# Patient Record
Sex: Female | Born: 1980
Health system: Southern US, Community
[De-identification: ages and names within clinical notes are randomized; demographics above are authoritative.]

## PROBLEM LIST (undated history)

## (undated) ENCOUNTER — Inpatient Hospital Stay (HOSPITAL_COMMUNITY): Payer: 59

## (undated) DIAGNOSIS — G8929 Other chronic pain: Secondary | ICD-10-CM

## (undated) DIAGNOSIS — F32A Depression, unspecified: Secondary | ICD-10-CM

## (undated) DIAGNOSIS — N883 Incompetence of cervix uteri: Secondary | ICD-10-CM

## (undated) DIAGNOSIS — M545 Low back pain: Secondary | ICD-10-CM

## (undated) DIAGNOSIS — G4733 Obstructive sleep apnea (adult) (pediatric): Secondary | ICD-10-CM

## (undated) DIAGNOSIS — L503 Dermatographic urticaria: Secondary | ICD-10-CM

## (undated) DIAGNOSIS — L709 Acne, unspecified: Secondary | ICD-10-CM

## (undated) DIAGNOSIS — R3129 Other microscopic hematuria: Secondary | ICD-10-CM

## (undated) DIAGNOSIS — F329 Major depressive disorder, single episode, unspecified: Secondary | ICD-10-CM

## (undated) DIAGNOSIS — Z6841 Body Mass Index (BMI) 40.0 and over, adult: Secondary | ICD-10-CM

## (undated) DIAGNOSIS — D509 Iron deficiency anemia, unspecified: Secondary | ICD-10-CM

## (undated) DIAGNOSIS — N840 Polyp of corpus uteri: Secondary | ICD-10-CM

## (undated) DIAGNOSIS — K219 Gastro-esophageal reflux disease without esophagitis: Secondary | ICD-10-CM

## (undated) DIAGNOSIS — B001 Herpesviral vesicular dermatitis: Secondary | ICD-10-CM

## (undated) DIAGNOSIS — J309 Allergic rhinitis, unspecified: Secondary | ICD-10-CM

## (undated) DIAGNOSIS — N979 Female infertility, unspecified: Secondary | ICD-10-CM

## (undated) DIAGNOSIS — H9193 Unspecified hearing loss, bilateral: Secondary | ICD-10-CM

## (undated) HISTORY — DX: Dermatographic urticaria: L50.3

## (undated) HISTORY — DX: Major depressive disorder, single episode, unspecified: F32.9

## (undated) HISTORY — DX: Morbid (severe) obesity due to excess calories: E66.01

## (undated) HISTORY — DX: Herpesviral vesicular dermatitis: B00.1

## (undated) HISTORY — DX: Low back pain: M54.5

## (undated) HISTORY — DX: Gastro-esophageal reflux disease without esophagitis: K21.9

## (undated) HISTORY — DX: Body Mass Index (BMI) 40.0 and over, adult: Z684

## (undated) HISTORY — DX: Acne, unspecified: L70.9

## (undated) HISTORY — DX: Other chronic pain: G89.29

## (undated) HISTORY — DX: Allergic rhinitis, unspecified: J30.9

## (undated) HISTORY — DX: Other microscopic hematuria: R31.29

---

## 2013-12-02 DIAGNOSIS — K219 Gastro-esophageal reflux disease without esophagitis: Secondary | ICD-10-CM | POA: Insufficient documentation

## 2013-12-02 DIAGNOSIS — B009 Herpesviral infection, unspecified: Secondary | ICD-10-CM | POA: Insufficient documentation

## 2013-12-02 HISTORY — DX: Gastro-esophageal reflux disease without esophagitis: K21.9

## 2014-11-25 DIAGNOSIS — F329 Major depressive disorder, single episode, unspecified: Secondary | ICD-10-CM

## 2014-11-25 DIAGNOSIS — F32A Depression, unspecified: Secondary | ICD-10-CM

## 2014-11-25 HISTORY — DX: Major depressive disorder, single episode, unspecified: F32.9

## 2014-11-25 HISTORY — DX: Depression, unspecified: F32.A

## 2016-11-02 DIAGNOSIS — J309 Allergic rhinitis, unspecified: Secondary | ICD-10-CM

## 2016-11-02 DIAGNOSIS — R3129 Other microscopic hematuria: Secondary | ICD-10-CM | POA: Insufficient documentation

## 2016-11-02 DIAGNOSIS — L503 Dermatographic urticaria: Secondary | ICD-10-CM

## 2016-11-02 DIAGNOSIS — E559 Vitamin D deficiency, unspecified: Secondary | ICD-10-CM | POA: Insufficient documentation

## 2016-11-02 DIAGNOSIS — Z30431 Encounter for routine checking of intrauterine contraceptive device: Secondary | ICD-10-CM | POA: Insufficient documentation

## 2016-11-02 DIAGNOSIS — J3089 Other allergic rhinitis: Secondary | ICD-10-CM | POA: Insufficient documentation

## 2016-11-02 DIAGNOSIS — Z6841 Body Mass Index (BMI) 40.0 and over, adult: Secondary | ICD-10-CM

## 2016-11-02 DIAGNOSIS — B001 Herpesviral vesicular dermatitis: Secondary | ICD-10-CM

## 2016-11-02 DIAGNOSIS — L709 Acne, unspecified: Secondary | ICD-10-CM

## 2016-11-02 HISTORY — DX: Other microscopic hematuria: R31.29

## 2016-11-02 HISTORY — DX: Herpesviral vesicular dermatitis: B00.1

## 2016-11-02 HISTORY — DX: Allergic rhinitis, unspecified: J30.9

## 2016-11-02 HISTORY — DX: Acne, unspecified: L70.9

## 2016-11-02 HISTORY — DX: Dermatographic urticaria: L50.3

## 2016-11-02 HISTORY — DX: Morbid (severe) obesity due to excess calories: E66.01

## 2017-01-03 DIAGNOSIS — G8929 Other chronic pain: Secondary | ICD-10-CM | POA: Insufficient documentation

## 2017-01-03 DIAGNOSIS — R0683 Snoring: Secondary | ICD-10-CM | POA: Insufficient documentation

## 2017-01-03 DIAGNOSIS — M545 Low back pain, unspecified: Secondary | ICD-10-CM

## 2017-01-03 HISTORY — DX: Low back pain, unspecified: M54.50

## 2018-03-06 DIAGNOSIS — M9901 Segmental and somatic dysfunction of cervical region: Secondary | ICD-10-CM | POA: Diagnosis not present

## 2018-03-06 DIAGNOSIS — M542 Cervicalgia: Secondary | ICD-10-CM | POA: Diagnosis not present

## 2018-03-06 DIAGNOSIS — M9903 Segmental and somatic dysfunction of lumbar region: Secondary | ICD-10-CM | POA: Diagnosis not present

## 2018-04-05 DIAGNOSIS — M9901 Segmental and somatic dysfunction of cervical region: Secondary | ICD-10-CM | POA: Diagnosis not present

## 2018-04-05 DIAGNOSIS — M542 Cervicalgia: Secondary | ICD-10-CM | POA: Diagnosis not present

## 2018-04-05 DIAGNOSIS — M9903 Segmental and somatic dysfunction of lumbar region: Secondary | ICD-10-CM | POA: Diagnosis not present

## 2018-07-02 ENCOUNTER — Ambulatory Visit (INDEPENDENT_AMBULATORY_CARE_PROVIDER_SITE_OTHER): Payer: 59 | Admitting: Osteopathic Medicine

## 2018-07-02 ENCOUNTER — Encounter: Payer: Self-pay | Admitting: Osteopathic Medicine

## 2018-07-02 VITALS — BP 141/73 | HR 59 | Temp 98.4°F | Ht 65.0 in | Wt 281.5 lb

## 2018-07-02 DIAGNOSIS — R21 Rash and other nonspecific skin eruption: Secondary | ICD-10-CM | POA: Diagnosis not present

## 2018-07-02 DIAGNOSIS — L83 Acanthosis nigricans: Secondary | ICD-10-CM

## 2018-07-02 DIAGNOSIS — E559 Vitamin D deficiency, unspecified: Secondary | ICD-10-CM | POA: Diagnosis not present

## 2018-07-02 DIAGNOSIS — R002 Palpitations: Secondary | ICD-10-CM | POA: Diagnosis not present

## 2018-07-02 DIAGNOSIS — B359 Dermatophytosis, unspecified: Secondary | ICD-10-CM

## 2018-07-02 DIAGNOSIS — M545 Low back pain, unspecified: Secondary | ICD-10-CM

## 2018-07-02 DIAGNOSIS — G8929 Other chronic pain: Secondary | ICD-10-CM

## 2018-07-02 DIAGNOSIS — Z6841 Body Mass Index (BMI) 40.0 and over, adult: Secondary | ICD-10-CM

## 2018-07-02 DIAGNOSIS — R030 Elevated blood-pressure reading, without diagnosis of hypertension: Secondary | ICD-10-CM

## 2018-07-02 DIAGNOSIS — Z803 Family history of malignant neoplasm of breast: Secondary | ICD-10-CM

## 2018-07-02 MED ORDER — FLUCONAZOLE 150 MG PO TABS: 150.0000 mg | ORAL_TABLET | ORAL | 0 refills | Status: DC

## 2018-07-02 NOTE — Addendum Note (Signed)
Addended by: Maryla Morrow on: 07/02/2018 05:14 PM   Modules accepted: Orders

## 2018-07-02 NOTE — Progress Notes (Signed)
HPI: Debra [name] is a 37 y.o. female who  has a past medical history of Acne (11/02/2016), Allergic rhinitis (11/02/2016), Chronic low back pain without sciatica (01/03/2017), Cold sore (11/02/2016), Depressive disorder (11/25/2014), Dermatographia (11/02/2016), Esophageal reflux (12/02/2013), Microscopic hematuria (11/02/2016), and Morbid obesity with BMI of 40.0-44.9, adult ([place]) (11/02/2016).  she presents to [facility] today, 07/02/18,  for chief complaint of: New to establish Rash concerns  Weight concerns    Palpitations:  Occasional heart racing for about 10-30 seconds, sometimes w/ exertion but not always and not w/ every exertional effort, no hx heart disease. No SOB/LOC, no dizziness. Happens maybe a few times per month.   Rash:  Dry, dark, scaly. On chest below breasts and back of neck, present for several months, not itchy or painful.   Weight gain:   Discuss options for management. Thinking of keto diet with her husband. Not too interested in meds at this time.   Skin on toe:  Cracking and raw. Better today than 3 weeks ago when she made the appt. On R pinky toe.   Brief review other medical history from patient and from records available:   IUD in place. Mirena placed around 04/2016. Planning on getting it taken out. G0. Planning for pregnancy. Married. Heterosexual cisgender.   (+)FH breast cancer in mother, diagnosed around 2008 at age 37. Unsure of genetics.   Hx depression: previously on Pristiq and liked this okay until insurance stopped covering it, also Wellbutrin, has been off this for years. Moods are good.   Allergies: taking Loratidine 30 mg  Cold sore: on Valtrex prn   GERD: taking Prevacid 30 mg   Snoring/Insomnia: no longer on ambien. Sleep study and neuro eval in 2018. Results borderline but she paid for her own CPAP and still using it and feels it's helping    Vit D Deficiency: would like this rechecked.    Back Pain: taking prn Soma, no longer on Flexeril, Mobic  Borderline BP: Looks like last visit with internal med / PCP was 01/2017. BP a bit elevated that visit 134/98. "Patient has been checking her blood pressure at home in the morning and in the evenings since her last visit and they have been averaging 117/80 8 in the morning and 126/90 in the evening. Most of her blood pressure readings are normal."  Last CMP ok 12/2016, CBC slightly elevated RBC, RDW, MPV, slightly decreased MCV, MCH. Vit D 29. UA contam, small blood no RBC.      Past medical, surgical, social and family history reviewed:  Patient Active Problem List   Diagnosis Date Noted  . Snoring 01/03/2017  . Chronic low back pain without sciatica 01/03/2017  . Vitamin D deficiency 11/02/2016  . Morbid obesity with BMI of 40.0-44.9, adult ([place]) 11/02/2016  . Microscopic hematuria 11/02/2016  . Dermatographia 11/02/2016  . Contraceptive, surveillance, intrauterine device 11/02/2016  . Cold sore 11/02/2016  . Allergic rhinitis 11/02/2016  . Acne 11/02/2016  . Depressive disorder 11/25/2014  . Herpes simplex 12/02/2013  . Esophageal reflux 12/02/2013   History reviewed. No pertinent surgical history.  Social History   Tobacco Use  . Smoking status: Never Smoker  . Smokeless tobacco: Never Used  Substance Use Topics  . Alcohol use: Yes    Comment: 1-5/WK    No family history on file.   Current medication list and allergy/intolerance information reviewed:    Current Outpatient Medications  Medication Sig Dispense Refill  . carisoprodol (SOMA) 250  MG tablet TK 1 T PO QID PRF 7 DAYS    . Cholecalciferol (VITAMIN D3 PO) Take by mouth.    . Ergocalciferol (VITAMIN D2 PO) Take by mouth.    . lansoprazole (PREVACID) 30 MG capsule Take by mouth.    . levonorgestrel (MIRENA, 52 MG,) 20 MCG/24HR IUD by Intrauterine route.    . valACYclovir (VALTREX) 1000 MG tablet Take by mouth.    Marland Kitchen    0   No current  facility-administered medications for this visit.     No Known Allergies    Review of Systems:  Constitutional:  No  fever, no chills, No recent illness, No unintentional weight changes. No significant fatigue. +night sweats on occasion,   HEENT: +headache, no vision change, no hearing change, No sore throat, No  sinus pressure  Cardiac: No  chest pain, No  pressure, +racing heart, No  Orthopnea  Respiratory:  No  shortness of breath. No  Cough  Gastrointestinal: No  abdominal pain, +heartburn, +nausea, No  vomiting,  No  blood in stool, No  diarrhea, No  constipation   Musculoskeletal: No new myalgia/arthralgia, +muscle/back/joint pain   Skin: +Rash, No other wounds/concerning lesions  Genitourinary: +incontinence, No  abnormal genital bleeding, No abnormal genital discharge  Hem/Onc: No  easy bruising/bleeding, No  abnormal lymph node  Endocrine: No cold intolerance,  +heat intolerance. No polyuria/polydipsia/polyphagia   Neurologic: No  weakness, No  dizziness  Psychiatric: No  concerns with depression at this time, No  concerns with anxiety, No sleep problems, No mood problems  Exam:  BP (!) 141/73 (BP Location: Left Arm, Patient Position: Sitting, Cuff Size: Large)   Pulse (!) 59   Temp 98.4 F (36.9 C) (Oral)   Ht '5\' 5"'$  (1.651 m)   Wt 281 lb 8 oz (127.7 kg)   BMI 46.84 kg/m   Constitutional: VS see above. General Appearance: alert, well-developed, well-nourished, NAD  Eyes: Normal lids and conjunctive, non-icteric sclera  Ears, Nose, Mouth, Throat: MMM, Normal external inspection ears/nares/mouth/lips/gums. TM normal bilaterally. Pharynx/tonsils no erythema, no exudate. Nasal mucosa normal.   Neck: No masses, trachea midline. No thyroid enlargement. No tenderness/mass appreciated. No lymphadenopathy  Respiratory: Normal respiratory effort. no wheeze, no rhonchi, no rales  Cardiovascular: S1/S2 normal, no murmur, no rub/gallop auscultated. RRR. No lower  extremity edema. Pedal pulse II/IV bilaterally DP and PT. No carotid bruit or JVD. No abdominal aortic bruit.  Gastrointestinal: Nontender, no masses. No hepatomegaly, no splenomegaly. No hernia appreciated. Bowel sounds normal. Rectal exam deferred.   Musculoskeletal: Gait normal. No clubbing/cyanosis of digits.   Neurological: Normal balance/coordination. No tremor. No cranial nerve deficit on limited exam. Motor and sensation intact and symmetric. Cerebellar reflexes intact.   Skin: warm, dry, intact. (+)dry patchy almost scaly rough slight hyperpigmentation below breasts and at nape of neck. No ulcer. No concerning nevi or subq nodules on limited exam.    Psychiatric: Normal judgment/insight. Normal mood and affect. Oriented x3.   EKG interpretation: Rate: 76 Rhythm: sinus but sinus arrhythmia No ST/T changes concerning for acute ischemia/infarct  Previous EKG none     ASSESSMENT/PLAN:   Rash and nonspecific skin eruption - Plan: fluconazole (DIFLUCAN) 150 MG tablet  Palpitations - sounds possible PVC or SVT runs, will get echo and holter unless labs give obvious cause   Vitamin D deficiency - Plan: VITAMIN D 25 Hydroxy (Vit-D Deficiency, Fractures)  BMI 45.0-49.9, adult (HCC) - Plan: CBC, COMPLETE METABOLIC PANEL WITH GFR, Lipid panel, TSH, Hemoglobin A1c  Chronic low back pain without sciatica, unspecified back pain laterality - recheck as needed   Tinea - Plan: fluconazole (DIFLUCAN) 150 MG tablet  Acanthosis nigricans - Plan: Hemoglobin A1c  Elevated blood pressure reading - better on recheck, will monitor     Patient Instructions   Ask mom about genetic testing for breast cancer - did she have testing for BRCA gene etc?   Bring home BP monitor to next visit   Try antifungal medications, babypowder to keep skin under breasts dry. If rash no better, will consider dermatology referral  See below for weight loss medications  Possible headache prevention  medications: amitriptyline, topiramate, propranolol, few others. Can also try daily magnesium supplement. Recommend Excedrin Migraine OTC for bad headache pain, no more than once or twice per week.    EKG looks ok, heart beat is a little irregular. Let's see how labs are looking - if all normal, would consider Holter monitor (48 hour heart rhythm monitoring) and echocardiogram (ultrasound of the heart)   Weight loss: important things to remember  It is hard work! You will have setbacks, but don't get discouraged. The goal is not short-term success, it is long-term health.   Looking at the numbers is important to track your progress and set goals, but how you are feeling and your overall health are the most important things! BMI and pounds and calories and miles logged aren't everything - they are tools to help [place] reach your goals.  You can do this!!!   Things to remember for exercise for weight loss:   Please note - I am not a certified personal trainer. I can present you with ideas and general workout goals, but an exercise program is largely up to you. Find something you can stick with, and something you enjoy!   As you progress in your exercise regimen think about gradually increasing the following, week by week:   intensity (how strenuous is your workout)  frequency (how often you are exercising)  duration (how many minutes at a time you are exercising)  Walking for 20 minutes a day is certainly better than nothing, but more strenuous exercise will develop better cardiovascular fitness.   interval training (high-intensity alternating with low-intensity, think walk/jog rather than just walk)  muscle strengthening exercises (weight lifting, calisthenics, yoga) - this also helps prevent osteoporosis!   Things to remember for diet changes for weight loss:   Please note - I am not a certified dietician. I can present you with ideas and general diet goals, but a meal plan is largely up  to you. I am happy to refer you to a dietician who can give you a detailed meal plan.  Apps/logs are crucial to track how you're eating! It's not realistic to be logging everything you eat forever, but when you're starting a healthy eating lifestyle it's very helpful, and checking in with logs now and then helps you stick to your program!   Calorie restriction with the goal weight loss of no more than one to one and a half pounds per week.   Increase lean protein such as chicken, fish, [place].   Decrease fatty foods such as dairy, butter.   Decrease sugary foods. Avoid sugary drinks such as soda or juice.  Increase fiber found in fruit and vegetables.   Medications approved for long-term use for obesity  Qsymia (Phentermine and Topiramate)  Saxenda (Liraglutide 3 mg/day)  Contrave (Bupropion and Naltrexone)  Lorcaserin (Belviq or Belviq XR)  Orlistat (Xenical,  Alli)  Bupropion (Wellbutrin) I recommend that you research the above medications and see which one(s) your insurance may or may not cover: If you call your insurance, ask them specifically what medications are on their formulary that are approved for obesity treatment. They should be able to send you a list or tell you over the phone. Remember, medications aren't magic! You MUST be diligent about lifestyle changes as well!      Visit summary with medication list and pertinent instructions was printed for patient to review. All questions at time of visit were answered - patient instructed to contact office with any additional concerns. ER/RTC precautions were reviewed with the patient.   Follow-up plan: Return for annual physical and review labs .  Note: Total time spent 45 minutes, greater than 50% of the visit was spent face-to-face counseling and coordinating care for the following: The primary encounter diagnosis was Rash and nonspecific skin eruption. Diagnoses of Palpitations, Vitamin D deficiency, BMI 45.0-49.9, adult  (HCC), Chronic low back pain without sciatica, unspecified back pain laterality, Tinea, Acanthosis nigricans, and Elevated blood pressure reading were also pertinent to this visit.Marland Kitchen  Please note: voice recognition software was used to produce this document, and typos may escape review. Please contact Dr. Sheppard Coil for any needed clarifications.

## 2018-07-02 NOTE — Patient Instructions (Addendum)
Ask mom about genetic testing for breast cancer - did she have testing for BRCA gene etc?   Bring home BP monitor to next visit   Try antifungal medications, babypowder to keep skin under breasts dry. If rash no better, will consider dermatology referral  See below for weight loss medications  Possible headache prevention medications: amitriptyline, topiramate, propranolol, few others. Can also try daily magnesium supplement. Recommend Excedrin Migraine OTC for bad headache pain, no more than once or twice per week.    EKG looks ok, heart beat is a little irregular. Let's see how labs are looking - if all normal, would consider Holter monitor (48 hour heart rhythm monitoring) and echocardiogram (ultrasound of the heart)   Weight loss: important things to remember  It is hard work! You will have setbacks, but don't get discouraged. The goal is not short-term success, it is long-term health.   Looking at the numbers is important to track your progress and set goals, but how you are feeling and your overall health are the most important things! BMI and pounds and calories and miles logged aren't everything - they are tools to help [place] reach your goals.  You can do this!!!   Things to remember for exercise for weight loss:   Please note - I am not a certified personal trainer. I can present you with ideas and general workout goals, but an exercise program is largely up to you. Find something you can stick with, and something you enjoy!   As you progress in your exercise regimen think about gradually increasing the following, week by week:   intensity (how strenuous is your workout)  frequency (how often you are exercising)  duration (how many minutes at a time you are exercising)  Walking for 20 minutes a day is certainly better than nothing, but more strenuous exercise will develop better cardiovascular fitness.   interval training (high-intensity alternating with low-intensity, think  walk/jog rather than just walk)  muscle strengthening exercises (weight lifting, calisthenics, yoga) - this also helps prevent osteoporosis!   Things to remember for diet changes for weight loss:   Please note - I am not a certified dietician. I can present you with ideas and general diet goals, but a meal plan is largely up to you. I am happy to refer you to a dietician who can give you a detailed meal plan.  Apps/logs are crucial to track how you're eating! It's not realistic to be logging everything you eat forever, but when you're starting a healthy eating lifestyle it's very helpful, and checking in with logs now and then helps you stick to your program!   Calorie restriction with the goal weight loss of no more than one to one and a half pounds per week.   Increase lean protein such as chicken, fish, [place].   Decrease fatty foods such as dairy, butter.   Decrease sugary foods. Avoid sugary drinks such as soda or juice.  Increase fiber found in fruit and vegetables.   Medications approved for long-term use for obesity  Qsymia (Phentermine and Topiramate)  Saxenda (Liraglutide 3 mg/day)  Contrave (Bupropion and Naltrexone)  Lorcaserin (Belviq or Belviq XR)  Orlistat (Xenical, Alli)  Bupropion (Wellbutrin) I recommend that you research the above medications and see which one(s) your insurance may or may not cover: If you call your insurance, ask them specifically what medications are on their formulary that are approved for obesity treatment. They should be able to send you  a list or tell you over the phone. Remember, medications aren't magic! You MUST be diligent about lifestyle changes as well!

## 2018-07-03 LAB — CBC
HEMATOCRIT: 47.4 % — AB (ref 35.0–45.0)
Hemoglobin: 15.2 g/dL (ref 11.7–15.5)
MCH: 26.4 pg — AB (ref 27.0–33.0)
MCHC: 32.1 g/dL (ref 32.0–36.0)
MCV: 82.4 fL (ref 80.0–100.0)
MPV: 12.3 fL (ref 7.5–12.5)
Platelets: 272 10*3/uL (ref 140–400)
RBC: 5.75 10*6/uL — ABNORMAL HIGH (ref 3.80–5.10)
RDW: 12.9 % (ref 11.0–15.0)
WBC: 7.5 10*3/uL (ref 3.8–10.8)

## 2018-07-03 LAB — LIPID PANEL
CHOL/HDL RATIO: 3.2 (calc) (ref <5.0)
Cholesterol: 174 mg/dL (ref <200)
HDL: 54 mg/dL (ref >50)
LDL Cholesterol (Calc): 99 mg/dL (calc)
NON-HDL CHOLESTEROL (CALC): 120 mg/dL (ref <130)
Triglycerides: 114 mg/dL (ref <150)

## 2018-07-03 LAB — TSH: TSH: 3.16 mIU/L

## 2018-07-03 LAB — COMPLETE METABOLIC PANEL WITH GFR
AG RATIO: 1.6 (calc) (ref 1.0–2.5)
ALT: 30 U/L — ABNORMAL HIGH (ref 6–29)
AST: 23 U/L (ref 10–30)
Albumin: 4.7 g/dL (ref 3.6–5.1)
Alkaline phosphatase (APISO): 69 U/L (ref 33–115)
BUN: 17 mg/dL (ref 7–25)
CO2: 23 mmol/L (ref 20–32)
CREATININE: 0.77 mg/dL (ref 0.50–1.10)
Calcium: 9.9 mg/dL (ref 8.6–10.2)
Chloride: 104 mmol/L (ref 98–110)
GFR, EST NON AFRICAN AMERICAN: 99 mL/min/{1.73_m2} (ref >=60)
GFR, Est African American: 115 mL/min/{1.73_m2} (ref >=60)
GLOBULIN: 2.9 g/dL (ref 1.9–3.7)
Glucose, Bld: 87 mg/dL (ref 65–99)
Potassium: 4.5 mmol/L (ref 3.5–5.3)
SODIUM: 139 mmol/L (ref 135–146)
Total Bilirubin: 0.4 mg/dL (ref 0.2–1.2)
Total Protein: 7.6 g/dL (ref 6.1–8.1)

## 2018-07-03 LAB — HEMOGLOBIN A1C
Hgb A1c MFr Bld: 5.4 % of total Hgb (ref <5.7)
MEAN PLASMA GLUCOSE: 108 (calc)
eAG (mmol/L): 6 (calc)

## 2018-07-03 LAB — VITAMIN D 25 HYDROXY (VIT D DEFICIENCY, FRACTURES): VIT D 25 HYDROXY: 59 ng/mL (ref 30–100)

## 2018-07-03 NOTE — Addendum Note (Signed)
Addended by: Mertha Finders on: 07/03/2018 12:10 PM   Modules accepted: Orders

## 2018-07-13 ENCOUNTER — Ambulatory Visit (INDEPENDENT_AMBULATORY_CARE_PROVIDER_SITE_OTHER): Payer: 59

## 2018-07-13 DIAGNOSIS — Z1231 Encounter for screening mammogram for malignant neoplasm of breast: Secondary | ICD-10-CM

## 2018-07-17 ENCOUNTER — Encounter: Payer: Self-pay | Admitting: Osteopathic Medicine

## 2018-07-17 ENCOUNTER — Ambulatory Visit (INDEPENDENT_AMBULATORY_CARE_PROVIDER_SITE_OTHER): Payer: 59 | Admitting: Osteopathic Medicine

## 2018-07-17 VITALS — BP 122/81 | HR 63 | Temp 98.2°F | Wt 275.2 lb

## 2018-07-17 DIAGNOSIS — R7401 Elevation of levels of liver transaminase levels: Secondary | ICD-10-CM

## 2018-07-17 DIAGNOSIS — Z6841 Body Mass Index (BMI) 40.0 and over, adult: Secondary | ICD-10-CM

## 2018-07-17 DIAGNOSIS — G4700 Insomnia, unspecified: Secondary | ICD-10-CM

## 2018-07-17 DIAGNOSIS — E559 Vitamin D deficiency, unspecified: Secondary | ICD-10-CM

## 2018-07-17 DIAGNOSIS — Z0000 Encounter for general adult medical examination without abnormal findings: Secondary | ICD-10-CM | POA: Diagnosis not present

## 2018-07-17 DIAGNOSIS — R740 Nonspecific elevation of levels of transaminase and lactic acid dehydrogenase [LDH]: Secondary | ICD-10-CM

## 2018-07-17 MED ORDER — TRAZODONE HCL 50 MG PO TABS: 25.0000 mg | ORAL_TABLET | Freq: Every evening | ORAL | 3 refills | Status: DC | PRN

## 2018-07-17 NOTE — Patient Instructions (Addendum)
Review preventive care:   General Preventive Care  Most recent routine screening lipids/other labs:   Tobacco: don't! Alcohol: moderation is ok for most people. Recreational/Illicit Drugs: don't!  Exercise: as tolerated to reduce risk of cardiovascular disease and diabetes  Mental health: if need for mental health care (medicines, counseling, other), or concerns about moods, please let me know!   Sexual health: if need for pregnancy prevention or STD testing, or if libido/pain problems, please let me know!   Vaccines  Flu vaccine: recommended every fall (by Halloween!)  Tetanus booster: Tdap recommended every 10 years  Cancer screenings   Colon cancer screening: recommended at age 32, colonoscopy sooner if risk factors   Breast cancer screening: mammogram recommended at age 41  Cervical cancer screening: every 1 to 5 years depending on age and other risk factors. Can stop at age 33 or w/ hysterectomy as long as previous testing was normal.   Infection screenings . HIV: recommended screening at least once age 31-65, more often if risk factors  . Gonorrhea/Chlamydia: many insurances require testing for anyone on birth control pills, otherwise screening as needed . Hepatitis C: recommended for anyone born 94-1964  Other . Bone Density Test: recommended for women at age 5, men at age 75, sooner depending on risk factors . Advanced Directive: Living Will and/or Healthcare Power of Attorney recommended for everyone, regardless of age or health

## 2018-07-17 NOTE — Progress Notes (Signed)
HPI: Debra [name] is a 37 y.o. female who  has a past medical history of Acne (11/02/2016), Allergic rhinitis (11/02/2016), Chronic low back pain without sciatica (01/03/2017), Cold sore (11/02/2016), Depressive disorder (11/25/2014), Dermatographia (11/02/2016), Esophageal reflux (12/02/2013), Microscopic hematuria (11/02/2016), and Morbid obesity with BMI of 40.0-44.9, adult ([place]) (11/02/2016).  she presents to [facility] today, 07/17/18,  for chief complaint of: Annual physical   Patient here for annual physical / wellness exam.  See preventive care reviewed as below.  Recent labs reviewed in detail with the patient.   Additional concerns today include:  Some difficulty sleeping on occasion.     Past medical, surgical, social and family history reviewed:  Patient Active Problem List   Diagnosis Date Noted  . Snoring 01/03/2017  . Chronic low back pain without sciatica 01/03/2017  . Vitamin D deficiency 11/02/2016  . Morbid obesity with BMI of 40.0-44.9, adult ([name]) 11/02/2016  . Microscopic hematuria 11/02/2016  . Dermatographia 11/02/2016  . Contraceptive, surveillance, intrauterine device 11/02/2016  . Cold sore 11/02/2016  . Allergic rhinitis 11/02/2016  . Acne 11/02/2016  . Depressive disorder 11/25/2014  . Herpes simplex 12/02/2013  . Esophageal reflux 12/02/2013    No past surgical history on file.  Social History   Tobacco Use  . Smoking status: Never Smoker  . Smokeless tobacco: Never Used  Substance Use Topics  . Alcohol use: Yes    Comment: 1-5/WK    Family History  Problem Relation Age of Onset  . Breast cancer Mother      Current medication list and allergy/intolerance information reviewed:    Current Outpatient Medications  Medication Sig Dispense Refill  . carisoprodol (SOMA) 250 MG tablet TK 1 T PO QID PRF 7 DAYS    . Cholecalciferol (VITAMIN D3 PO) Take by mouth.    . Ergocalciferol (VITAMIN D2 PO) Take  by mouth.    . fluconazole (DIFLUCAN) 150 MG tablet Take 1 tablet (150 mg total) by mouth once a week. For 2-4 weeks 4 tablet 0  . lansoprazole (PREVACID) 30 MG capsule Take by mouth.    . levonorgestrel (MIRENA, 52 MG,) 20 MCG/24HR IUD by Intrauterine route.    . valACYclovir (VALTREX) 1000 MG tablet Take by mouth.     No current facility-administered medications for this visit.     No Known Allergies    Review of Systems:  Constitutional:  No  fever, no chills, No recent illness, No unintentional weight changes. No significant fatigue.   HEENT: No  headache, no vision change, no hearing change, No sore throat, No  sinus pressure  Cardiac: No  chest pain, No  pressure, No palpitations, No  Orthopnea  Respiratory:  No  shortness of breath. No  Cough  Gastrointestinal: No  abdominal pain, No  nausea, No  vomiting,  No  blood in stool, No  diarrhea, No  constipation   Musculoskeletal: No new myalgia/arthralgia  Skin: No  Rash, No other wounds/concerning lesions  Genitourinary: No  incontinence, No  abnormal genital bleeding, No abnormal genital discharge  Hem/Onc: No  easy bruising/bleeding, No  abnormal lymph node  Endocrine: No cold intolerance,  No heat intolerance. No polyuria/polydipsia/polyphagia   Neurologic: No  weakness, No  dizziness, No  slurred speech/focal weakness/facial droop  Psychiatric: No  concerns with depression, No  concerns with anxiety, +sleep problems, No mood problems  Exam:  BP 122/81 (BP Location: Left Arm, Patient Position: Sitting, Cuff Size: Large)   Pulse 63  Temp 98.2 F (36.8 C) (Oral)   Wt 275 lb 3.2 oz (124.8 kg)   BMI 45.80 kg/m   Constitutional: VS see above. General Appearance: alert, well-developed, well-nourished, NAD  Eyes: Normal lids and conjunctive, non-icteric sclera  Ears, Nose, Mouth, Throat: MMM, Normal external inspection ears/nares/mouth/lips/gums. TM normal bilaterally. Pharynx/tonsils no erythema, no exudate. Nasal  mucosa normal.   Neck: No masses, trachea midline. No thyroid enlargement. No tenderness/mass appreciated. No lymphadenopathy  Respiratory: Normal respiratory effort. no wheeze, no rhonchi, no rales  Cardiovascular: S1/S2 normal, no murmur, no rub/gallop auscultated. RRR. No lower extremity edema. Pedal pulse II/IV bilaterally DP and PT.   Gastrointestinal: Nontender, no masses. No hepatomegaly, no splenomegaly. No hernia appreciated. Bowel sounds normal. Rectal exam deferred.   Musculoskeletal: Gait normal. No clubbing/cyanosis of digits.   Neurological: Normal balance/coordination. No tremor. No cranial nerve deficit on limited exam. Motor and sensation intact and symmetric. Cerebellar reflexes intact.   Skin: warm, dry, intact. No rash/ulcer. No concerning nevi or subq nodules on limited exam.    Psychiatric: Normal judgment/insight. Normal mood and affect. Oriented x3.    Recent Results (from the past 2160 hour(s))  CBC     Status: Abnormal   Collection Time: 07/02/18 12:12 PM  Result Value Ref Range   WBC 7.5 3.8 - 10.8 Thousand/uL   RBC 5.75 (H) 3.80 - 5.10 Million/uL   Hemoglobin 15.2 11.7 - 15.5 g/dL   HCT 47.4 (H) 35.0 - 45.0 %   MCV 82.4 80.0 - 100.0 fL   MCH 26.4 (L) 27.0 - 33.0 pg   MCHC 32.1 32.0 - 36.0 g/dL   RDW 12.9 11.0 - 15.0 %   Platelets 272 140 - 400 Thousand/uL   MPV 12.3 7.5 - 12.5 fL  COMPLETE METABOLIC PANEL WITH GFR     Status: Abnormal   Collection Time: 07/02/18 12:12 PM  Result Value Ref Range   Glucose, Bld 87 65 - 99 mg/dL    Comment: .            Fasting reference interval .    BUN 17 7 - 25 mg/dL   Creat 0.77 0.50 - 1.10 mg/dL   GFR, Est Non African American 99 > OR = 60 mL/min/1.33m2   GFR, Est African American 115 > OR = 60 mL/min/1.40m2   BUN/Creatinine Ratio NOT APPLICABLE 6 - 22 (calc)   Sodium 139 135 - 146 mmol/L   Potassium 4.5 3.5 - 5.3 mmol/L   Chloride 104 98 - 110 mmol/L   CO2 23 20 - 32 mmol/L   Calcium 9.9 8.6 - 10.2  mg/dL   Total Protein 7.6 6.1 - 8.1 g/dL   Albumin 4.7 3.6 - 5.1 g/dL   Globulin 2.9 1.9 - 3.7 g/dL (calc)   AG Ratio 1.6 1.0 - 2.5 (calc)   Total Bilirubin 0.4 0.2 - 1.2 mg/dL   Alkaline phosphatase (APISO) 69 33 - 115 U/L   AST 23 10 - 30 U/L   ALT 30 (H) 6 - 29 U/L  Lipid panel     Status: None   Collection Time: 07/02/18 12:12 PM  Result Value Ref Range   Cholesterol 174 <200 mg/dL   HDL 54 >50 mg/dL   Triglycerides 114 <150 mg/dL   LDL Cholesterol (Calc) 99 mg/dL (calc)    Comment: Reference range: <100 . Desirable range <100 mg/dL for primary prevention;   <70 mg/dL for patients with CHD or diabetic patients  with > or = 2 CHD risk  factors. Marland Kitchen LDL-C is now calculated using the Martin-Hopkins  calculation, which is a validated novel method providing  better accuracy than the Friedewald equation in the  estimation of LDL-C.  Cresenciano Genre et al. Annamaria Helling. 3151;761(60): 2061-2068  (http://education.[facility].com/faq/FAQ164)    Total CHOL/HDL Ratio 3.2 <5.0 (calc)   Non-HDL Cholesterol (Calc) 120 <130 mg/dL (calc)    Comment: For patients with diabetes plus 1 major ASCVD risk  factor, treating to a non-HDL-C goal of <100 mg/dL  (LDL-C of <70 mg/dL) is considered a therapeutic  option.   TSH     Status: None   Collection Time: 07/02/18 12:12 PM  Result Value Ref Range   TSH 3.16 mIU/L    Comment:           Reference Range .           > or = 20 Years  0.40-4.50 .                Pregnancy Ranges           First trimester    0.26-2.66           Second trimester   0.55-2.73           Third trimester    0.43-2.91   VITAMIN D 25 Hydroxy (Vit-D Deficiency, Fractures)     Status: None   Collection Time: 07/02/18 12:12 PM  Result Value Ref Range   Vit D, 25-Hydroxy 59 30 - 100 ng/mL    Comment: Vitamin D Status         25-OH Vitamin D: . Deficiency:                    <20 ng/mL Insufficiency:             20 - 29 ng/mL Optimal:                 > or = 30 ng/mL . For  25-OH Vitamin D testing on patients on  D2-supplementation and patients for whom quantitation  of D2 and D3 fractions is required, the QuestAssureD(TM) 25-OH VIT D, (D2,D3), LC/MS/MS is recommended: order  code [phone] (patients >76yrs). . For more information on this test, go to: http://education.[facility].com/faq/FAQ163 (This link is being provided for  informational/educational purposes only.)   Hemoglobin A1c     Status: None   Collection Time: 07/02/18 12:12 PM  Result Value Ref Range   Hgb A1c MFr Bld 5.4 <5.7 % of total Hgb    Comment: For the purpose of screening for the presence of diabetes: . <5.7%       Consistent with the absence of diabetes 5.7-6.4%    Consistent with increased risk for diabetes             (prediabetes) > or =6.5%  Consistent with diabetes . This assay result is consistent with a decreased risk of diabetes. . Currently, no consensus exists regarding use of hemoglobin A1c for diagnosis of diabetes in children. . According to American Diabetes Association (ADA) guidelines, hemoglobin A1c <7.0% represents optimal control in non-pregnant diabetic patients. Different metrics may apply to specific patient populations.  Standards of Medical Care in Diabetes(ADA). .    Mean Plasma Glucose 108 (calc)   eAG (mmol/L) 6.0 (calc)    Mm 3d Screen Breast Bilateral  Result Date: 07/16/2018 CLINICAL DATA:  Screening. EXAM: DIGITAL SCREENING BILATERAL MAMMOGRAM WITH TOMO AND CAD COMPARISON:  None. ACR Breast Density Category b: There are scattered areas  of fibroglandular density. FINDINGS: There are no findings suspicious for malignancy. Images were processed with CAD. IMPRESSION: No mammographic evidence of malignancy. A result letter of this screening mammogram will be mailed directly to the patient. RECOMMENDATION: Screening mammogram at age 4. (Code:[id number]) BI-RADS CATEGORY  1: Negative. Electronically Signed   By: Lovey Newcomer M.D.   On: 07/16/2018  12:14     ASSESSMENT/PLAN:   Annual physical exam  Vitamin D deficiency - Plan: VITAMIN D 25 Hydroxy (Vit-D Deficiency, Fractures)  BMI 45.0-49.9, adult ([place]) - Labs ordered for future visit, patient would like to keep an eye on abnormalities - Plan: Hemoglobin A1c, COMPLETE METABOLIC PANEL WITH GFR, VITAMIN D 25 Hydroxy (Vit-D Deficiency, Fractures)  Elevated ALT measurement - Minor, will recheck sometime this year  Insomnia, unspecified type - Trial trazodone    Patient Instructions  Review preventive care:   General Preventive Care  Most recent routine screening lipids/other labs:   Tobacco: don't! Alcohol: moderation is ok for most people. Recreational/Illicit Drugs: don't!  Exercise: as tolerated to reduce risk of cardiovascular disease and diabetes  Mental health: if need for mental health care (medicines, counseling, other), or concerns about moods, please let me know!   Sexual health: if need for pregnancy prevention or STD testing, or if libido/pain problems, please let me know!   Vaccines  Flu vaccine: recommended every fall (by Halloween!)  Tetanus booster: Tdap recommended every 10 years  Cancer screenings   Colon cancer screening: recommended at age 68, colonoscopy sooner if risk factors   Breast cancer screening: mammogram recommended at age 65  Cervical cancer screening: every 1 to 5 years depending on age and other risk factors. Can stop at age 54 or w/ hysterectomy as long as previous testing was normal.   Infection screenings . HIV: recommended screening at least once age 82-65, more often if risk factors  . Gonorrhea/Chlamydia: many insurances require testing for anyone on birth control pills, otherwise screening as needed . Hepatitis C: recommended for anyone born 60-1964  Other . Bone Density Test: recommended for women at age 40, men at age 21, sooner depending on risk factors . Advanced Directive: Living Will and/or Healthcare Power of  Attorney recommended for everyone, regardless of age or health         Visit summary with medication list and pertinent instructions was printed for patient to review. All questions at time of visit were answered - patient instructed to contact office with any additional concerns. ER/RTC precautions were reviewed with the patient.   Follow-up plan: Return in about 5 months (around 12/17/2018) for recheck labs - lab visit prior to appt w/ Dr A - sooner if needed .   Please note: voice recognition software was used to produce this document, and typos may escape review. Please contact Dr. Sheppard Coil for any needed clarifications.

## 2018-07-18 ENCOUNTER — Encounter: Payer: Self-pay | Admitting: Osteopathic Medicine

## 2018-07-18 DIAGNOSIS — R7401 Elevation of levels of liver transaminase levels: Secondary | ICD-10-CM | POA: Insufficient documentation

## 2018-07-18 DIAGNOSIS — G4700 Insomnia, unspecified: Secondary | ICD-10-CM | POA: Insufficient documentation

## 2018-07-18 DIAGNOSIS — R740 Nonspecific elevation of levels of transaminase and lactic acid dehydrogenase [LDH]: Secondary | ICD-10-CM

## 2018-08-17 ENCOUNTER — Telehealth: Payer: Self-pay

## 2018-08-17 DIAGNOSIS — Z0184 Encounter for antibody response examination: Secondary | ICD-10-CM

## 2018-08-17 NOTE — Telephone Encounter (Signed)
As per pt, she never completed her MMR series. As a child, she only rec'd 2 out of the 3 injections due to a reaction after getting the vaccine. She got sick/had some sort of a rash. Pt is concern due to the recent outbreak of measles. She wants to know if you recommend her to get a MMR booster or is pt "immunized" with the vaccines she got as a child. Pls advise, thanks.

## 2018-08-18 NOTE — Telephone Encounter (Signed)
I placed an order for her to get titers drawn to measure antibody levels and determine immunity status.  She can go to the lab to get these drawn.  If antibody levels are deficient, can consider repeat MMR vaccine.

## 2018-08-21 NOTE — Telephone Encounter (Signed)
Left a vm msg for pt regarding provider's note. Call back information provided.

## 2018-08-22 DIAGNOSIS — Z0184 Encounter for antibody response examination: Secondary | ICD-10-CM | POA: Diagnosis not present

## 2018-08-23 LAB — MEASLES/MUMPS/RUBELLA IMMUNITY
Mumps IgG: 85.4 AU/mL
Rubella: 1.25 index

## 2018-08-27 ENCOUNTER — Ambulatory Visit (INDEPENDENT_AMBULATORY_CARE_PROVIDER_SITE_OTHER): Payer: 59 | Admitting: Osteopathic Medicine

## 2018-08-27 VITALS — BP 130/63 | HR 67 | Temp 97.7°F

## 2018-08-27 DIAGNOSIS — Z23 Encounter for immunization: Secondary | ICD-10-CM | POA: Diagnosis not present

## 2018-08-27 NOTE — Progress Notes (Signed)
Pt came into clinic today for MMR vaccine, based on last titers she needs one booster shot. Pt tolerated injection in right arm well, no immediate complications. No further follow up needed.

## 2018-10-26 ENCOUNTER — Ambulatory Visit: Payer: 59 | Admitting: Osteopathic Medicine

## 2018-11-05 ENCOUNTER — Telehealth: Payer: Self-pay

## 2018-11-05 DIAGNOSIS — M545 Low back pain: Secondary | ICD-10-CM | POA: Diagnosis not present

## 2018-11-05 MED ORDER — CYCLOBENZAPRINE HCL 10 MG PO TABS: 5.0000 mg | ORAL_TABLET | Freq: Three times a day (TID) | ORAL | 1 refills | Status: DC | PRN

## 2018-11-05 NOTE — Telephone Encounter (Signed)
Sent Rx If not helping, will need follow up with probably sports med!

## 2018-11-05 NOTE — Telephone Encounter (Signed)
Pt left a vm msg requesting feed back from provider. As per pt, she pulled a muscle in her neck. She has an upcoming appt this Friday w/ Physical therapist. Wants to know if it is appropriate to request a rx for muscle relaxant. If so, pls send rx to [facility]. Pls advise, thanks.

## 2018-11-06 NOTE — Telephone Encounter (Signed)
Pt advised.

## 2018-11-09 DIAGNOSIS — Z01411 Encounter for gynecological examination (general) (routine) with abnormal findings: Secondary | ICD-10-CM | POA: Diagnosis not present

## 2018-11-09 DIAGNOSIS — Z30432 Encounter for removal of intrauterine contraceptive device: Secondary | ICD-10-CM | POA: Diagnosis not present

## 2018-11-09 DIAGNOSIS — Z01419 Encounter for gynecological examination (general) (routine) without abnormal findings: Secondary | ICD-10-CM | POA: Diagnosis not present

## 2018-11-09 DIAGNOSIS — R87610 Atypical squamous cells of undetermined significance on cytologic smear of cervix (ASC-US): Secondary | ICD-10-CM | POA: Diagnosis not present

## 2018-12-14 ENCOUNTER — Ambulatory Visit: Payer: 59 | Admitting: Osteopathic Medicine

## 2018-12-17 ENCOUNTER — Ambulatory Visit: Payer: 59 | Admitting: Osteopathic Medicine

## 2018-12-28 DIAGNOSIS — Z3202 Encounter for pregnancy test, result negative: Secondary | ICD-10-CM | POA: Diagnosis not present

## 2018-12-28 DIAGNOSIS — R87610 Atypical squamous cells of undetermined significance on cytologic smear of cervix (ASC-US): Secondary | ICD-10-CM | POA: Diagnosis not present

## 2018-12-28 DIAGNOSIS — N72 Inflammatory disease of cervix uteri: Secondary | ICD-10-CM | POA: Diagnosis not present

## 2019-01-11 ENCOUNTER — Telehealth: Payer: Self-pay

## 2019-01-11 MED ORDER — FLUTICASONE PROPIONATE 50 MCG/ACT NA SUSP: NASAL | 3 refills | Status: DC

## 2019-01-11 MED ORDER — VALACYCLOVIR HCL 1 G PO TABS: 2000.0000 mg | ORAL_TABLET | Freq: Two times a day (BID) | ORAL | 1 refills | Status: AC

## 2019-01-11 NOTE — Telephone Encounter (Signed)
Left a detailed vm msg for pt regarding med refills sent to local pharmacy. Call back info provided.

## 2019-01-11 NOTE — Telephone Encounter (Signed)
Sent!

## 2019-01-11 NOTE — Telephone Encounter (Signed)
Pt called requesting requesting rxs for valacyclovir for cold sore out breaks and flonase. Valacyclovir rx written by historical provider and flonase rx is a new rx. Requesting for rxs to be sent to [facility] drug store. Pls advise, thanks.

## 2019-02-21 DIAGNOSIS — Z719 Counseling, unspecified: Secondary | ICD-10-CM | POA: Diagnosis not present

## 2019-02-28 DIAGNOSIS — Z719 Counseling, unspecified: Secondary | ICD-10-CM | POA: Diagnosis not present

## 2019-03-07 DIAGNOSIS — Z719 Counseling, unspecified: Secondary | ICD-10-CM | POA: Diagnosis not present

## 2019-03-14 DIAGNOSIS — Z719 Counseling, unspecified: Secondary | ICD-10-CM | POA: Diagnosis not present

## 2019-03-21 DIAGNOSIS — Z719 Counseling, unspecified: Secondary | ICD-10-CM | POA: Diagnosis not present

## 2019-03-28 DIAGNOSIS — Z719 Counseling, unspecified: Secondary | ICD-10-CM | POA: Diagnosis not present

## 2019-04-04 DIAGNOSIS — Z719 Counseling, unspecified: Secondary | ICD-10-CM | POA: Diagnosis not present

## 2019-04-15 DIAGNOSIS — Z719 Counseling, unspecified: Secondary | ICD-10-CM | POA: Diagnosis not present

## 2019-04-18 DIAGNOSIS — Z719 Counseling, unspecified: Secondary | ICD-10-CM | POA: Diagnosis not present

## 2019-04-25 DIAGNOSIS — Z719 Counseling, unspecified: Secondary | ICD-10-CM | POA: Diagnosis not present

## 2019-05-07 DIAGNOSIS — Z719 Counseling, unspecified: Secondary | ICD-10-CM | POA: Diagnosis not present

## 2019-05-09 DIAGNOSIS — Z719 Counseling, unspecified: Secondary | ICD-10-CM | POA: Diagnosis not present

## 2019-05-10 ENCOUNTER — Encounter: Payer: Self-pay | Admitting: Family Medicine

## 2019-05-10 ENCOUNTER — Ambulatory Visit (INDEPENDENT_AMBULATORY_CARE_PROVIDER_SITE_OTHER): Payer: 59 | Admitting: Family Medicine

## 2019-05-10 VITALS — BP 138/108 | HR 98 | Ht 65.0 in | Wt 250.0 lb

## 2019-05-10 DIAGNOSIS — L0100 Impetigo, unspecified: Secondary | ICD-10-CM

## 2019-05-10 DIAGNOSIS — B002 Herpesviral gingivostomatitis and pharyngotonsillitis: Secondary | ICD-10-CM | POA: Diagnosis not present

## 2019-05-10 DIAGNOSIS — L237 Allergic contact dermatitis due to plants, except food: Secondary | ICD-10-CM

## 2019-05-10 MED ORDER — VALACYCLOVIR HCL 1 G PO TABS: 2000.0000 mg | ORAL_TABLET | Freq: Two times a day (BID) | ORAL | 12 refills | Status: DC

## 2019-05-10 MED ORDER — TRIAMCINOLONE ACETONIDE 0.5 % EX CREA: 1.0000 "application " | TOPICAL_CREAM | Freq: Two times a day (BID) | CUTANEOUS | 3 refills | Status: DC

## 2019-05-10 MED ORDER — MUPIROCIN 2 % EX OINT: TOPICAL_OINTMENT | CUTANEOUS | 3 refills | Status: DC

## 2019-05-10 NOTE — Patient Instructions (Signed)
Thank you for coming in today.  I think you have poison ivy.  Use the triamcinolone cream twice daily until better.   I also think you have impetigo which is a superficial skin infection.  Use the mupirocin antibiotic cream 2x daily until better.  Use for gold colored crusty lesions on skin.   Poison Ivy Dermatitis  Poison ivy dermatitis is inflammation of the skin that is caused by the allergens on the leaves of the poison ivy plant. The skin reaction often involves redness, swelling, blisters, and extreme itching. What are the causes? This condition is caused by a specific chemical (urushiol) found in the sap of the poison ivy plant. This chemical is sticky and can be easily spread to people, animals, and objects. You can get poison ivy dermatitis by:  Having direct contact with a poison ivy plant.  Touching animals, other people, or objects that have come in contact with poison ivy and have the chemical on them. What increases the risk? This condition is more likely to develop in:  People who are outdoors often.  People who go outdoors without wearing protective clothing, such as closed shoes, long pants, and a long-sleeved shirt. What are the signs or symptoms? Symptoms of this condition include:  Redness and itching.  A rash that often includes bumps and blisters. The rash usually appears 48 hours after exposure.  Swelling. This may occur if the reaction is more severe. Symptoms usually last for 1-2 weeks. However, the first time you develop this condition, symptoms may last 3-4 weeks. How is this diagnosed? This condition may be diagnosed based on your symptoms and a physical exam. Your health care provider may also ask you about any recent outdoor activity. How is this treated? Treatment for this condition will vary depending on how severe it is. Treatment may include:  Hydrocortisone creams or calamine lotions to relieve itching.  Oatmeal baths to soothe the skin.   Over-the-counter antihistamine tablets.  Oral steroid medicine for more severe outbreaks. Follow these instructions at home:  Take or apply over-the-counter and prescription medicines only as told by your health care provider.  Wash exposed skin as soon as possible with soap and cold water.  Use hydrocortisone creams or calamine lotion as needed to soothe the skin and relieve itching.  Take oatmeal baths as needed. Use colloidal oatmeal. You can get this at your local pharmacy or grocery store. Follow the instructions on the packaging.  Do not scratch or rub your skin.  While you have the rash, wash clothes right after you wear them. How is this prevented?   Learn to identify the poison ivy plant and avoid contact with the plant. This plant can be recognized by the number of leaves. Generally, poison ivy has three leaves with flowering branches on a single stem. The leaves are typically glossy, and they have jagged edges that come to a point at the front.  If you have been exposed to poison ivy, thoroughly wash with soap and water right away. You have about 30 minutes to remove the plant resin before it will cause the rash. Be sure to wash under your fingernails because any plant resin there will continue to spread the rash.  When hiking or camping, wear clothes that will help you to avoid exposure on the skin. This includes long pants, a long-sleeved shirt, tall socks, and hiking boots. You can also apply preventive lotion to your skin to help limit exposure.  If you suspect that your  clothes or outdoor gear came in contact with poison ivy, rinse them off outside with a garden hose before you bring them inside your house. Contact a health care provider if:  You have open sores in the rash area.  You have more redness, swelling, or pain in the affected area.  You have redness that spreads beyond the rash area.  You have fluid, blood, or pus coming from the affected area.  You have  a fever.  You have a rash over a large area of your body.  You have a rash on your eyes, mouth, or genitals.  Your rash does not improve after a few days. Get help right away if:  Your face swells or your eyes swell shut.  You have trouble breathing.  You have trouble swallowing. This information is not intended to replace advice given to you by your health care provider. Make sure you discuss any questions you have with your health care provider. Document Released: 12/02/2000 Document Revised: 05/18/2017 Document Reviewed: 05/13/2015 Elsevier Interactive Patient Education  2019 [place], Adult Impetigo is an infection of the skin. It commonly occurs in young children, but it can also occur in adults. The infection causes itchy blisters and sores that produce brownish-yellow fluid. As the fluid dries, it forms a thick, honey-colored crust. These skin changes usually occur on the face, but they can also affect other areas of the body. Impetigo usually goes away in 7-10 days with treatment. What are the causes? This condition is caused by two types of bacteria. It may be caused by staphylococci or streptococci bacteria. These bacteria cause impetigo when they get under the surface of the skin. This often happens after some damage to the skin, such as:  Cuts, scrapes, or scratches.  Rashes.  Insect bites, especially when you scratch the area of a bite.  Chickenpox or other illnesses that cause open skin sores.  Nail biting or chewing. Impetigo can spread easily from one person to another (is contagious). It may be spread through close skin contact or by sharing towels, clothing, or other items that an infected person has touched. What increases the risk? The following factors may make you more likely to develop this condition:  Playing sports that include skin-to-skin contact with others.  Having a skin condition with open sores, such as chickenpox.  Having  diabetes.  Having a weak body defense system (immune system).  Having many skin cuts or scrapes.  Living in an area that has high humidity levels.  Having poor hygiene.  Having high levels of staphylococci in your nose. What are the signs or symptoms? The main symptom of this condition is small blisters, often on the face around the mouth and nose. In time, the blisters break open and turn into tiny sores (lesions) with a yellow crust. In some cases, the blisters cause itching or burning. With scratching, irritation, or lack of treatment, these small lesions may get larger. Other possible symptoms include:  Larger blisters.  Pus.  Swollen lymph glands. Scratching the affected area can cause impetigo to spread to other parts of the body. The bacteria can get under the fingernails and spread when you touch another area of your skin. How is this diagnosed? This condition is usually diagnosed during a physical exam. A skin sample or a sample of fluid from a blister may be taken for lab tests that involve growing bacteria (culture test). Lab tests can help to confirm the diagnosis or help  to determine the best treatment. How is this treated? Treatment for this condition depends on the severity of the condition:  Mild impetigo can be treated with prescription antibiotic cream.  Oral antibiotic medicine may be used in more severe cases.  Medicines that reduce itchiness (antihistamines)may also be used. Follow these instructions at home: Medicines  Take over-the-counter and prescription medicines only as told by your health care provider.  Apply or take your antibiotic as told by your health care provider. Do not stop using the antibiotic even if your condition improves. General instructions   To help prevent impetigo from spreading to other body areas: ? Keep your fingernails short and clean. ? Do not scratch the blisters or sores. ? Cover infected areas, if necessary, to keep  from scratching. ? Wash your hands often with soap and warm water.  Before applying antibiotic cream or ointment, you should: ? Gently wash the infected areas with antibacterial soap and warm water. ? Soak crusted areas in warm, soapy water using antibacterial soap. ? Gently rub the areas to remove crusts. Do not scrub.  Do not share towels.  Wash your clothing and bedsheets in warm water that is 140F (60C) or warmer.  Stay home until you have used an antibiotic cream for 48 hours (2 days) or an oral antibiotic medicine for 24 hours (1 day). You should only return to work and activities with other people if your skin shows significant improvement. ? You may return to contact sports after you have used antibiotic medicine for 72 hours (3 days).  Keep all follow-up visits as told by your health care provider. This is important. How is this prevented?  Wash your hands often with soap and warm water.  Do not share towels, washcloths, clothing, bedding, or razors.  Keep your fingernails short.  Keep any cuts, scrapes, bug bites, or rashes clean and covered.  Use insect repellent to prevent bug bites. Contact a health care provider if:  You develop more blisters or sores even with treatment.  Other family members get sores.  Your skin sores are not improving after 72 hours (3 days) of treatment.  You have a fever. Get help right away if:  You see spreading redness or swelling of the skin around your sores.  You see red streaks coming from your sores.  You develop a sore throat.  The area around your rash becomes warm, red, or tender to the touch.  You have dark, reddish-brown urine.  You do not urinate often or you urinate small amounts.  You are very tired (lethargic).  You have swelling in the face, hands, or feet. Summary  Impetigo is a skin infection that causes itchy blisters and sores that produce brownish-yellow fluid. As the fluid dries, it forms a crust.   This condition is caused by staphylococci or streptococci bacteria. These bacteria cause impetigo when they get under the surface of the skin, such as through cuts, rashes, bug bites, or open sores.  Treatment for this condition may include antibiotic ointment or oral antibiotics.  To help prevent impetigo from spreading to other body areas, make sure you keep your fingernails short, avoid scratching, cover any blisters, and wash your hands often.  If you have impetigo, stay home until you have used an antibiotic cream for 48 hours (2 days) or an oral antibiotic medicine for 24 hours (1 day). You should only return to work and activities with other people if your skin shows significant improvement. This information is  not intended to replace advice given to you by your health care provider. Make sure you discuss any questions you have with your health care provider. Document Released: 12/26/2014 Document Revised: 12/27/2016 Document Reviewed: 12/27/2016 Elsevier Interactive Patient Education  2019 [employer].

## 2019-05-10 NOTE — Progress Notes (Signed)
Debra [name] is a 38 y.o. female who presents to [facility] today for poison ivy dermatitis.  Patient was exposed to poison ivy about 3 weeks ago doing some gardening or landscaping.  Started about 2 weeks ago she developed an itchy rash.  This is slowly been spreading.  She tried some over-the-counter poison ivy scrub which helped a little.  She notes a little bit of gold colored crust on the rash on her right forearm starting a few days ago.  She notes this is not painful.  She denies fevers chills nausea vomiting or diarrhea.  She notes that she also has a history of cold sores.  She notes sometimes she gets a gold colored crust on cold sores as well.  She typically uses Valtrex for cold sores as needed.  She is asymptomatic currently but notes that she needs a refill of Valtrex.   ROS as above:  Exam:  BP (!) 138/108   Pulse 98   Ht 5\' 5"  (1.651 m)   Wt 250 lb (113.4 kg)   BMI 41.60 kg/m  Wt Readings from Last 5 Encounters:  05/10/19 250 lb (113.4 kg)  07/17/18 275 lb 3.2 oz (124.8 kg)  07/02/18 281 lb 8 oz (127.7 kg)    Gen: Well NAD HEENT: EOMI,  MMM Lungs: Normal work of breathing. CTABL Heart: RRR no MRG Abd: NABS, Soft. Nondistended, Nontender Exts: Brisk capillary refill, warm and well perfused.  Skin: Linear erythematous rash on bilateral upper extremities.  Some crusted with gold colored crust lesions right forearm present.  Nontender.  Lab and Radiology Results No results found for this or any previous visit (from the past 72 hour(s)). No results found.    Assessment and Plan: 38 y.o. female with  Poison ivy dermatitis.  Patient likely has secondary impetigo on the right forearm.  Plan to treat with triamcinolone cream and mupirocin antibiotic ointment.  Recheck if not improving.  Additionally patient has cold sores.  Reasonable  to have continued prescription for Valtrex existing if needed.  PDMP not reviewed this encounter. No orders of the defined types were placed in this encounter.  Meds ordered this encounter  Medications  . triamcinolone cream (KENALOG) 0.5 %    Sig: Apply 1 application topically 2 (two) times daily. To affected areas.    Dispense:  30 g    Refill:  3  . mupirocin ointment (BACTROBAN) 2 %    Sig: Apply to affected area BID for 7 days.    Dispense:  30 g    Refill:  3  . valACYclovir (VALTREX) 1000 MG tablet    Sig: Take 2 tablets (2,000 mg total) by mouth 2 (two) times daily. For cold sore    Dispense:  4 tablet    Refill:  12     Historical information moved to improve visibility of documentation.  Past Medical History:  Diagnosis Date  . Acne 11/02/2016  . Allergic rhinitis 11/02/2016  . Chronic low back pain without sciatica 01/03/2017  . Cold sore 11/02/2016  . Depressive disorder 11/25/2014  . Dermatographia 11/02/2016  . Esophageal reflux 12/02/2013  . Microscopic hematuria 11/02/2016  . Morbid obesity with BMI of 40.0-44.9, adult ([facility]) 11/02/2016   No past surgical history on file. Social History   Tobacco Use  . Smoking status: Never Smoker  . Smokeless tobacco: Never Used  Substance Use Topics  . Alcohol use: Yes  Comment: 1-5/WK   family history includes Breast cancer in her mother.  Medications: Current Outpatient Medications  Medication Sig Dispense Refill  . carisoprodol (SOMA) 250 MG tablet TK 1 T PO QID PRF 7 DAYS    . Cholecalciferol (VITAMIN D3 PO) Take by mouth.    . cyclobenzaprine (FLEXERIL) 10 MG tablet Take 0.5-1 tablets (5-10 mg total) by mouth 3 (three) times daily as needed for muscle spasms. 60 tablet 1  . Ergocalciferol (VITAMIN D2 PO) Take by mouth.    . fluconazole (DIFLUCAN) 150 MG tablet Take 1 tablet (150 mg total) by mouth once a week. For 2-4 weeks 4 tablet 0  . fluticasone (FLONASE) 50 MCG/ACT nasal spray One spray in each  nostril twice a day, use left hand for right nostril, and right hand for left nostril. 48 g 3  . lansoprazole (PREVACID) 30 MG capsule Take by mouth.    . levonorgestrel (MIRENA, 52 MG,) 20 MCG/24HR IUD by Intrauterine route.    . traZODone (DESYREL) 50 MG tablet Take 0.5-1 tablets (25-50 mg total) by mouth at bedtime as needed for sleep. 30 tablet 3  . mupirocin ointment (BACTROBAN) 2 % Apply to affected area BID for 7 days. 30 g 3  . triamcinolone cream (KENALOG) 0.5 % Apply 1 application topically 2 (two) times daily. To affected areas. 30 g 3  . valACYclovir (VALTREX) 1000 MG tablet Take 2 tablets (2,000 mg total) by mouth 2 (two) times daily. For cold sore 4 tablet 12   No current facility-administered medications for this visit.    No Known Allergies   Discussed warning signs or symptoms. Please see discharge instructions. Patient expresses understanding.

## 2019-05-14 DIAGNOSIS — R87610 Atypical squamous cells of undetermined significance on cytologic smear of cervix (ASC-US): Secondary | ICD-10-CM | POA: Diagnosis not present

## 2019-05-16 DIAGNOSIS — Z719 Counseling, unspecified: Secondary | ICD-10-CM | POA: Diagnosis not present

## 2019-08-01 ENCOUNTER — Telehealth: Payer: Self-pay | Admitting: Osteopathic Medicine

## 2019-08-01 DIAGNOSIS — Z13220 Encounter for screening for lipoid disorders: Secondary | ICD-10-CM

## 2019-08-01 DIAGNOSIS — Z0000 Encounter for general adult medical examination without abnormal findings: Secondary | ICD-10-CM

## 2019-08-01 DIAGNOSIS — Z803 Family history of malignant neoplasm of breast: Secondary | ICD-10-CM

## 2019-08-01 DIAGNOSIS — Z1231 Encounter for screening mammogram for malignant neoplasm of breast: Secondary | ICD-10-CM

## 2019-08-01 DIAGNOSIS — R7401 Elevation of levels of liver transaminase levels: Secondary | ICD-10-CM

## 2019-08-01 DIAGNOSIS — E559 Vitamin D deficiency, unspecified: Secondary | ICD-10-CM

## 2019-08-01 NOTE — Addendum Note (Signed)
Addended by: Towana Badger on: 08/01/2019 03:52 PM   Modules accepted: Orders

## 2019-08-01 NOTE — Telephone Encounter (Signed)
Labs pended.

## 2019-08-01 NOTE — Telephone Encounter (Signed)
Orders signed.  Thank you.

## 2019-08-01 NOTE — Telephone Encounter (Signed)
Patient would like labs called in before physical appointment. Please contact once they are in so patient can come get them done.

## 2019-08-01 NOTE — Addendum Note (Signed)
Addended by: Beatrice Lecher D on: 08/01/2019 04:55 PM   Modules accepted: Orders

## 2019-08-01 NOTE — Telephone Encounter (Signed)
Pt advised.

## 2019-08-02 ENCOUNTER — Other Ambulatory Visit: Payer: Self-pay

## 2019-08-02 DIAGNOSIS — Z13220 Encounter for screening for lipoid disorders: Secondary | ICD-10-CM

## 2019-08-02 DIAGNOSIS — R7401 Elevation of levels of liver transaminase levels: Secondary | ICD-10-CM

## 2019-08-02 DIAGNOSIS — Z0000 Encounter for general adult medical examination without abnormal findings: Secondary | ICD-10-CM

## 2019-08-02 DIAGNOSIS — E559 Vitamin D deficiency, unspecified: Secondary | ICD-10-CM

## 2019-08-02 NOTE — Addendum Note (Signed)
Addended by: Dessie Coma on: 08/02/2019 04:17 PM   Modules accepted: Orders

## 2019-08-02 NOTE — Telephone Encounter (Signed)
Patient has left a message that she would like her screening mammogram. I left a message that it was ordered.

## 2019-08-03 LAB — COMPLETE METABOLIC PANEL WITH GFR
AG Ratio: 1.3 (calc) (ref 1.0–2.5)
ALT: 20 U/L (ref 6–29)
AST: 17 U/L (ref 10–30)
Albumin: 4.3 g/dL (ref 3.6–5.1)
Alkaline phosphatase (APISO): 72 U/L (ref 31–125)
BUN: 20 mg/dL (ref 7–25)
CO2: 25 mmol/L (ref 20–32)
Calcium: 9.8 mg/dL (ref 8.6–10.2)
Chloride: 103 mmol/L (ref 98–110)
Creat: 0.77 mg/dL (ref 0.50–1.10)
GFR, Est African American: 114 mL/min/{1.73_m2} (ref >=60)
GFR, Est Non African American: 99 mL/min/{1.73_m2} (ref >=60)
Globulin: 3.2 g/dL (calc) (ref 1.9–3.7)
Glucose, Bld: 84 mg/dL (ref 65–99)
Potassium: 4.5 mmol/L (ref 3.5–5.3)
Sodium: 137 mmol/L (ref 135–146)
Total Bilirubin: 0.4 mg/dL (ref 0.2–1.2)
Total Protein: 7.5 g/dL (ref 6.1–8.1)

## 2019-08-03 LAB — CBC
HCT: 44.2 % (ref 35.0–45.0)
Hemoglobin: 14.2 g/dL (ref 11.7–15.5)
MCH: 26.4 pg — ABNORMAL LOW (ref 27.0–33.0)
MCHC: 32.1 g/dL (ref 32.0–36.0)
MCV: 82.3 fL (ref 80.0–100.0)
MPV: 12.7 fL — ABNORMAL HIGH (ref 7.5–12.5)
Platelets: 264 10*3/uL (ref 140–400)
RBC: 5.37 10*6/uL — ABNORMAL HIGH (ref 3.80–5.10)
RDW: 12.9 % (ref 11.0–15.0)
WBC: 8.7 10*3/uL (ref 3.8–10.8)

## 2019-08-03 LAB — VITAMIN D 25 HYDROXY (VIT D DEFICIENCY, FRACTURES): Vit D, 25-Hydroxy: 47 ng/mL (ref 30–100)

## 2019-08-03 LAB — LIPID PANEL W/REFLEX DIRECT LDL
Cholesterol: 181 mg/dL (ref <200)
HDL: 58 mg/dL (ref >=50)
LDL Cholesterol (Calc): 103 mg/dL (calc) — ABNORMAL HIGH
Non-HDL Cholesterol (Calc): 123 mg/dL (calc) (ref <130)
Total CHOL/HDL Ratio: 3.1 (calc) (ref <5.0)
Triglycerides: 106 mg/dL (ref <150)

## 2019-08-12 ENCOUNTER — Ambulatory Visit (INDEPENDENT_AMBULATORY_CARE_PROVIDER_SITE_OTHER): Payer: 59 | Admitting: Osteopathic Medicine

## 2019-08-12 ENCOUNTER — Other Ambulatory Visit: Payer: Self-pay

## 2019-08-12 ENCOUNTER — Encounter: Payer: Self-pay | Admitting: Osteopathic Medicine

## 2019-08-12 VITALS — BP 111/78 | HR 54 | Temp 98.1°F | Wt 263.0 lb

## 2019-08-12 DIAGNOSIS — Z0000 Encounter for general adult medical examination without abnormal findings: Secondary | ICD-10-CM

## 2019-08-12 MED ORDER — TRAZODONE HCL 50 MG PO TABS: 25.0000 mg | ORAL_TABLET | Freq: Every evening | ORAL | 3 refills | Status: DC | PRN

## 2019-08-12 MED ORDER — VALACYCLOVIR HCL 1 G PO TABS: 2000.0000 mg | ORAL_TABLET | Freq: Two times a day (BID) | ORAL | 12 refills | Status: DC

## 2019-08-12 NOTE — Patient Instructions (Signed)
General Preventive Care  Most recent routine screening lipids/other labs: already done   Everyone should have blood pressure checked once per year.   Tobacco: don't!   Alcohol: responsible moderation is ok for most adults - if you have concerns about your alcohol intake, please talk to me!   Exercise: as tolerated to reduce risk of cardiovascular disease and diabetes. Strength training will also prevent osteoporosis.   Mental health: if need for mental health care (medicines, counseling, other), or concerns about moods, please let me know!   Sexual health: if need for STD testing, or if concerns with libido/pain problems, please let me know! If you need to discuss your birth control options, please let me know!   Advanced Directive: Living Will and/or Healthcare Power of Attorney recommended for all adults, regardless of age or health.  Vaccines  Flu vaccine: recommended for almost everyone, every fall.   Shingles vaccine: Shingrix recommended after age 2.   Pneumonia vaccines: Prevnar and Pneumovax recommended after age 58, or sooner if certain medical conditions.  Tetanus booster: Tdap recommended every 10 years. Due 2021 Cancer screenings   Colon cancer screening: recommended for everyone at age 67, but some folks need a colonoscopy sooner if risk factors   Breast cancer screening: mammogram per OBGYN instructions.   Cervical cancer screening: Pap every 1 to 5 years depending on age and other risk factors. Can usually stop at age 44 or w/ hysterectomy.   Lung cancer screening: Not needed for non-smokers Infection screenings . HIV: recommended screening at least once age 55-65, more often as needed. . Gonorrhea/Chlamydia: screening as needed . Hepatitis C: recommended for anyone born 10-1963 . TB: certain at-risk populations, or depending on work requirements and/or travel history Other . Bone Density Test: recommended for women at age 22

## 2019-08-12 NOTE — Progress Notes (Signed)
HPI: Debra [name] is a 38 y.o. female who  has a past medical history of Acne (11/02/2016), Allergic rhinitis (11/02/2016), Chronic low back pain without sciatica (01/03/2017), Cold sore (11/02/2016), Depressive disorder (11/25/2014), Dermatographia (11/02/2016), Esophageal reflux (12/02/2013), Microscopic hematuria (11/02/2016), and Morbid obesity with BMI of 40.0-44.9, adult ([place]) (11/02/2016).  she presents to [facility] today, 08/12/19,  for chief complaint of: Annual physical     Patient here for annual physical / wellness exam.  See preventive care reviewed as below.  Recent labs reviewed in detail with the patient.   Additional concerns today include:  None    Past medical, surgical, social and family history reviewed:  Patient Active Problem List   Diagnosis Date Noted  . Insomnia 07/18/2018  . Elevated ALT measurement 07/18/2018  . Snoring 01/03/2017  . Chronic low back pain without sciatica 01/03/2017  . Vitamin D deficiency 11/02/2016  . Morbid obesity with BMI of 40.0-44.9, adult ([place]) 11/02/2016  . Microscopic hematuria 11/02/2016  . Dermatographia 11/02/2016  . Contraceptive, surveillance, intrauterine device 11/02/2016  . Cold sore 11/02/2016  . Allergic rhinitis 11/02/2016  . Acne 11/02/2016  . Depressive disorder 11/25/2014  . Herpes simplex 12/02/2013  . Esophageal reflux 12/02/2013    No past surgical history on file.  Social History   Tobacco Use  . Smoking status: Never Smoker  . Smokeless tobacco: Never Used  Substance Use Topics  . Alcohol use: Yes    Comment: 1-5/WK    Family History  Problem Relation Age of Onset  . Breast cancer Mother      Current medication list and allergy/intolerance information reviewed:    Current Outpatient Medications  Medication Sig Dispense Refill  . carisoprodol (SOMA) 250 MG tablet TK 1 T PO QID PRF 7 DAYS    . Cholecalciferol (VITAMIN D3 PO) Take by mouth.     . cyclobenzaprine (FLEXERIL) 10 MG tablet Take 0.5-1 tablets (5-10 mg total) by mouth 3 (three) times daily as needed for muscle spasms. 60 tablet 1  . Ergocalciferol (VITAMIN D2 PO) Take by mouth.    . fluconazole (DIFLUCAN) 150 MG tablet Take 1 tablet (150 mg total) by mouth once a week. For 2-4 weeks 4 tablet 0  . fluticasone (FLONASE) 50 MCG/ACT nasal spray One spray in each nostril twice a day, use left hand for right nostril, and right hand for left nostril. 48 g 3  . lansoprazole (PREVACID) 30 MG capsule Take by mouth.    . mupirocin ointment (BACTROBAN) 2 % Apply to affected area BID for 7 days. 30 g 3  . traZODone (DESYREL) 50 MG tablet Take 0.5-1 tablets (25-50 mg total) by mouth at bedtime as needed for sleep. 30 tablet 3  . triamcinolone cream (KENALOG) 0.5 % Apply 1 application topically 2 (two) times daily. To affected areas. 30 g 3  . valACYclovir (VALTREX) 1000 MG tablet Take 2 tablets (2,000 mg total) by mouth 2 (two) times daily. For cold sore 4 tablet 12  . levonorgestrel (MIRENA, 52 MG,) 20 MCG/24HR IUD by Intrauterine route.     No current facility-administered medications for this visit.     No Known Allergies    Review of Systems:  Constitutional:  No  fever, no chills, No recent illness, No unintentional weight changes. No significant fatigue.   HEENT: No  headache, no vision change, no hearing change, No sore throat, No  sinus pressure  Cardiac: No  chest pain, No  pressure, No  palpitations, No  Orthopnea  Respiratory:  No  shortness of breath. No  Cough  Gastrointestinal: No  abdominal pain, No  nausea, No  vomiting,  No  blood in stool, No  diarrhea, No  constipation   Musculoskeletal: No new myalgia/arthralgia  Skin: No  Rash, No other wounds/concerning lesions  Genitourinary: No  incontinence, No  abnormal genital bleeding, No abnormal genital discharge  Hem/Onc: No  easy bruising/bleeding, No  abnormal lymph node  Endocrine: No cold intolerance,   No heat intolerance. No polyuria/polydipsia/polyphagia   Neurologic: No  weakness, No  dizziness, No  slurred speech/focal weakness/facial droop  Psychiatric: No  concerns with depression, No  concerns with anxiety, No sleep problems, No mood problems  Exam:  BP 111/78 (BP Location: Left Arm, Patient Position: Sitting, Cuff Size: Large)   Pulse (!) 54   Temp 98.1 F (36.7 C) (Oral)   Wt 263 lb (119.3 kg)   BMI 43.77 kg/m   Constitutional: VS see above. General Appearance: alert, well-developed, well-nourished, NAD  Eyes: Normal lids and conjunctive, non-icteric sclera  Ears, Nose, Mouth, Throat: mask in place   Neck: No masses, trachea midline. No thyroid enlargement. No tenderness/mass appreciated. No lymphadenopathy  Respiratory: Normal respiratory effort. no wheeze, no rhonchi, no rales  Cardiovascular: S1/S2 normal, no murmur, no rub/gallop auscultated. RRR. No lower extremity edema.  Gastrointestinal: Nontender, no masses. No hepatomegaly, no splenomegaly. No hernia appreciated. Bowel sounds normal. Rectal exam deferred.   Musculoskeletal: Gait normal. No clubbing/cyanosis of digits.   Neurological: Normal balance/coordination. No tremor. No cranial nerve deficit on limited exam. Motor and sensation intact and symmetric. Cerebellar reflexes intact.   Skin: warm, dry, intact. No rash/ulcer. No concerning nevi or subq nodules on limited exam.    Psychiatric: Normal judgment/insight. Normal mood and affect. Oriented x3.    No results found for this or any previous visit (from the past 72 hour(s)).  No results found.   ASSESSMENT/PLAN: The encounter diagnosis was Annual physical exam.   No orders of the defined types were placed in this encounter.   Meds ordered this encounter  Medications  . traZODone (DESYREL) 50 MG tablet    Sig: Take 0.5-1 tablets (25-50 mg total) by mouth at bedtime as needed for sleep.    Dispense:  30 tablet    Refill:  3  . valACYclovir  (VALTREX) 1000 MG tablet    Sig: Take 2 tablets (2,000 mg total) by mouth 2 (two) times daily. For cold sore    Dispense:  4 tablet    Refill:  12    Patient Instructions  General Preventive Care  Most recent routine screening lipids/other labs: already done   Everyone should have blood pressure checked once per year.   Tobacco: don't!   Alcohol: responsible moderation is ok for most adults - if you have concerns about your alcohol intake, please talk to me!   Exercise: as tolerated to reduce risk of cardiovascular disease and diabetes. Strength training will also prevent osteoporosis.   Mental health: if need for mental health care (medicines, counseling, other), or concerns about moods, please let me know!   Sexual health: if need for STD testing, or if concerns with libido/pain problems, please let me know! If you need to discuss your birth control options, please let me know!   Advanced Directive: Living Will and/or Healthcare Power of Attorney recommended for all adults, regardless of age or health.  Vaccines  Flu vaccine: recommended for almost everyone, every fall.  Shingles vaccine: Shingrix recommended after age 52.   Pneumonia vaccines: Prevnar and Pneumovax recommended after age 70, or sooner if certain medical conditions.  Tetanus booster: Tdap recommended every 10 years. Due 2021 Cancer screenings   Colon cancer screening: recommended for everyone at age 91, but some folks need a colonoscopy sooner if risk factors   Breast cancer screening: mammogram per OBGYN instructions.   Cervical cancer screening: Pap every 1 to 5 years depending on age and other risk factors. Can usually stop at age 73 or w/ hysterectomy.   Lung cancer screening: Not needed for non-smokers Infection screenings . HIV: recommended screening at least once age 28-65, more often as needed. . Gonorrhea/Chlamydia: screening as needed . Hepatitis C: recommended for anyone born 35-1964 . TB:  certain at-risk populations, or depending on work requirements and/or travel history Other . Bone Density Test: recommended for women at age 92             Visit summary with medication list and pertinent instructions was printed for patient to review. All questions at time of visit were answered - patient instructed to contact office with any additional concerns or updates. ER/RTC precautions were reviewed with the patient.     Please note: voice recognition software was used to produce this document, and typos may escape review. Please contact Dr. Sheppard Coil for any needed clarifications.     Follow-up plan: Return in about 1 year (around 08/11/2020) for [place].

## 2019-08-29 ENCOUNTER — Ambulatory Visit (INDEPENDENT_AMBULATORY_CARE_PROVIDER_SITE_OTHER): Payer: 59

## 2019-08-29 ENCOUNTER — Other Ambulatory Visit: Payer: Self-pay

## 2019-08-29 DIAGNOSIS — Z803 Family history of malignant neoplasm of breast: Secondary | ICD-10-CM

## 2019-08-29 DIAGNOSIS — Z1231 Encounter for screening mammogram for malignant neoplasm of breast: Secondary | ICD-10-CM | POA: Diagnosis not present

## 2019-10-31 ENCOUNTER — Telehealth: Payer: Self-pay

## 2019-10-31 MED ORDER — TRIAMCINOLONE ACETONIDE 0.5 % EX CREA: 1.0000 "application " | TOPICAL_CREAM | Freq: Two times a day (BID) | CUTANEOUS | 3 refills | Status: DC

## 2019-10-31 NOTE — Telephone Encounter (Signed)
Pt left a vm msg stating she has a break out on her hands and it seems to be spreading to other areas. Pt stated she has been using hydrocortisone cream but that it is not working. Was inquiring if provider can send in a rx that is stronger like Kenalog or something similar. Requesting rx be sent to [facility] located in [facility] on [address]. Pls advise, thanks.

## 2019-10-31 NOTE — Telephone Encounter (Signed)
Sent to [place] she had refills of this medication on file at [facility]

## 2019-11-01 NOTE — Telephone Encounter (Signed)
Left a detailed vm msg for pt regarding cream for hands sent to local pharmacy. Direct call back info provided.

## 2019-12-23 ENCOUNTER — Encounter: Payer: Self-pay | Admitting: Osteopathic Medicine

## 2020-05-07 ENCOUNTER — Encounter: Payer: Self-pay | Admitting: Osteopathic Medicine

## 2020-05-25 ENCOUNTER — Telehealth: Payer: Self-pay | Admitting: Osteopathic Medicine

## 2020-05-25 NOTE — Telephone Encounter (Signed)
PT requested bloodwork to be put in. She wants to draw blood on 05/26/20. Nothing special was asked.   Please advise.

## 2020-05-26 ENCOUNTER — Other Ambulatory Visit: Payer: Self-pay | Admitting: Osteopathic Medicine

## 2020-05-26 DIAGNOSIS — E559 Vitamin D deficiency, unspecified: Secondary | ICD-10-CM

## 2020-05-26 DIAGNOSIS — R7401 Elevation of levels of liver transaminase levels: Secondary | ICD-10-CM

## 2020-05-26 DIAGNOSIS — Z0000 Encounter for general adult medical examination without abnormal findings: Secondary | ICD-10-CM

## 2020-05-26 DIAGNOSIS — Z13220 Encounter for screening for lipoid disorders: Secondary | ICD-10-CM

## 2020-05-26 NOTE — Telephone Encounter (Signed)
Task completed. Labs ordered by provider. Requisition handed to patient by Keenan Bachelor T.

## 2020-05-26 NOTE — Progress Notes (Signed)
Orders for annual

## 2020-05-27 LAB — COMPLETE METABOLIC PANEL WITH GFR
AG Ratio: 1.6 (calc) (ref 1.0–2.5)
ALT: 25 U/L (ref 6–29)
AST: 20 U/L (ref 10–30)
Albumin: 4.3 g/dL (ref 3.6–5.1)
Alkaline phosphatase (APISO): 57 U/L (ref 31–125)
BUN: 16 mg/dL (ref 7–25)
CO2: 23 mmol/L (ref 20–32)
Calcium: 9.6 mg/dL (ref 8.6–10.2)
Chloride: 106 mmol/L (ref 98–110)
Creat: 0.77 mg/dL (ref 0.50–1.10)
GFR, Est African American: 114 mL/min/{1.73_m2} (ref >=60)
GFR, Est Non African American: 98 mL/min/{1.73_m2} (ref >=60)
Globulin: 2.7 g/dL (calc) (ref 1.9–3.7)
Glucose, Bld: 97 mg/dL (ref 65–99)
Potassium: 4.2 mmol/L (ref 3.5–5.3)
Sodium: 140 mmol/L (ref 135–146)
Total Bilirubin: 0.3 mg/dL (ref 0.2–1.2)
Total Protein: 7 g/dL (ref 6.1–8.1)

## 2020-05-27 LAB — CBC
HCT: 44.3 % (ref 35.0–45.0)
Hemoglobin: 13.8 g/dL (ref 11.7–15.5)
MCH: 25.7 pg — ABNORMAL LOW (ref 27.0–33.0)
MCHC: 31.2 g/dL — ABNORMAL LOW (ref 32.0–36.0)
MCV: 82.3 fL (ref 80.0–100.0)
MPV: 13.7 fL — ABNORMAL HIGH (ref 7.5–12.5)
Platelets: 202 10*3/uL (ref 140–400)
RBC: 5.38 10*6/uL — ABNORMAL HIGH (ref 3.80–5.10)
RDW: 13.5 % (ref 11.0–15.0)
WBC: 7.7 10*3/uL (ref 3.8–10.8)

## 2020-05-27 LAB — LIPID PANEL
Cholesterol: 139 mg/dL (ref <200)
HDL: 48 mg/dL — ABNORMAL LOW (ref >=50)
LDL Cholesterol (Calc): 71 mg/dL (calc)
Non-HDL Cholesterol (Calc): 91 mg/dL (calc) (ref <130)
Total CHOL/HDL Ratio: 2.9 (calc) (ref <5.0)
Triglycerides: 117 mg/dL (ref <150)

## 2020-05-27 LAB — HEMOGLOBIN A1C
Hgb A1c MFr Bld: 5 % of total Hgb (ref <5.7)
Mean Plasma Glucose: 97 (calc)
eAG (mmol/L): 5.4 (calc)

## 2020-06-01 ENCOUNTER — Ambulatory Visit (INDEPENDENT_AMBULATORY_CARE_PROVIDER_SITE_OTHER): Payer: 59 | Admitting: Osteopathic Medicine

## 2020-06-01 ENCOUNTER — Other Ambulatory Visit: Payer: Self-pay

## 2020-06-01 ENCOUNTER — Encounter: Payer: Self-pay | Admitting: Osteopathic Medicine

## 2020-06-01 VITALS — BP 143/101 | HR 73 | Ht 65.0 in | Wt 270.0 lb

## 2020-06-01 DIAGNOSIS — Z13220 Encounter for screening for lipoid disorders: Secondary | ICD-10-CM | POA: Diagnosis not present

## 2020-06-01 DIAGNOSIS — Z23 Encounter for immunization: Secondary | ICD-10-CM | POA: Diagnosis not present

## 2020-06-01 DIAGNOSIS — Z0000 Encounter for general adult medical examination without abnormal findings: Secondary | ICD-10-CM | POA: Diagnosis not present

## 2020-06-01 DIAGNOSIS — E559 Vitamin D deficiency, unspecified: Secondary | ICD-10-CM

## 2020-06-01 NOTE — Patient Instructions (Addendum)
General Preventive Care  Most recent routine screening labs: already done! See attached.   Everyone should have blood pressure checked once per year. Goal 130/80 or less.   Tobacco: don't!   Alcohol: responsible moderation is ok for most adults - if you have concerns about your alcohol intake, please talk to me!   Exercise: as tolerated to reduce risk of cardiovascular disease and diabetes. Strength training will also prevent osteoporosis.   Mental health: if need for mental health care (medicines, counseling, other), or concerns about moods, please let me know!   Sexual health: if need for STD testing, or if concerns with libido/pain problems, please let me or OBGYN know! If you need to discuss your famkily planning options, please let me or OBGYN know!    Advanced Directive: Living Will and/or Healthcare Power of Attorney recommended for all adults, regardless of age or health.  Vaccines  Flu vaccine: for almost everyone, every fall.    Shingles vaccine: after age 54.   Pneumonia vaccines: after age 20  Tetanus booster: every 10 years / 3rd trimester of pregnancy  COVID vaccine: THANKS for taking your vaccine! :) Cancer screenings   Colon cancer screening: for everyone age 49-75. Colonoscopy available for all, many people also qualify for the Cologuard stool test   Breast cancer screening: mammogram at age 26 every other year at least, and annually after age 5.   Cervical cancer screening: Pap every 1 to 5 years depending on age and other risk factors. Can usually stop at age 66 or w/ hysterectomy.   Lung cancer screening: not needed for OBGYN Infection screenings  . HIV: recommended screening at least once age 88-65, more often as needed. . Gonorrhea/Chlamydia: screening as needed. . Hepatitis C: recommended once for everyone age 97-75 . TB: certain at-risk populations, or depending on work requirements and/or travel history Other . Bone Density Test: recommended for  women at age 79

## 2020-06-01 NOTE — Progress Notes (Signed)
Debra [name] is a 39 y.o. female who presents to  [place] at [facility]  today, 06/01/20, seeking care for the following: . Annual      ASSESSMENT & PLAN with other pertinent history/findings:  The primary encounter diagnosis was Annual physical exam. Diagnoses of Need for Tdap vaccination, Screening for lipid disorders, and Vitamin D deficiency were also pertinent to this visit.  Pt to keep an eye on BP at home, has been ok <130/80 Reviewed labs in detail, no concerns    General Preventive Care  Most recent routine screening labs: already done! See attached.   Everyone should have blood pressure checked once per year. Goal 130/80 or less.   Tobacco: don't!   Alcohol: responsible moderation is ok for most adults - if you have concerns about your alcohol intake, please talk to me!   Exercise: as tolerated to reduce risk of cardiovascular disease and diabetes. Strength training will also prevent osteoporosis.   Mental health: if need for mental health care (medicines, counseling, other), or concerns about moods, please let me know!   Sexual and reproductive health: per OBGYN  Advanced Directive: Living Will and/or [place] recommended for all adults, regardless of age or health.  Vaccines  Flu vaccine: for almost everyone, every fall.   Shingles vaccine: after age 13.   Pneumonia vaccines: after age 46  Tetanus booster: every 10 years / 3rd trimester of pregnancy  COVID vaccine: THANKS for taking your vaccine! :) Cancer screenings   Colon cancer screening: for everyone age 46-75. Colonoscopy available for all, many people also qualify for the Cologuard stool test   Breast cancer screening: mammogram at age 12 every other year at least, and annually after age 75.   Cervical cancer screening: Pap every 1 to 5 years depending on age and other risk factors. Can usually stop at age 36 or w/  hysterectomy.   Lung cancer screening: not needed for OBGYN Infection screenings  . HIV: recommended screening at least once age 101-65, more often as needed. . Gonorrhea/Chlamydia: screening as needed. . Hepatitis C: recommended once for everyone age 70-75 . TB: certain at-risk populations, or depending on work requirements and/or travel history Other . Bone Density Test: recommended for women at age 67       Orders Placed This Encounter  Procedures  . Tdap vaccine greater than or equal to 7yo IM    No orders of the defined types were placed in this encounter.      Follow-up instructions: Return for ANNUAL (call week prior to visit for lab orders).                                         BP (!) 143/101 (BP Location: Left Arm, Patient Position: Sitting, Cuff Size: Large)   Pulse 73   Ht 5\' 5"  (1.651 m)   Wt 270 lb (122.5 kg)   SpO2 96%   BMI 44.93 kg/m   Current Meds  Medication Sig  . carisoprodol (SOMA) 250 MG tablet TK 1 T PO QID PRF 7 DAYS  . Cholecalciferol (VITAMIN D3 PO) Take by mouth.  . cyclobenzaprine (FLEXERIL) 10 MG tablet Take 0.5-1 tablets (5-10 mg total) by mouth 3 (three) times daily as needed for muscle spasms.  . Ergocalciferol (VITAMIN D2 PO) Take by mouth.  . fluconazole (DIFLUCAN) 150  MG tablet Take 1 tablet (150 mg total) by mouth once a week. For 2-4 weeks  . fluticasone (FLONASE) 50 MCG/ACT nasal spray One spray in each nostril twice a day, use left hand for right nostril, and right hand for left nostril.  . lansoprazole (PREVACID) 30 MG capsule Take by mouth.  . mupirocin ointment (BACTROBAN) 2 % Apply to affected area BID for 7 days.  . traZODone (DESYREL) 50 MG tablet Take 0.5-1 tablets (25-50 mg total) by mouth at bedtime as needed for sleep.  Marland Kitchen triamcinolone cream (KENALOG) 0.5 % Apply 1 application topically 2 (two) times daily. To affected areas.  . valACYclovir (VALTREX) 1000 MG tablet Take 2  tablets (2,000 mg total) by mouth 2 (two) times daily. For cold sore  . [DISCONTINUED] levonorgestrel (MIRENA, 52 MG,) 20 MCG/24HR IUD by Intrauterine route.    No results found for this or any previous visit (from the past 72 hour(s)).  No results found.  Depression screen [facility] 2/9 06/01/2020 08/12/2019 07/02/2018  Decreased Interest 1 1 1   Down, Depressed, Hopeless 0 1 1  PHQ - 2 Score 1 2 2   Altered sleeping 1 2 1   Tired, decreased energy 0 1 2  Change in appetite 0 0 0  Feeling bad or failure about yourself  0 1 0  Trouble concentrating 0 1 0  Moving slowly or fidgety/restless 0 0 0  Suicidal thoughts 0 0 0  PHQ-9 Score 2 7 5   Difficult doing work/chores Somewhat difficult Somewhat difficult Not difficult at all    GAD 7 : Generalized Anxiety Score 06/01/2020 08/12/2019 07/02/2018  Nervous, Anxious, on Edge 0 2 1  Control/stop worrying 0 2 0  Worry too much - different things 1 2 0  Trouble relaxing 1 1 0  Restless 0 1 0  Easily annoyed or irritable 2 3 1   Afraid - awful might happen 0 1 0  Total GAD 7 Score 4 12 2   Anxiety Difficulty Somewhat difficult Somewhat difficult Not difficult at all      All questions at time of visit were answered - patient instructed to contact office with any additional concerns or updates.  ER/RTC precautions were reviewed with the patient.  Please note: voice recognition software was used to produce this document, and typos may escape review. Please contact Dr. Sheppard Coil for any needed clarifications.

## 2020-06-05 ENCOUNTER — Ambulatory Visit (INDEPENDENT_AMBULATORY_CARE_PROVIDER_SITE_OTHER): Payer: 59 | Admitting: Osteopathic Medicine

## 2020-06-05 ENCOUNTER — Encounter: Payer: Self-pay | Admitting: Osteopathic Medicine

## 2020-06-05 VITALS — BP 118/87 | HR 65 | Temp 98.3°F | Wt 268.1 lb

## 2020-06-05 DIAGNOSIS — R591 Generalized enlarged lymph nodes: Secondary | ICD-10-CM

## 2020-06-05 DIAGNOSIS — T50Z95A Adverse effect of other vaccines and biological substances, initial encounter: Secondary | ICD-10-CM | POA: Diagnosis not present

## 2020-06-05 MED ORDER — TRAZODONE HCL 50 MG PO TABS: 25.0000 mg | ORAL_TABLET | Freq: Every evening | ORAL | 3 refills | Status: DC | PRN

## 2020-06-05 NOTE — Progress Notes (Signed)
Debra [name] is a 39 y.o. female who presents to  [place] at [facility]  today, 06/05/20, seeking care for the following:  Lymphadenopathy d/t vaccine - on exam, (+)lymphadenopahy and tenderness L lateral supraclavicular area and some edema/induration injection site L deltoid. Normal ROM neck. Pt reports swelling/pain going down some today compared to yesterday.      ASSESSMENT & PLAN with other pertinent history/findings:  The encounter diagnosis was Adverse effect of vaccine, initial encounter.  Advised cold pack and ibuprofen 800 mg qid w/ plenty of hydration  Improving at this point, I think will continue to get better RTC prn may consider OMT lymphatics but she's pretty tender now so I'm not going to do that today. CBC and sed rate reviewed, no concerns    Meds ordered this encounter  Medications   traZODone (DESYREL) 50 MG tablet    Sig: Take 0.5-1 tablets (25-50 mg total) by mouth at bedtime as needed for sleep.    Dispense:  90 tablet    Refill:  3       Follow-up instructions: Return if symptoms worsen or fail to improve.                                         There were no vitals taken for this visit.  No outpatient medications have been marked as taking for the 06/05/20 encounter (Appointment) with Emeterio Reeve, DO.    No results found for this or any previous visit (from the past 72 hour(s)).  No results found.  Depression screen [facility] 2/9 06/01/2020 08/12/2019 07/02/2018  Decreased Interest 1 1 1   Down, Depressed, Hopeless 0 1 1  PHQ - 2 Score 1 2 2   Altered sleeping 1 2 1   Tired, decreased energy 0 1 2  Change in appetite 0 0 0  Feeling bad or failure about yourself  0 1 0  Trouble concentrating 0 1 0  Moving slowly or fidgety/restless 0 0 0  Suicidal thoughts 0 0 0  PHQ-9 Score 2 7 5   Difficult doing work/chores Somewhat difficult Somewhat difficult  Not difficult at all    GAD 7 : Generalized Anxiety Score 06/01/2020 08/12/2019 07/02/2018  Nervous, Anxious, on Edge 0 2 1  Control/stop worrying 0 2 0  Worry too much - different things 1 2 0  Trouble relaxing 1 1 0  Restless 0 1 0  Easily annoyed or irritable 2 3 1   Afraid - awful might happen 0 1 0  Total GAD 7 Score 4 12 2   Anxiety Difficulty Somewhat difficult Somewhat difficult Not difficult at all      All questions at time of visit were answered - patient instructed to contact office with any additional concerns or updates.  ER/RTC precautions were reviewed with the patient.  Please note: voice recognition software was used to produce this document, and typos may escape review. Please contact Dr. Sheppard Coil for any needed clarifications.

## 2020-06-29 ENCOUNTER — Encounter: Payer: Self-pay | Admitting: Osteopathic Medicine

## 2020-07-07 ENCOUNTER — Other Ambulatory Visit: Payer: Self-pay

## 2020-07-07 ENCOUNTER — Encounter: Payer: Self-pay | Admitting: Osteopathic Medicine

## 2020-07-07 DIAGNOSIS — B002 Herpesviral gingivostomatitis and pharyngotonsillitis: Secondary | ICD-10-CM

## 2020-07-07 MED ORDER — VALACYCLOVIR HCL 1 G PO TABS: 2000.0000 mg | ORAL_TABLET | Freq: Two times a day (BID) | ORAL | 12 refills | Status: DC

## 2020-07-07 MED ORDER — VALACYCLOVIR HCL 1 G PO TABS: 2000.0000 mg | ORAL_TABLET | Freq: Two times a day (BID) | ORAL | 1 refills | Status: DC

## 2020-07-13 ENCOUNTER — Encounter: Payer: Self-pay | Admitting: Osteopathic Medicine

## 2020-08-31 ENCOUNTER — Encounter: Payer: Self-pay | Admitting: Osteopathic Medicine

## 2020-08-31 DIAGNOSIS — T7840XD Allergy, unspecified, subsequent encounter: Secondary | ICD-10-CM

## 2020-08-31 NOTE — Telephone Encounter (Signed)
Please advise 

## 2020-09-04 NOTE — Telephone Encounter (Signed)
Pt is requesting a dermatology referral. Pended.

## 2020-09-21 ENCOUNTER — Encounter: Payer: Self-pay | Admitting: Osteopathic Medicine

## 2020-09-21 DIAGNOSIS — Z1231 Encounter for screening mammogram for malignant neoplasm of breast: Secondary | ICD-10-CM

## 2020-09-24 ENCOUNTER — Ambulatory Visit: Payer: 59

## 2020-09-24 ENCOUNTER — Other Ambulatory Visit: Payer: Self-pay

## 2020-09-29 ENCOUNTER — Encounter: Payer: Self-pay | Admitting: Allergy and Immunology

## 2020-09-29 ENCOUNTER — Other Ambulatory Visit: Payer: Self-pay

## 2020-09-29 ENCOUNTER — Ambulatory Visit (INDEPENDENT_AMBULATORY_CARE_PROVIDER_SITE_OTHER): Payer: 59 | Admitting: Allergy and Immunology

## 2020-09-29 VITALS — BP 124/90 | HR 83 | Temp 98.2°F | Resp 16 | Ht 63.98 in | Wt 260.2 lb

## 2020-09-29 DIAGNOSIS — H1013 Acute atopic conjunctivitis, bilateral: Secondary | ICD-10-CM | POA: Diagnosis not present

## 2020-09-29 DIAGNOSIS — J3089 Other allergic rhinitis: Secondary | ICD-10-CM | POA: Diagnosis not present

## 2020-09-29 DIAGNOSIS — J309 Allergic rhinitis, unspecified: Secondary | ICD-10-CM

## 2020-09-29 DIAGNOSIS — H1010 Acute atopic conjunctivitis, unspecified eye: Secondary | ICD-10-CM | POA: Insufficient documentation

## 2020-09-29 MED ORDER — LEVOCETIRIZINE DIHYDROCHLORIDE 5 MG PO TABS: 5.0000 mg | ORAL_TABLET | Freq: Every day | ORAL | 5 refills | Status: DC | PRN

## 2020-09-29 MED ORDER — OLOPATADINE HCL 0.2 % OP SOLN: 1.0000 [drp] | Freq: Every day | OPHTHALMIC | 5 refills | Status: DC | PRN

## 2020-09-29 MED ORDER — AZELASTINE HCL 0.1 % NA SOLN: NASAL | 5 refills | Status: DC

## 2020-09-29 NOTE — Assessment & Plan Note (Signed)
   The patient has requested lab work rather than skin testing today.  Laboratory order form has been provided for zone 2 aeroallergen panel.  The patient will be called when lab results have returned.  A prescription has been provided for levocetirizine (Xyzal), 5 mg daily as needed.  To avoid diminishing benefit with daily use (tachyphylaxis) of second generation antihistamine, consider alternating every few months between fexofenadine (Allegra) and levocetirizine (Xyzal).  A prescription has been provided for azelastine nasal spray, 1-2 sprays per nostril 1-2 times daily as needed. Proper nasal spray technique has been discussed and demonstrated.   Nasal saline spray (i.e., Simply Saline) or nasal saline lavage (i.e., NeilMed) is recommended as needed and prior to medicated nasal sprays.

## 2020-09-29 NOTE — Progress Notes (Signed)
New Patient Note  RE: Debra [name] MRN: [id number] DOB: [birth year] Date of Office Visit: 09/29/2020  Referring provider: Emeterio Reeve, DO Primary care provider: Emeterio Reeve, DO  Chief Complaint: Allergic Rhinitis    History of present illness: Debra [name] is a 39 y.o. female seen today in consultation requested by Emeterio Reeve, DO.  She complains of nasal congestion, rhinorrhea, sneezing, postnasal drainage, nasal pruritus, and ocular pruritus.  The symptoms are triggered with exposure to dust.  Otherwise, no significant seasonal symptom variation has been noted nor have specific environmental triggers been identified.  In the past, she had been on sublingual immunotherapy injections but discontinued for unclear reasons.  She currently attempts to control her nasal and ocular allergy symptoms with loratadine and fluticasone nasal spray without adequate symptom relief.  She has requested blood work rather than skin testing today.  Assessment and plan: Other allergic rhinitis  The patient has requested lab work rather than skin testing today.  Laboratory order form has been provided for zone 2 aeroallergen panel.  The patient will be called when lab results have returned.  A prescription has been provided for levocetirizine (Xyzal), 5 mg daily as needed.  To avoid diminishing benefit with daily use (tachyphylaxis) of second generation antihistamine, consider alternating every few months between fexofenadine (Allegra) and levocetirizine (Xyzal).  A prescription has been provided for azelastine nasal spray, 1-2 sprays per nostril 1-2 times daily as needed. Proper nasal spray technique has been discussed and demonstrated.   Nasal saline spray (i.e., Simply Saline) or nasal saline lavage (i.e., NeilMed) is recommended as needed and prior to medicated nasal sprays.  Allergic conjunctivitis  Treatment plan as outlined above for allergic  rhinitis.  A prescription has been provided for generic Pataday, one drop per eye daily as needed.  If insurance does not cover this medication, medicated allergy eyedrops may be purchased over-the-counter as Restaurant manager, fast food.  I have also recommended eye lubricant drops (i.e., Natural Tears) as needed.   Meds ordered this encounter  Medications  . levocetirizine (XYZAL) 5 MG tablet    Sig: Take 1 tablet (5 mg total) by mouth daily as needed for allergies.    Dispense:  30 tablet    Refill:  5  . azelastine (ASTELIN) 0.1 % nasal spray    Sig: 1-2 sprays per nostril 1-2 times daily as needed    Dispense:  30 mL    Refill:  5  . Olopatadine HCl (PATADAY) 0.2 % SOLN    Sig: Apply 1 drop to eye daily as needed.    Dispense:  2.5 mL    Refill:  5    Diagnostics: Allergy skin testing: Refused.  Physical examination: Blood pressure 124/90, pulse 83, temperature 98.2 F (36.8 C), temperature source Temporal, resp. rate 16, height 5' 3.98" (1.625 m), weight 260 lb 3.2 oz (118 kg), SpO2 97 %.  General: Alert, interactive, in no acute distress. HEENT: TMs pearly gray, turbinates moderately edematous without discharge, post-pharynx moderately erythematous. Neck: Supple without lymphadenopathy. Lungs: Clear to auscultation without wheezing, rhonchi or rales. CV: Normal S1, S2 without murmurs. Abdomen: Nondistended, nontender. Skin: Warm and dry, without lesions or rashes. Extremities:  No clubbing, cyanosis or edema. Neuro:   Grossly intact.  Review of systems:  Review of systems negative except as noted in HPI / PMHx or noted below: Review of Systems  Constitutional: Negative.   HENT: Negative.   Eyes: Negative.   Respiratory: Negative.  Cardiovascular: Negative.   Gastrointestinal: Negative.   Genitourinary: Negative.   Musculoskeletal: Negative.   Skin: Negative.   Neurological: Negative.   Endo/Heme/Allergies: Negative.   Psychiatric/Behavioral:  Negative.     Past medical history:  Past Medical History:  Diagnosis Date  . Acne 11/02/2016  . Allergic rhinitis 11/02/2016  . Chronic low back pain without sciatica 01/03/2017  . Cold sore 11/02/2016  . Depressive disorder 11/25/2014  . Dermatographia 11/02/2016  . Esophageal reflux 12/02/2013  . Microscopic hematuria 11/02/2016  . Morbid obesity with BMI of 40.0-44.9, adult ([place]) 11/02/2016    Past surgical history:  History reviewed. No pertinent surgical history.  Family history: Family History  Problem Relation Age of Onset  . Breast cancer Mother   . Allergic rhinitis Mother   . Eczema Mother   . Allergic rhinitis Father   . Allergic rhinitis Brother     Social history: Social History   Socioeconomic History  . Marital status: Married    Spouse name: Not on file  . Number of children: Not on file  . Years of education: Not on file  . Highest education level: Not on file  Occupational History  . Not on file  Tobacco Use  . Smoking status: Never Smoker  . Smokeless tobacco: Never Used  Vaping Use  . Vaping Use: Never used  Substance and Sexual Activity  . Alcohol use: Yes    Comment: 1-5/WK  . Drug use: Never  . Sexual activity: Yes    Partners: Male    Birth control/protection: I.U.D.  Other Topics Concern  . Not on file  Social History Narrative  . Not on file   Social Determinants of Health   Financial Resource Strain:   . Difficulty of Paying Living Expenses: Not on file  Food Insecurity:   . Worried About Charity fundraiser in the Last Year: Not on file  . Ran Out of Food in the Last Year: Not on file  Transportation Needs:   . Lack of Transportation (Medical): Not on file  . Lack of Transportation (Non-Medical): Not on file  Physical Activity:   . Days of Exercise per Week: Not on file  . Minutes of Exercise per Session: Not on file  Stress:   . Feeling of Stress : Not on file  Social Connections:   . Frequency of Communication with  Friends and Family: Not on file  . Frequency of Social Gatherings with Friends and Family: Not on file  . Attends Religious Services: Not on file  . Active Member of Clubs or Organizations: Not on file  . Attends Archivist Meetings: Not on file  . Marital Status: Not on file  Intimate Partner Violence:   . Fear of Current or Ex-Partner: Not on file  . Emotionally Abused: Not on file  . Physically Abused: Not on file  . Sexually Abused: Not on file    Environmental History: The patient lives in a 38 year old house with carpeting in the bedroom and central air/heat.  There are 2 cats in the home which have access to her bedroom.  There is no known mold/water damage in the home.  She is a non-smoker.  Current Outpatient Medications  Medication Sig Dispense Refill  . Cholecalciferol (VITAMIN D3 PO) Take by mouth daily.     . cyclobenzaprine (FLEXERIL) 10 MG tablet Take 0.5-1 tablets (5-10 mg total) by mouth 3 (three) times daily as needed for muscle spasms. 60 tablet 1  . Ergocalciferol (  VITAMIN D2 PO) Take by mouth daily.     . fluticasone (FLONASE) 50 MCG/ACT nasal spray One spray in each nostril twice a day, use left hand for right nostril, and right hand for left nostril. (Patient taking differently: Place 2 sprays into both nostrils daily. One spray in each nostril twice a day, use left hand for right nostril, and right hand for left nostril.) 48 g 3  . lansoprazole (PREVACID) 30 MG capsule Take 30 mg by mouth daily.     . traZODone (DESYREL) 50 MG tablet Take 0.5-1 tablets (25-50 mg total) by mouth at bedtime as needed for sleep. 90 tablet 3  . triamcinolone cream (KENALOG) 0.5 % Apply 1 application topically 2 (two) times daily. To affected areas. (Patient taking differently: Apply 1 application topically as needed. To affected areas.) 30 g 3  . valACYclovir (VALTREX) 1000 MG tablet Take 2 tablets (2,000 mg total) by mouth 2 (two) times daily. For cold sore (Patient taking  differently: Take 2,000 mg by mouth as needed. For cold sore) 56 tablet 1  . azelastine (ASTELIN) 0.1 % nasal spray 1-2 sprays per nostril 1-2 times daily as needed 30 mL 5  . levocetirizine (XYZAL) 5 MG tablet Take 1 tablet (5 mg total) by mouth daily as needed for allergies. 30 tablet 5  . mupirocin ointment (BACTROBAN) 2 % Apply to affected area BID for 7 days. (Patient not taking: Reported on 09/29/2020) 30 g 3  . Olopatadine HCl (PATADAY) 0.2 % SOLN Apply 1 drop to eye daily as needed. 2.5 mL 5   No current facility-administered medications for this visit.    Known medication allergies: No Known Allergies  I appreciate the opportunity to take part in Debra [name]'s care. Please do not hesitate to contact me with questions.  Sincerely,   R. Edgar Frisk, MD

## 2020-09-29 NOTE — Patient Instructions (Addendum)
Other allergic rhinitis  The patient has requested lab work rather than skin testing today.  Laboratory order form has been provided for zone 2 aeroallergen panel.  The patient will be called when lab results have returned.  A prescription has been provided for levocetirizine (Xyzal), 5 mg daily as needed.  To avoid diminishing benefit with daily use (tachyphylaxis) of second generation antihistamine, consider alternating every few months between fexofenadine (Allegra) and levocetirizine (Xyzal).  A prescription has been provided for azelastine nasal spray, 1-2 sprays per nostril 1-2 times daily as needed. Proper nasal spray technique has been discussed and demonstrated.   Nasal saline spray (i.e., Simply Saline) or nasal saline lavage (i.e., NeilMed) is recommended as needed and prior to medicated nasal sprays.  Allergic conjunctivitis  Treatment plan as outlined above for allergic rhinitis.  A prescription has been provided for generic Pataday, one drop per eye daily as needed.  If insurance does not cover this medication, medicated allergy eyedrops may be purchased over-the-counter as Restaurant manager, fast food.  I have also recommended eye lubricant drops (i.e., Natural Tears) as needed.   Return in about 3 months (around 12/30/2020), or if symptoms worsen or fail to improve.  Control of House Dust Mite Allergen  House dust mites play a major role in allergic asthma and rhinitis.  They occur in environments with high humidity wherever human skin, the food for dust mites is found. High levels have been detected in dust obtained from mattresses, pillows, carpets, upholstered furniture, bed covers, clothes and soft toys.  The principal allergen of the house dust mite is found in its feces.  A gram of dust may contain 1,000 mites and 250,000 fecal particles.  Mite antigen is easily measured in the air during house cleaning activities.    1. Encase mattresses, including the box  spring, and pillow, in an air tight cover.  Seal the zipper end of the encased mattresses with wide adhesive tape. 2. Wash the bedding in water of 130 degrees Farenheit weekly.  Avoid cotton comforters/quilts and flannel bedding: the most ideal bed covering is the dacron comforter. 3. Remove all upholstered furniture from the bedroom. 4. Remove carpets, carpet padding, rugs, and non-washable window drapes from the bedroom.  Wash drapes weekly or use plastic window coverings. 5. Remove all non-washable stuffed toys from the bedroom.  Wash stuffed toys weekly. 6. Have the room cleaned frequently with a vacuum cleaner and a damp dust-mop.  The patient should not be in a room which is being cleaned and should wait 1 hour after cleaning before going into the room. 7. Close and seal all heating outlets in the bedroom.  Otherwise, the room will become filled with dust-laden air.  An electric heater can be used to heat the room. 8. Reduce indoor humidity to less than 50%.  Do not use a humidifier.

## 2020-09-29 NOTE — Assessment & Plan Note (Signed)
   Treatment plan as outlined above for allergic rhinitis.  A prescription has been provided for generic Pataday, one drop per eye daily as needed.  If insurance does not cover this medication, medicated allergy eyedrops may be purchased over-the-counter as Pataday Extra Strength or Zaditor.  I have also recommended eye lubricant drops (i.e., Natural Tears) as needed. 

## 2020-10-01 ENCOUNTER — Ambulatory Visit (INDEPENDENT_AMBULATORY_CARE_PROVIDER_SITE_OTHER): Payer: 59

## 2020-10-01 ENCOUNTER — Other Ambulatory Visit: Payer: Self-pay

## 2020-10-01 DIAGNOSIS — Z1231 Encounter for screening mammogram for malignant neoplasm of breast: Secondary | ICD-10-CM | POA: Diagnosis not present

## 2020-10-02 ENCOUNTER — Telehealth: Payer: Self-pay

## 2020-10-02 ENCOUNTER — Encounter: Payer: Self-pay | Admitting: Osteopathic Medicine

## 2020-10-02 DIAGNOSIS — B002 Herpesviral gingivostomatitis and pharyngotonsillitis: Secondary | ICD-10-CM

## 2020-10-02 DIAGNOSIS — J3089 Other allergic rhinitis: Secondary | ICD-10-CM

## 2020-10-02 LAB — IGE+ALLERGENS ZONE 2(30)
Alternaria Alternata IgE: <0.1 kU/L
Amer Sycamore IgE Qn: <0.1 kU/L
Aspergillus Fumigatus IgE: <0.1 kU/L
Bahia Grass IgE: <0.1 kU/L
Bermuda Grass IgE: <0.1 kU/L
Cat Dander IgE: <0.1 kU/L
Cedar, Mountain IgE: <0.1 kU/L
Cladosporium Herbarum IgE: <0.1 kU/L
Cockroach, American IgE: <0.1 kU/L
Common Silver Birch IgE: <0.1 kU/L
D Farinae IgE: 2.74 kU/L — AB
D Pteronyssinus IgE: 2.3 kU/L — AB
Dog Dander IgE: <0.1 kU/L
Elm, American IgE: <0.1 kU/L
Hickory, White IgE: <0.1 kU/L
IgE (Immunoglobulin E), Serum: 42 IU/mL (ref 6–495)
Johnson Grass IgE: <0.1 kU/L
Maple/Box Elder IgE: <0.1 kU/L
Mucor Racemosus IgE: <0.1 kU/L
Mugwort IgE Qn: <0.1 kU/L
Nettle IgE: <0.1 kU/L
Oak, White IgE: <0.1 kU/L
Penicillium Chrysogen IgE: <0.1 kU/L
Pigweed, Rough IgE: <0.1 kU/L
Plantain, English IgE: <0.1 kU/L
Ragweed, Short IgE: <0.1 kU/L
Sheep Sorrel IgE Qn: <0.1 kU/L
Stemphylium Herbarum IgE: <0.1 kU/L
Sweet gum IgE RAST Ql: <0.1 kU/L
Timothy Grass IgE: <0.1 kU/L
White Mulberry IgE: <0.1 kU/L

## 2020-10-02 MED ORDER — VALACYCLOVIR HCL 1 G PO TABS: 2000.0000 mg | ORAL_TABLET | Freq: Two times a day (BID) | ORAL | 0 refills | Status: DC

## 2020-10-02 MED ORDER — TRAZODONE HCL 50 MG PO TABS: 25.0000 mg | ORAL_TABLET | Freq: Every evening | ORAL | 3 refills | Status: DC | PRN

## 2020-10-02 MED ORDER — CYCLOBENZAPRINE HCL 10 MG PO TABS: 5.0000 mg | ORAL_TABLET | Freq: Three times a day (TID) | ORAL | 0 refills | Status: DC | PRN

## 2020-10-02 NOTE — Telephone Encounter (Signed)
[name], Debra Martinique  [name], Ralph Carter, MD 6 hours ago (9:18 AM)  LW Good Morning,   I just saw the allergy results and I'm happy to know I'm no longer allergic to cat dander! The dust mite numbers don't surprise me that much.  I have a question - is there any way we could go ahead and get a food panel run as well? I used to have slight allergies to shrimp & eggs and wanted to see if those should still be a concern for me now. As well see if there are any other common foods I eat that I should be avoiding.  Thank you, [name]

## 2020-10-02 NOTE — Telephone Encounter (Signed)
Previously sent to local pharmacy, sounds like she needs 90 day supply to [facility]. Pended. Sign if appropriate.

## 2020-10-05 NOTE — Telephone Encounter (Signed)
Please order labs for specific IgE against food allergen panel. Thanks.

## 2020-10-05 NOTE — Addendum Note (Signed)
Addended by: Felipa Emory on: 10/05/2020 03:59 PM   Modules accepted: Orders

## 2020-10-05 NOTE — Telephone Encounter (Signed)
Orders placed.

## 2020-10-06 MED ORDER — VALACYCLOVIR HCL 1 G PO TABS: 1000.0000 mg | ORAL_TABLET | Freq: Every day | ORAL | 0 refills | Status: DC

## 2020-10-06 NOTE — Addendum Note (Signed)
Addended by: Maryla Morrow on: 10/06/2020 09:37 AM   Modules accepted: Orders

## 2020-10-13 LAB — ALLERGEN PROFILE, BASIC FOOD
Allergen Corn, IgE: <0.1 kU/L
Beef IgE: <0.1 kU/L
Chocolate/Cacao IgE: <0.1 kU/L
Egg, Whole IgE: <0.1 kU/L
Food Mix (Seafoods) IgE: NEGATIVE
Milk IgE: <0.1 kU/L
Peanut IgE: <0.1 kU/L
Pork IgE: <0.1 kU/L
Soybean IgE: <0.1 kU/L
Wheat IgE: <0.1 kU/L

## 2020-10-13 LAB — SPECIMEN STATUS REPORT

## 2020-11-28 ENCOUNTER — Other Ambulatory Visit: Payer: Self-pay | Admitting: Osteopathic Medicine

## 2020-11-28 DIAGNOSIS — B002 Herpesviral gingivostomatitis and pharyngotonsillitis: Secondary | ICD-10-CM

## 2020-12-15 NOTE — Progress Notes (Addendum)
[place] [id number] Dept: [phone]  FOLLOW UP NOTE  Patient ID: Debra [name], female    DOB: [birth year]  Age: 39 y.o. MRN: [id number] Date of Office Visit: 12/16/2020  Assessment  Chief Complaint: Allergic Rhinitis  (Still has the itching and the Levocetirizine made her have vivid dreams, azelastine burned. )  HPI Eliyana Martinique [name] is a 39 year old female who presents to the clinic for follow-up visit. She was last seen in this clinic on 09/29/2020 by Dr. Verlin Fester for evaluation of allergic rhinitis and allergic conjunctivitis. At today's visit she reports allergic rhinitis has been moderately well controlled with symptoms including clear rhinorrhea about 2 days a week, daily morning nasal congestion, and postnasal drainage occurring mostly in the morning. She reports that she has stopped taking Xyzal as it gave her very vivid dreams and she has stopped using azelastine as this medication induced a burning feeling in her nostrils. She does continue Claritin 10 mg once a day or Benadryl 50 mg once a day as well as Flonase as needed. She reports that she has used a saline nasal rinse before, however, she has not used this recently. She reports excellent technique with Flonase application. She reports dust mite free covers are in use on her mattress and pillows. Allergic conjunctivitis is reported as moderately well controlled with occasional use of Pataday. Her current medications are listed in the chart. She is currently going through the invitro fertilization process at this time.    Drug Allergies:  No Known Allergies  Physical Exam: BP 118/84 (BP Location: Right Arm, Patient Position: Sitting, Cuff Size: Large)   Pulse 81   Resp 20   SpO2 97%    Physical Exam Vitals reviewed.  Constitutional:      Appearance: Normal appearance.  HENT:     Head: Normocephalic and atraumatic.     Right Ear: Tympanic membrane normal.     Left Ear: Tympanic  membrane normal.     Nose:     Comments: Bilateral nares slightly erythematous with clear nasal drainage noted. Pharynx normal. Ears normal. Eyes normal.    Mouth/Throat:     Pharynx: Oropharynx is clear.  Eyes:     Conjunctiva/sclera: Conjunctivae normal.  Cardiovascular:     Rate and Rhythm: Normal rate and regular rhythm.     Heart sounds: Normal heart sounds. No murmur heard.   Pulmonary:     Effort: Pulmonary effort is normal.     Breath sounds: Normal breath sounds.     Comments: Lungs clear to auscultation Musculoskeletal:        General: Normal range of motion.     Cervical back: Normal range of motion and neck supple.  Skin:    General: Skin is warm and dry.  Neurological:     Mental Status: She is alert and oriented to person, place, and time.  Psychiatric:        Mood and Affect: Mood normal.        Behavior: Behavior normal.        Thought Content: Thought content normal.        Judgment: Judgment normal.    Assessment and Plan: 1. Perennial allergic rhinitis   2. Allergic conjunctivitis of both eyes     Patient Instructions  Allergic rhinitis Stop Flonase and begin Rhinocort nasal spray 1 spray in each nostril once a day as needed for stuffy nose. If no improvement you may increase to 2 sprays in each nostril  once a day as needed for stuffy nose.  In the right nostril, point the applicator out toward the right ear. In the left nostril, point the applicator out toward the left ear Consider saline nasal rinses as needed for nasal symptoms. Use this before any medicated nasal sprays for best result Continue Claritin 10 mg once a day as needed for runny nose or itch. Continue allergen avoidance measures directed toward dust mite as listed below  Allergic conjunctivitis Some over the counter eye drops include Pataday one drop in each eye once a day as needed for red, itchy eyes OR Zaditor one drop in each eye twice a day as needed for red itchy eyes.  Clear all  your medications with your OB/GYN while continuing with IVF treatments, during pregnancy and during lactation.   Call the clinic if this treatment plan is not working well for you  Follow up in 6 months or sooner if needed.   Return in about 6 months (around 06/16/2021), or if symptoms worsen or fail to improve.    Thank you for the opportunity to care for this patient.  Please do not hesitate to contact me with questions.  Thermon Leyland, FNP [facility]  ________________________________________________  I have provided oversight concerning [facility]'s evaluation and treatment of this patient's health issues addressed during today's encounter.  I agree with the assessment and therapeutic plan as outlined in the note.   Signed,   R Jorene Guest, MD

## 2020-12-15 NOTE — Patient Instructions (Addendum)
Allergic rhinitis Stop Flonase and begin Rhinocort nasal spray 1 spray in each nostril once a day as needed for stuffy nose. If no improvement you may increase to 2 sprays in each nostril once a day as needed for stuffy nose.  In the right nostril, point the applicator out toward the right ear. In the left nostril, point the applicator out toward the left ear Consider saline nasal rinses as needed for nasal symptoms. Use this before any medicated nasal sprays for best result Continue Claritin 10 mg once a day as needed for runny nose or itch. Continue allergen avoidance measures directed toward dust mite as listed below  Allergic conjunctivitis Some over the counter eye drops include Pataday one drop in each eye once a day as needed for red, itchy eyes OR Zaditor one drop in each eye twice a day as needed for red itchy eyes.  Clear all your medications with your OB/GYN while continuing with IVF treatments, during pregnancy and during lactation.   Call the clinic if this treatment plan is not working well for you  Follow up in 6 months or sooner if needed.  Control of Dust Mite Allergen Dust mites play a major role in allergic asthma and rhinitis. They occur in environments with high humidity wherever human skin is found. Dust mites absorb humidity from the atmosphere (ie, they do not drink) and feed on organic matter (including shed human and animal skin). Dust mites are a microscopic type of insect that you cannot see with the naked eye. High levels of dust mites have been detected from mattresses, pillows, carpets, upholstered furniture, bed covers, clothes, soft toys and any woven material. The principal allergen of the dust mite is found in its feces. A gram of dust may contain 1,000 mites and 250,000 fecal particles. Mite antigen is easily measured in the air during house cleaning activities. Dust mites do not bite and do not cause harm to humans, other than by triggering  allergies/asthma.  Ways to decrease your exposure to dust mites in your home:  1. Encase mattresses, box springs and pillows with a mite-impermeable barrier or cover  2. Wash sheets, blankets and drapes weekly in hot water (130 F) with detergent and dry them in a dryer on the hot setting.  3. Have the room cleaned frequently with a vacuum cleaner and a damp dust-mop. For carpeting or rugs, vacuuming with a vacuum cleaner equipped with a high-efficiency particulate air (HEPA) filter. The dust mite allergic individual should not be in a room which is being cleaned and should wait 1 hour after cleaning before going into the room.  4. Do not sleep on upholstered furniture (eg, couches).  5. If possible removing carpeting, upholstered furniture and drapery from the home is ideal. Horizontal blinds should be eliminated in the rooms where the person spends the most time (bedroom, study, television room). Washable vinyl, roller-type shades are optimal.  6. Remove all non-washable stuffed toys from the bedroom. Wash stuffed toys weekly like sheets and blankets above.  7. Reduce indoor humidity to less than 50%. Inexpensive humidity monitors can be purchased at most hardware stores. Do not use a humidifier as can make the problem worse and are not recommended.

## 2020-12-16 ENCOUNTER — Other Ambulatory Visit: Payer: Self-pay

## 2020-12-16 ENCOUNTER — Telehealth: Payer: Self-pay | Admitting: Family Medicine

## 2020-12-16 ENCOUNTER — Encounter: Payer: Self-pay | Admitting: Family Medicine

## 2020-12-16 ENCOUNTER — Ambulatory Visit (INDEPENDENT_AMBULATORY_CARE_PROVIDER_SITE_OTHER): Payer: 59 | Admitting: Family Medicine

## 2020-12-16 VITALS — BP 118/84 | HR 81 | Resp 20

## 2020-12-16 DIAGNOSIS — J3089 Other allergic rhinitis: Secondary | ICD-10-CM | POA: Diagnosis not present

## 2020-12-16 DIAGNOSIS — H1013 Acute atopic conjunctivitis, bilateral: Secondary | ICD-10-CM

## 2020-12-16 MED ORDER — BUDESONIDE 32 MCG/ACT NA SUSP: 1.0000 | Freq: Every day | NASAL | 5 refills | Status: DC | PRN

## 2020-12-16 NOTE — Addendum Note (Signed)
Addended by: Hetty Blend on: 12/16/2020 07:57 PM   Modules accepted: Orders

## 2020-12-17 ENCOUNTER — Telehealth: Payer: Self-pay | Admitting: *Deleted

## 2020-12-17 NOTE — Telephone Encounter (Signed)
Pt informed

## 2020-12-17 NOTE — Telephone Encounter (Signed)
-----   Message from Hetty Blend, FNP sent at 12/16/2020  7:55 PM EST ----- Can you please call this patient and let her know that I have ordered Rhinocort nasal spray from her pharmacy. It looks as though it might not be covered by her insurance. Please let her know that she might get a better price buying this over the counter rather than from the pharmacy if it's not covered by her insurance. Thank you

## 2021-06-02 ENCOUNTER — Encounter: Payer: Self-pay | Admitting: Osteopathic Medicine

## 2021-06-02 DIAGNOSIS — R7309 Other abnormal glucose: Secondary | ICD-10-CM

## 2021-06-02 DIAGNOSIS — E559 Vitamin D deficiency, unspecified: Secondary | ICD-10-CM

## 2021-06-02 DIAGNOSIS — Z0000 Encounter for general adult medical examination without abnormal findings: Secondary | ICD-10-CM

## 2021-06-04 LAB — COMPLETE METABOLIC PANEL WITH GFR
AG Ratio: 1.4 (calc) (ref 1.0–2.5)
ALT: 22 U/L (ref 6–29)
AST: 17 U/L (ref 10–30)
Albumin: 4.5 g/dL (ref 3.6–5.1)
Alkaline phosphatase (APISO): 75 U/L (ref 31–125)
BUN: 15 mg/dL (ref 7–25)
CO2: 23 mmol/L (ref 20–32)
Calcium: 9.7 mg/dL (ref 8.6–10.2)
Chloride: 105 mmol/L (ref 98–110)
Creat: 0.76 mg/dL (ref 0.50–1.10)
GFR, Est African American: 115 mL/min/{1.73_m2} (ref >=60)
GFR, Est Non African American: 99 mL/min/{1.73_m2} (ref >=60)
Globulin: 3.3 g/dL (calc) (ref 1.9–3.7)
Glucose, Bld: 79 mg/dL (ref 65–99)
Potassium: 4.4 mmol/L (ref 3.5–5.3)
Sodium: 137 mmol/L (ref 135–146)
Total Bilirubin: 0.3 mg/dL (ref 0.2–1.2)
Total Protein: 7.8 g/dL (ref 6.1–8.1)

## 2021-06-04 LAB — CBC WITH DIFFERENTIAL/PLATELET
Absolute Monocytes: 446 cells/uL (ref 200–950)
Basophils Absolute: 41 cells/uL (ref 0–200)
Basophils Relative: 0.5 %
Eosinophils Absolute: 194 cells/uL (ref 15–500)
Eosinophils Relative: 2.4 %
HCT: 47 % — ABNORMAL HIGH (ref 35.0–45.0)
Hemoglobin: 14.8 g/dL (ref 11.7–15.5)
Lymphs Abs: 3929 cells/uL — ABNORMAL HIGH (ref 850–3900)
MCH: 25.8 pg — ABNORMAL LOW (ref 27.0–33.0)
MCHC: 31.5 g/dL — ABNORMAL LOW (ref 32.0–36.0)
MCV: 81.9 fL (ref 80.0–100.0)
MPV: 13 fL — ABNORMAL HIGH (ref 7.5–12.5)
Monocytes Relative: 5.5 %
Neutro Abs: 3491 cells/uL (ref 1500–7800)
Neutrophils Relative %: 43.1 %
Platelets: 176 10*3/uL (ref 140–400)
RBC: 5.74 10*6/uL — ABNORMAL HIGH (ref 3.80–5.10)
RDW: 12.4 % (ref 11.0–15.0)
Total Lymphocyte: 48.5 %
WBC: 8.1 10*3/uL (ref 3.8–10.8)

## 2021-06-04 LAB — LIPID PANEL W/REFLEX DIRECT LDL
Cholesterol: 200 mg/dL — ABNORMAL HIGH (ref <200)
HDL: 61 mg/dL (ref >=50)
LDL Cholesterol (Calc): 118 mg/dL (calc) — ABNORMAL HIGH
Non-HDL Cholesterol (Calc): 139 mg/dL (calc) — ABNORMAL HIGH (ref <130)
Total CHOL/HDL Ratio: 3.3 (calc) (ref <5.0)
Triglycerides: 103 mg/dL (ref <150)

## 2021-06-04 LAB — VITAMIN D 25 HYDROXY (VIT D DEFICIENCY, FRACTURES): Vit D, 25-Hydroxy: 34 ng/mL (ref 30–100)

## 2021-06-04 LAB — HEMOGLOBIN A1C
Hgb A1c MFr Bld: 5.2 % of total Hgb (ref <5.7)
Mean Plasma Glucose: 103 mg/dL
eAG (mmol/L): 5.7 mmol/L

## 2021-06-07 ENCOUNTER — Ambulatory Visit (INDEPENDENT_AMBULATORY_CARE_PROVIDER_SITE_OTHER): Payer: 59 | Admitting: Osteopathic Medicine

## 2021-06-07 VITALS — BP 132/71 | HR 62 | Temp 98.2°F | Wt 272.1 lb

## 2021-06-07 DIAGNOSIS — Z0000 Encounter for general adult medical examination without abnormal findings: Secondary | ICD-10-CM | POA: Diagnosis not present

## 2021-06-07 NOTE — Patient Instructions (Signed)
Since computer update, patient instructions are not coming through or printing properly on the After Visit Summary. Please see attached for patient instructions from today's visit!   

## 2021-06-07 NOTE — Progress Notes (Signed)
Debra [name] is a 40 y.o. female who presents to  [place] at [facility]  today, 06/07/21, seeking care for the following:  Annual physical  Mosquito bites - significant itching, she applied non-medicated barrier dressing, still having issues w/ itching and redness Concerned about plantar fasciitis over the past few months Following w/ REI, planing to utilize egg donor  Labs reviewed      [name] with other pertinent findings:  The encounter diagnosis was Annual physical exam.   Preventive care reviewed Pap not due per ASCCP guidelines, last one normal Advised COVID booster     No orders of the defined types were placed in this encounter.   No orders of the defined types were placed in this encounter.        See below for relevant physical exam findings  See below for recent lab and imaging results reviewed  Medications, allergies, PMH, PSH, SocH, Stuart reviewed below    Follow-up instructions: Return in about 1 year (around 06/07/2022) for [name] (call week prior to visit for lab orders).                                        Exam:  BP 132/71 (BP Location: Left Arm, Patient Position: Sitting, Cuff Size: Large)   Pulse 62   Temp 98.2 F (36.8 C) (Oral)   Wt 272 lb 1.3 oz (123.4 kg)   BMI 46.74 kg/m  Constitutional: VS see above. General Appearance: alert, well-developed, well-nourished, NAD Neck: No masses, trachea midline.  Respiratory: Normal respiratory effort. no wheeze, no rhonchi, no rales Cardiovascular: S1/S2 normal, no murmur, no rub/gallop auscultated. RRR.  Musculoskeletal: Gait normal. Symmetric and independent movement of all extremities Abdominal: non-tender, non-distended, no appreciable organomegaly, neg Murphy's, BS WNLx4 Neurological: Normal balance/coordination. No tremor. Skin: warm, dry, intact.  Psychiatric: Normal judgment/insight.  Normal mood and affect. Oriented x3.   Current Meds  Medication Sig   budesonide (RHINOCORT AQUA) 32 MCG/ACT nasal spray Place 1 spray into both nostrils daily as needed.   Cholecalciferol (VITAMIN D3 PO) Take by mouth daily.    cyclobenzaprine (FLEXERIL) 10 MG tablet Take 0.5-1 tablets (5-10 mg total) by mouth 3 (three) times daily as needed for muscle spasms.   Ergocalciferol (VITAMIN D2 PO) Take by mouth daily.    fluticasone (FLONASE) 50 MCG/ACT nasal spray One spray in each nostril twice a day, use left hand for right nostril, and right hand for left nostril. (Patient taking differently: Place 2 sprays into both nostrils daily. One spray in each nostril twice a day, use left hand for right nostril, and right hand for left nostril.)   lansoprazole (PREVACID) 30 MG capsule Take 30 mg by mouth daily.    Olopatadine HCl (PATADAY) 0.2 % SOLN Apply 1 drop to eye daily as needed.   tamoxifen (NOLVADEX) 20 MG tablet Take by mouth.   traZODone (DESYREL) 50 MG tablet Take 0.5-1 tablets (25-50 mg total) by mouth at bedtime as needed for sleep.   triamcinolone cream (KENALOG) 0.5 % Apply 1 application topically 2 (two) times daily. To affected areas. (Patient taking differently: Apply 1 application topically as needed. To affected areas.)   valACYclovir (VALTREX) 1000 MG tablet TAKE 1 TABLET BY MOUTH  DAILY    No Known Allergies  Patient Active Problem List   Diagnosis Date Noted   Allergic  conjunctivitis of both eyes 12/16/2020   Allergic conjunctivitis 09/29/2020   Insomnia 07/18/2018   Elevated ALT measurement 07/18/2018   Snoring 01/03/2017   Chronic low back pain without sciatica 01/03/2017   Vitamin D deficiency 11/02/2016   Morbid obesity with BMI of 40.0-44.9, adult ([address]) 11/02/2016   Microscopic hematuria 11/02/2016   Dermatographia 11/02/2016   Contraceptive, surveillance, intrauterine device 11/02/2016   Cold sore 11/02/2016   Perennial allergic rhinitis 11/02/2016   Acne  11/02/2016   Depressive disorder 11/25/2014   Herpes simplex 12/02/2013   Esophageal reflux 12/02/2013    Family History  Problem Relation Age of Onset   Breast cancer Mother    Allergic rhinitis Mother    Eczema Mother    Allergic rhinitis Father    Allergic rhinitis Brother     Social History   Tobacco Use  Smoking Status Never  Smokeless Tobacco Never    No past surgical history on file.  Immunization History  Administered Date(s) Administered   Influenza Inj Mdck Quad Pf 09/29/2018   Influenza,inj,Quad PF,6+ Mos 08/29/2019, 08/27/2020   Influenza-Unspecified 09/22/2014, 10/19/2016, 08/22/2018   MMR 08/27/2018   Moderna Sars-Covid-2 Vaccination 03/06/2020, 04/03/2020   Tdap 05/13/2010, 06/01/2020    Recent Results (from the past 2160 hour(s))  Lipid Panel w/reflex Direct LDL     Status: Abnormal   Collection Time: 06/03/21 10:25 AM  Result Value Ref Range   Cholesterol 200 (H) <200 mg/dL   HDL 61 > OR = 50 mg/dL   Triglycerides 103 <150 mg/dL   LDL Cholesterol (Calc) 118 (H) mg/dL (calc)    Comment: Reference range: <100 . Desirable range <100 mg/dL for primary prevention;   <70 mg/dL for patients with CHD or diabetic patients  with > or = 2 CHD risk factors. Marland Kitchen LDL-C is now calculated using the Martin-Hopkins  calculation, which is a validated novel method providing  better accuracy than the Friedewald equation in the  estimation of LDL-C.  Cresenciano Genre et al. Annamaria Helling. 4098;119(14): 2061-2068  (http://education.[facility].com/faq/FAQ164)    Total CHOL/HDL Ratio 3.3 <5.0 (calc)   Non-HDL Cholesterol (Calc) 139 (H) <130 mg/dL (calc)    Comment: For patients with diabetes plus 1 major ASCVD risk  factor, treating to a non-HDL-C goal of <100 mg/dL  (LDL-C of <70 mg/dL) is considered a therapeutic  option.   CBC w/Diff/Platelet     Status: Abnormal   Collection Time: 06/03/21 10:25 AM  Result Value Ref Range   WBC 8.1 3.8 - 10.8 Thousand/uL   RBC 5.74  (H) 3.80 - 5.10 Million/uL   Hemoglobin 14.8 11.7 - 15.5 g/dL   HCT 47.0 (H) 35.0 - 45.0 %   MCV 81.9 80.0 - 100.0 fL   MCH 25.8 (L) 27.0 - 33.0 pg   MCHC 31.5 (L) 32.0 - 36.0 g/dL   RDW 12.4 11.0 - 15.0 %   Platelets 176 140 - 400 Thousand/uL   MPV 13.0 (H) 7.5 - 12.5 fL   Neutro Abs 3,491 1,500 - 7,800 cells/uL   Lymphs Abs 3,929 (H) 850 - 3,900 cells/uL   Absolute Monocytes 446 200 - 950 cells/uL   Eosinophils Absolute 194 15 - 500 cells/uL   Basophils Absolute 41 0 - 200 cells/uL   Neutrophils Relative % 43.1 %   Total Lymphocyte 48.5 %   Monocytes Relative 5.5 %   Eosinophils Relative 2.4 %   Basophils Relative 0.5 %  COMPLETE METABOLIC PANEL WITH GFR     Status: None   Collection  Time: 06/03/21 10:25 AM  Result Value Ref Range   Glucose, Bld 79 65 - 99 mg/dL    Comment: .            Fasting reference interval .    BUN 15 7 - 25 mg/dL   Creat 0.76 0.50 - 1.10 mg/dL   GFR, Est Non African American 99 > OR = 60 mL/min/1.78m   GFR, Est African American 115 > OR = 60 mL/min/1.757m  BUN/Creatinine Ratio NOT APPLICABLE 6 - 22 (calc)   Sodium 137 135 - 146 mmol/L   Potassium 4.4 3.5 - 5.3 mmol/L   Chloride 105 98 - 110 mmol/L   CO2 23 20 - 32 mmol/L   Calcium 9.7 8.6 - 10.2 mg/dL   Total Protein 7.8 6.1 - 8.1 g/dL   Albumin 4.5 3.6 - 5.1 g/dL   Globulin 3.3 1.9 - 3.7 g/dL (calc)   AG Ratio 1.4 1.0 - 2.5 (calc)   Total Bilirubin 0.3 0.2 - 1.2 mg/dL   Alkaline phosphatase (APISO) 75 31 - 125 U/L   AST 17 10 - 30 U/L   ALT 22 6 - 29 U/L  HgB A1c     Status: None   Collection Time: 06/03/21 10:25 AM  Result Value Ref Range   Hgb A1c MFr Bld 5.2 <5.7 % of total Hgb    Comment: For the purpose of screening for the presence of diabetes: . <5.7%       Consistent with the absence of diabetes 5.7-6.4%    Consistent with increased risk for diabetes             (prediabetes) > or =6.5%  Consistent with diabetes . This assay result is consistent with a decreased risk of  diabetes. . Currently, no consensus exists regarding use of hemoglobin A1c for diagnosis of diabetes in children. . According to American Diabetes Association (ADA) guidelines, hemoglobin A1c <7.0% represents optimal control in non-pregnant diabetic patients. Different metrics may apply to specific patient populations.  Standards of Medical Care in Diabetes(ADA). .    Mean Plasma Glucose 103 mg/dL   eAG (mmol/L) 5.7 mmol/L  Vitamin D (25 hydroxy)     Status: None   Collection Time: 06/03/21 10:27 AM  Result Value Ref Range   Vit D, 25-Hydroxy 34 30 - 100 ng/mL    Comment: Vitamin D Status         25-OH Vitamin D: . Deficiency:                    <20 ng/mL Insufficiency:             20 - 29 ng/mL Optimal:                 > or = 30 ng/mL . For 25-OH Vitamin D testing on patients on  D2-supplementation and patients for whom quantitation  of D2 and D3 fractions is required, the QuestAssureD(TM) 25-OH VIT D, (D2,D3), LC/MS/MS is recommended: order  code [id number] >2y5yr See Note 1 . Note 1 . For additional information, please refer to  http://education.[facility].com/faq/FAQ199  (This link is being provided for informational/ educational purposes only.)     No results found.     All questions at time of visit were answered - patient instructed to contact office with any additional concerns or updates. ER/RTC precautions were reviewed with the patient as applicable.   Please note: manual typing as well as voice recognition software may have  been used to produce this document - typos may escape review. Please contact Dr. Sheppard Coil for any needed clarifications.

## 2021-06-10 MED ORDER — TRIAMCINOLONE ACETONIDE 0.5 % EX CREA: 1.0000 "application " | TOPICAL_CREAM | Freq: Two times a day (BID) | CUTANEOUS | 3 refills | Status: DC

## 2021-08-11 ENCOUNTER — Encounter: Payer: Self-pay | Admitting: Osteopathic Medicine

## 2021-08-11 DIAGNOSIS — Z1231 Encounter for screening mammogram for malignant neoplasm of breast: Secondary | ICD-10-CM

## 2021-08-11 NOTE — Telephone Encounter (Signed)
Questions about testing require an office visit - virtual is ok OK to send order for mammo or pt can call downstairs

## 2021-08-24 ENCOUNTER — Encounter: Payer: Self-pay | Admitting: Osteopathic Medicine

## 2021-08-30 ENCOUNTER — Other Ambulatory Visit: Payer: Self-pay

## 2021-08-30 ENCOUNTER — Encounter: Payer: Self-pay | Admitting: Osteopathic Medicine

## 2021-08-30 ENCOUNTER — Ambulatory Visit (INDEPENDENT_AMBULATORY_CARE_PROVIDER_SITE_OTHER): Payer: 59 | Admitting: Osteopathic Medicine

## 2021-08-30 VITALS — BP 148/84 | HR 75 | Temp 98.3°F | Ht 65.0 in | Wt 276.0 lb

## 2021-08-30 DIAGNOSIS — X509XXA Other and unspecified overexertion or strenuous movements or postures, initial encounter: Secondary | ICD-10-CM

## 2021-08-30 DIAGNOSIS — D229 Melanocytic nevi, unspecified: Secondary | ICD-10-CM | POA: Diagnosis not present

## 2021-08-30 DIAGNOSIS — L578 Other skin changes due to chronic exposure to nonionizing radiation: Secondary | ICD-10-CM

## 2021-08-30 DIAGNOSIS — R9431 Abnormal electrocardiogram [ECG] [EKG]: Secondary | ICD-10-CM

## 2021-08-30 DIAGNOSIS — Z0184 Encounter for antibody response examination: Secondary | ICD-10-CM

## 2021-08-30 NOTE — Progress Notes (Signed)
Debra [name] is a 40 y.o. female who presents to  [place] at [facility]  today, 08/30/21, seeking care for the following:  Stye on L eye: going down w/ warm compresses, on exam I do not see any concerns on the lids Skin check requested: Patient is name down.  She has benign nevi on back/chest, she has very mild hyperpigmentation reminiscent of acanthosis nigricans but not severe, she has few minor hemangioma, she has some very mild darkening of the skin under the breast tissue that I think is just due to friction, no yeast Concern for spot on R eye lower lid for milia or whitehead, appears benign to me would agree with small milia Ear pain: on L, pressure better but was pretty bad past couple of days, on exam right and left tympanic membranes and canals are normal, clear effusion behind TM bilaterally  High HR w/ exercise, feeling heart pound with exertion but relieved with rest.  Noticing this during strenuous gym exercise such as elliptical/treadmill.  No lightheadedness/presyncope.  Would like EKG today since she had one a few years ago and has not been followed up.  EKG today is normal Requests lab testing to confirm polio immunity.  Has a couple questions around immunity status testing and genetic testing, she and her husband have been going through some fertility testing and pursuit of egg donor implant/IVF.      ASSESSMENT & PLAN with other pertinent findings:  The primary encounter diagnosis was Immunity status testing. Diagnoses of Solar aging of skin, Benign nevus of skin, and Adverse effect of exertion, initial encounter were also pertinent to this visit.   No concerns on skin check, nothing to make me worried about malignancy. EKG looks normal, I am not concerned about what sounds like normal exercise response, if she starts developing more significant shortness of breath or chest pain on exertion would strongly consider  cardiac work-up. Patient unable to confirm vaccine history but she states that her parents would have given her all recommended pediatric shots so she is very likely appropriately immune to polio, she asks for antibodies testing will get this done and consider booster depending on antibody levels though this is not something a test for routinely so we may be boosting unnecessarily if titers are low, we will look into this  Patient Instructions    Orders Placed This Encounter  Procedures   Poliovirus (Types 1,3) Ab    No orders of the defined types were placed in this encounter.    See below for relevant physical exam findings  See below for recent lab and imaging results reviewed  Medications, allergies, PMH, PSH, SocH, [place] reviewed below    Follow-up instructions: Return for ANNUALDUE 05/2022.                                        Exam:  BP (!) 148/84 (BP Location: Left Arm, Patient Position: Sitting, Cuff Size: Large)   Pulse 75   Temp 98.3 F (36.8 C)   Ht 5' 5" (1.651 m)   Wt 276 lb (125.2 kg)   SpO2 98%   BMI 45.93 kg/m  Constitutional: VS see above. General Appearance: alert, well-developed, well-nourished, NAD Neck: No masses, trachea midline.  Respiratory: Normal respiratory effort. no wheeze, no rhonchi, no rales Cardiovascular: S1/S2 normal, no murmur, no rub/gallop auscultated. RRR.  Musculoskeletal:  Gait normal. Symmetric and independent movement of all extremities Abdominal: non-tender, non-distended, no appreciable organomegaly, neg Murphy's, BS WNLx4 Neurological: Normal balance/coordination. No tremor. Skin: warm, dry, intact.  Psychiatric: Normal judgment/insight. Normal mood and affect. Oriented x3.   Current Meds  Medication Sig   budesonide (RHINOCORT AQUA) 32 MCG/ACT nasal spray Place 1 spray into both nostrils daily as needed.   Cholecalciferol (VITAMIN D3 PO) Take by mouth daily.    cyclobenzaprine  (FLEXERIL) 10 MG tablet Take 0.5-1 tablets (5-10 mg total) by mouth 3 (three) times daily as needed for muscle spasms.   Ergocalciferol (VITAMIN D2 PO) Take by mouth daily.    fluticasone (FLONASE) 50 MCG/ACT nasal spray One spray in each nostril twice a day, use left hand for right nostril, and right hand for left nostril. (Patient taking differently: Place 2 sprays into both nostrils daily. One spray in each nostril twice a day, use left hand for right nostril, and right hand for left nostril.)   lansoprazole (PREVACID) 30 MG capsule Take 30 mg by mouth daily.    Olopatadine HCl (PATADAY) 0.2 % SOLN Apply 1 drop to eye daily as needed.   traZODone (DESYREL) 50 MG tablet Take 0.5-1 tablets (25-50 mg total) by mouth at bedtime as needed for sleep.   triamcinolone cream (KENALOG) 0.5 % Apply 1 application topically 2 (two) times daily. To affected areas.   valACYclovir (VALTREX) 1000 MG tablet TAKE 1 TABLET BY MOUTH  DAILY    No Known Allergies  Patient Active Problem List   Diagnosis Date Noted   Allergic conjunctivitis of both eyes 12/16/2020   Allergic conjunctivitis 09/29/2020   Insomnia 07/18/2018   Elevated ALT measurement 07/18/2018   Snoring 01/03/2017   Chronic low back pain without sciatica 01/03/2017   Vitamin D deficiency 11/02/2016   Morbid obesity with BMI of 40.0-44.9, adult ([place]) 11/02/2016   Microscopic hematuria 11/02/2016   Dermatographia 11/02/2016   Cold sore 11/02/2016   Perennial allergic rhinitis 11/02/2016   Acne 11/02/2016   Depressive disorder 11/25/2014   Herpes simplex 12/02/2013   Esophageal reflux 12/02/2013    Family History  Problem Relation Age of Onset   Breast cancer Mother    Allergic rhinitis Mother    Eczema Mother    Allergic rhinitis Father    Allergic rhinitis Brother     Social History   Tobacco Use  Smoking Status Never  Smokeless Tobacco Never    History reviewed. No pertinent surgical history.  Immunization History   Administered Date(s) Administered   Influenza Inj Mdck Quad Pf 09/29/2018, 08/16/2021   Influenza,inj,Quad PF,6+ Mos 08/29/2019, 08/27/2020   Influenza-Unspecified 09/22/2014, 10/19/2016, 08/22/2018   MMR 08/27/2018   Moderna Sars-Covid-2 Vaccination 03/06/2020, 04/03/2020, 12/01/2020   Tdap 05/13/2010, 06/01/2020    Recent Results (from the past 2160 hour(s))  Lipid Panel w/reflex Direct LDL     Status: Abnormal   Collection Time: 06/03/21 10:25 AM  Result Value Ref Range   Cholesterol 200 (H) <200 mg/dL   HDL 61 > OR = 50 mg/dL   Triglycerides 103 <150 mg/dL   LDL Cholesterol (Calc) 118 (H) mg/dL (calc)    Comment: Reference range: <100 . Desirable range <100 mg/dL for primary prevention;   <70 mg/dL for patients with CHD or diabetic patients  with > or = 2 CHD risk factors. Marland Kitchen LDL-C is now calculated using the Martin-Hopkins  calculation, which is a validated novel method providing  better accuracy than the Friedewald equation in the  estimation  of LDL-C.  Cresenciano Genre et al. Annamaria Helling. 1696;789(38): 2061-2068  (http://education.[facility].com/faq/FAQ164)    Total CHOL/HDL Ratio 3.3 <5.0 (calc)   Non-HDL Cholesterol (Calc) 139 (H) <130 mg/dL (calc)    Comment: For patients with diabetes plus 1 major ASCVD risk  factor, treating to a non-HDL-C goal of <100 mg/dL  (LDL-C of <70 mg/dL) is considered a therapeutic  option.   CBC w/Diff/Platelet     Status: Abnormal   Collection Time: 06/03/21 10:25 AM  Result Value Ref Range   WBC 8.1 3.8 - 10.8 Thousand/uL   RBC 5.74 (H) 3.80 - 5.10 Million/uL   Hemoglobin 14.8 11.7 - 15.5 g/dL   HCT 47.0 (H) 35.0 - 45.0 %   MCV 81.9 80.0 - 100.0 fL   MCH 25.8 (L) 27.0 - 33.0 pg   MCHC 31.5 (L) 32.0 - 36.0 g/dL   RDW 12.4 11.0 - 15.0 %   Platelets 176 140 - 400 Thousand/uL   MPV 13.0 (H) 7.5 - 12.5 fL   Neutro Abs 3,491 1,500 - 7,800 cells/uL   Lymphs Abs 3,929 (H) 850 - 3,900 cells/uL   Absolute Monocytes 446 200 - 950 cells/uL    Eosinophils Absolute 194 15 - 500 cells/uL   Basophils Absolute 41 0 - 200 cells/uL   Neutrophils Relative % 43.1 %   Total Lymphocyte 48.5 %   Monocytes Relative 5.5 %   Eosinophils Relative 2.4 %   Basophils Relative 0.5 %  COMPLETE METABOLIC PANEL WITH GFR     Status: None   Collection Time: 06/03/21 10:25 AM  Result Value Ref Range   Glucose, Bld 79 65 - 99 mg/dL    Comment: .            Fasting reference interval .    BUN 15 7 - 25 mg/dL   Creat 0.76 0.50 - 1.10 mg/dL   GFR, Est Non African American 99 > OR = 60 mL/min/1.59m   GFR, Est African American 115 > OR = 60 mL/min/1.765m  BUN/Creatinine Ratio NOT APPLICABLE 6 - 22 (calc)   Sodium 137 135 - 146 mmol/L   Potassium 4.4 3.5 - 5.3 mmol/L   Chloride 105 98 - 110 mmol/L   CO2 23 20 - 32 mmol/L   Calcium 9.7 8.6 - 10.2 mg/dL   Total Protein 7.8 6.1 - 8.1 g/dL   Albumin 4.5 3.6 - 5.1 g/dL   Globulin 3.3 1.9 - 3.7 g/dL (calc)   AG Ratio 1.4 1.0 - 2.5 (calc)   Total Bilirubin 0.3 0.2 - 1.2 mg/dL   Alkaline phosphatase (APISO) 75 31 - 125 U/L   AST 17 10 - 30 U/L   ALT 22 6 - 29 U/L  HgB A1c     Status: None   Collection Time: 06/03/21 10:25 AM  Result Value Ref Range   Hgb A1c MFr Bld 5.2 <5.7 % of total Hgb    Comment: For the purpose of screening for the presence of diabetes: . <5.7%       Consistent with the absence of diabetes 5.7-6.4%    Consistent with increased risk for diabetes             (prediabetes) > or =6.5%  Consistent with diabetes . This assay result is consistent with a decreased risk of diabetes. . Currently, no consensus exists regarding use of hemoglobin A1c for diagnosis of diabetes in children. . According to American Diabetes Association (ADA) guidelines, hemoglobin A1c <7.0% represents optimal control in non-pregnant  diabetic patients. Different metrics may apply to specific patient populations.  Standards of Medical Care in Diabetes(ADA). .    Mean Plasma Glucose 103 mg/dL    eAG (mmol/L) 5.7 mmol/L  Vitamin D (25 hydroxy)     Status: None   Collection Time: 06/03/21 10:27 AM  Result Value Ref Range   Vit D, 25-Hydroxy 34 30 - 100 ng/mL    Comment: Vitamin D Status         25-OH Vitamin D: . Deficiency:                    <20 ng/mL Insufficiency:             20 - 29 ng/mL Optimal:                 > or = 30 ng/mL . For 25-OH Vitamin D testing on patients on  D2-supplementation and patients for whom quantitation  of D2 and D3 fractions is required, the QuestAssureD(TM) 25-OH VIT D, (D2,D3), LC/MS/MS is recommended: order  code [id number] (patients >8yr). See Note 1 . Note 1 . For additional information, please refer to  http://education.[facility].com/faq/FAQ199  (This link is being provided for informational/ educational purposes only.)     No results found.     All questions at time of visit were answered - patient instructed to contact office with any additional concerns or updates. ER/RTC precautions were reviewed with the patient as applicable.   Please note: manual typing as well as voice recognition software may have been used to produce this document - typos may escape review. Please contact Dr. ASheppard Coilfor any needed clarifications.

## 2021-09-01 NOTE — Addendum Note (Signed)
Addended by: Narda Rutherford on: 09/01/2021 03:49 PM   Modules accepted: Orders

## 2021-09-08 ENCOUNTER — Telehealth: Payer: Self-pay

## 2021-09-08 NOTE — Telephone Encounter (Signed)
Debra [name] wanted to get small pox. I advised her to go to the local health department.

## 2021-09-22 ENCOUNTER — Encounter: Payer: Self-pay | Admitting: Osteopathic Medicine

## 2021-09-22 LAB — POLIOVIRUS (TYPES 1,3) AB
Polio Virus Antibody Type 1: 1:40 {titer}
Polio Virus Antibody Type 3: <1:10 {titer}

## 2021-09-22 NOTE — Progress Notes (Signed)
Has immunity to type 1 but not type 3. She needs a booster of polio vaccine.

## 2021-10-06 ENCOUNTER — Ambulatory Visit (INDEPENDENT_AMBULATORY_CARE_PROVIDER_SITE_OTHER): Payer: 59

## 2021-10-06 ENCOUNTER — Other Ambulatory Visit: Payer: Self-pay

## 2021-10-06 DIAGNOSIS — Z1231 Encounter for screening mammogram for malignant neoplasm of breast: Secondary | ICD-10-CM | POA: Diagnosis not present

## 2021-10-11 MED ORDER — POLIOVIRUS VACCINE INACTIVATED IJ INJ: 0.5000 mL | INJECTION | Freq: Once | INTRAMUSCULAR | 0 refills | Status: AC

## 2021-10-11 NOTE — Telephone Encounter (Signed)
Please send RX.  Charyl Bigger, CMA

## 2021-10-11 NOTE — Telephone Encounter (Signed)
Ok to send order for polio vaccine or come to the office to get it.

## 2021-10-11 NOTE — Telephone Encounter (Signed)
Please review pended order for polio vaccine to verify this is the correct immunization that the pt needs and sign order to go to pharmacy if appropriate.  Charyl Bigger, CMA

## 2021-10-14 ENCOUNTER — Other Ambulatory Visit: Payer: Self-pay

## 2021-10-14 DIAGNOSIS — Z23 Encounter for immunization: Secondary | ICD-10-CM

## 2021-10-14 MED ORDER — POLIOVIRUS VACCINE INACTIVATED IJ INJ: 0.5000 mL | INJECTION | Freq: Once | INTRAMUSCULAR | 0 refills | Status: AC

## 2021-10-28 NOTE — Progress Notes (Signed)
Please call patient. Normal mammogram.  Repeat in 1 year.  

## 2021-12-19 NOTE — L&D Delivery Note (Signed)
Delivery Note ?At 6:44 AM a non-viable female was delivered via Vaginal, Breech  APGAR: 0, ; weight  pending.   ? ?Cytotec 462mg buccal and 400 mcg rectal placed and pitocin started. ?Fentanyl 100 mcg and Toradol 30 mg IV for pain relief.  ? ?Placenta status:  retained. 30 min. OR informed at 7.10 am to post case ASAP, RN shortage, getting ready. Plan suction D&C, manual removal of placenta under anesthesia ? ?Anesthesia: None ? ?Est. Blood Loss (mL): 600, with retained placenta.  ? ?Mom to OR.  Baby to [facility] ? ?VElveria Royals?04/01/2022, 7:44 AM ? ? ? ?

## 2022-01-21 LAB — OB RESULTS CONSOLE HIV ANTIBODY (ROUTINE TESTING): HIV: NONREACTIVE

## 2022-01-21 LAB — OB RESULTS CONSOLE HEPATITIS B SURFACE ANTIGEN: Hepatitis B Surface Ag: NEGATIVE

## 2022-01-21 LAB — OB RESULTS CONSOLE GC/CHLAMYDIA
Chlamydia: NEGATIVE
Gonorrhea: NEGATIVE

## 2022-01-21 LAB — OB RESULTS CONSOLE VARICELLA ZOSTER ANTIBODY, IGG: Varicella: IMMUNE

## 2022-01-21 LAB — OB RESULTS CONSOLE RUBELLA ANTIBODY, IGM: Rubella: IMMUNE

## 2022-01-27 NOTE — Telephone Encounter (Signed)
error 

## 2022-03-31 ENCOUNTER — Other Ambulatory Visit: Payer: Self-pay

## 2022-03-31 ENCOUNTER — Encounter (HOSPITAL_COMMUNITY): Payer: Self-pay | Admitting: Obstetrics & Gynecology

## 2022-03-31 ENCOUNTER — Inpatient Hospital Stay (HOSPITAL_COMMUNITY)
Admission: AD | Admit: 2022-03-31 | Discharge: 2022-04-02 | DRG: 796 | Disposition: A | Discharge status: Home / Self Care | Payer: 59 | Attending: Obstetrics & Gynecology | Admitting: Obstetrics & Gynecology

## 2022-03-31 DIAGNOSIS — O321XX0 Maternal care for breech presentation, not applicable or unspecified: Secondary | ICD-10-CM | POA: Diagnosis present

## 2022-03-31 DIAGNOSIS — O411220 Chorioamnionitis, second trimester, not applicable or unspecified: Secondary | ICD-10-CM | POA: Diagnosis present

## 2022-03-31 DIAGNOSIS — O9852 Other viral diseases complicating childbirth: Secondary | ICD-10-CM | POA: Diagnosis present

## 2022-03-31 DIAGNOSIS — O9081 Anemia of the puerperium: Secondary | ICD-10-CM | POA: Diagnosis not present

## 2022-03-31 DIAGNOSIS — O99214 Obesity complicating childbirth: Secondary | ICD-10-CM | POA: Diagnosis present

## 2022-03-31 DIAGNOSIS — Z3A20 20 weeks gestation of pregnancy: Secondary | ICD-10-CM

## 2022-03-31 DIAGNOSIS — O42112 Preterm premature rupture of membranes, onset of labor more than 24 hours following rupture, second trimester: Principal | ICD-10-CM | POA: Diagnosis present

## 2022-03-31 DIAGNOSIS — D62 Acute posthemorrhagic anemia: Secondary | ICD-10-CM | POA: Diagnosis not present

## 2022-03-31 DIAGNOSIS — O99892 Other specified diseases and conditions complicating childbirth: Secondary | ICD-10-CM | POA: Diagnosis present

## 2022-03-31 DIAGNOSIS — O42912 Preterm premature rupture of membranes, unspecified as to length of time between rupture and onset of labor, second trimester: Secondary | ICD-10-CM | POA: Diagnosis present

## 2022-03-31 DIAGNOSIS — O3432 Maternal care for cervical incompetence, second trimester: Secondary | ICD-10-CM | POA: Diagnosis present

## 2022-03-31 DIAGNOSIS — R000 Tachycardia, unspecified: Secondary | ICD-10-CM | POA: Diagnosis present

## 2022-03-31 DIAGNOSIS — R109 Unspecified abdominal pain: Secondary | ICD-10-CM | POA: Diagnosis not present

## 2022-03-31 DIAGNOSIS — O09812 Supervision of pregnancy resulting from assisted reproductive technology, second trimester: Secondary | ICD-10-CM

## 2022-03-31 DIAGNOSIS — O99891 Other specified diseases and conditions complicating pregnancy: Secondary | ICD-10-CM | POA: Diagnosis not present

## 2022-03-31 DIAGNOSIS — B001 Herpesviral vesicular dermatitis: Secondary | ICD-10-CM | POA: Diagnosis present

## 2022-03-31 HISTORY — DX: Supervision of pregnancy resulting from assisted reproductive technology, second trimester: O09.812

## 2022-03-31 HISTORY — DX: Preterm premature rupture of membranes, unspecified as to length of time between rupture and onset of labor, second trimester: O42.912

## 2022-03-31 LAB — URINALYSIS, ROUTINE W REFLEX MICROSCOPIC
Bilirubin Urine: NEGATIVE
Glucose, UA: NEGATIVE mg/dL
Hgb urine dipstick: NEGATIVE
Ketones, ur: 20 mg/dL — AB
Nitrite: NEGATIVE
Protein, ur: NEGATIVE mg/dL
Specific Gravity, Urine: 1.003 — ABNORMAL LOW (ref 1.005–1.030)
pH: 6 (ref 5.0–8.0)

## 2022-03-31 LAB — AMNISURE RUPTURE OF MEMBRANE (ROM) NOT AT ARMC: Amnisure ROM: POSITIVE

## 2022-03-31 MED ORDER — OXYTOCIN BOLUS FROM INFUSION: 333.0000 mL | Freq: Once | INTRAVENOUS | Status: DC

## 2022-03-31 MED ORDER — LIDOCAINE HCL (PF) 1 % IJ SOLN: 30.0000 mL | INTRAMUSCULAR | Status: DC | PRN

## 2022-03-31 MED ORDER — PANTOPRAZOLE SODIUM 20 MG PO TBEC
20.0000 mg | DELAYED_RELEASE_TABLET | Freq: Every day | ORAL | Status: DC
Start: 2022-04-01 — End: 2022-04-01
  Filled 2022-03-31: qty 1

## 2022-03-31 MED ORDER — TRAZODONE 25 MG HALF TABLET: 25.0000 mg | ORAL_TABLET | Freq: Every evening | ORAL | Status: DC | PRN

## 2022-03-31 MED ORDER — OXYCODONE-ACETAMINOPHEN 5-325 MG PO TABS: 2.0000 | ORAL_TABLET | ORAL | Status: DC | PRN

## 2022-03-31 MED ORDER — LACTATED RINGERS IV BOLUS
1000.0000 mL | Freq: Once | INTRAVENOUS | Status: AC
  Administered 2022-03-31: 1000 mL via INTRAVENOUS

## 2022-03-31 MED ORDER — ONDANSETRON HCL 4 MG/2ML IJ SOLN
4.0000 mg | Freq: Four times a day (QID) | INTRAMUSCULAR | Status: DC | PRN
  Administered 2022-04-01: 4 mg via INTRAVENOUS
  Filled 2022-03-31: qty 2

## 2022-03-31 MED ORDER — SOD CITRATE-CITRIC ACID 500-334 MG/5ML PO SOLN
30.0000 mL | ORAL | Status: DC | PRN
  Administered 2022-04-01: 30 mL via ORAL
  Filled 2022-03-31: qty 30

## 2022-03-31 MED ORDER — OXYCODONE-ACETAMINOPHEN 5-325 MG PO TABS: 1.0000 | ORAL_TABLET | ORAL | Status: DC | PRN

## 2022-03-31 MED ORDER — TERBUTALINE SULFATE 1 MG/ML IJ SOLN
0.2500 mg | Freq: Once | INTRAMUSCULAR | Status: AC
  Administered 2022-04-01: 0.25 mg via SUBCUTANEOUS
  Filled 2022-03-31: qty 1

## 2022-03-31 MED ORDER — OXYTOCIN-SODIUM CHLORIDE 30-0.9 UT/500ML-% IV SOLN
2.5000 [IU]/h | INTRAVENOUS | Status: DC
  Filled 2022-03-31: qty 500

## 2022-03-31 MED ORDER — ACETAMINOPHEN 325 MG PO TABS: 650.0000 mg | ORAL_TABLET | ORAL | Status: DC | PRN

## 2022-03-31 MED ORDER — BUTORPHANOL TARTRATE 1 MG/ML IJ SOLN
1.0000 mg | INTRAMUSCULAR | Status: DC | PRN
  Administered 2022-04-01: 1 mg via INTRAVENOUS
  Filled 2022-03-31: qty 1

## 2022-03-31 MED ORDER — LACTATED RINGERS IV SOLN: INTRAVENOUS | Status: DC

## 2022-03-31 MED ORDER — LACTATED RINGERS IV SOLN: 500.0000 mL | INTRAVENOUS | Status: DC | PRN

## 2022-03-31 MED ORDER — VALACYCLOVIR HCL 500 MG PO TABS: 1000.0000 mg | ORAL_TABLET | Freq: Every day | ORAL | Status: DC

## 2022-03-31 NOTE — MAU Provider Note (Signed)
Chief Complaint: Abdominal Pain ? ? Event Date/Time  ? First Provider Initiated Contact with Patient 03/31/22 2253   ?  ? ?SUBJECTIVE ?HPI: Debra [name] is a 41 y.o. G1P0 at 35w1dby date of embryo transfer who presents to maternity admissions reporting onset of upper abdominal pain early today after eating fast food, then at 2 pm some thin discharge with white flecks, followed by more yellow and mucus like discharge and onset of lower abdominal cramping every 4-5 minutes. ? ? ?HPI ? ?Past Medical History:  ?Diagnosis Date  ? Acne 11/02/2016  ? Allergic rhinitis 11/02/2016  ? Chronic low back pain without sciatica 01/03/2017  ? Cold sore 11/02/2016  ? Depressive disorder 11/25/2014  ? Dermatographia 11/02/2016  ? Esophageal reflux 12/02/2013  ? Microscopic hematuria 11/02/2016  ? Morbid obesity with BMI of 40.0-44.9, adult ([facility] 11/02/2016  ? ?No past surgical history on file. ?Social History  ? ?Socioeconomic History  ? Marital status: Married  ?  Spouse name: Not on file  ? Number of children: Not on file  ? Years of education: Not on file  ? Highest education level: Not on file  ?Occupational History  ? Not on file  ?Tobacco Use  ? Smoking status: Never  ? Smokeless tobacco: Never  ?Vaping Use  ? Vaping Use: Never used  ?Substance and Sexual Activity  ? Alcohol use: Not Currently  ?  Comment: 1-5/WK  ? Drug use: Never  ? Sexual activity: Yes  ?  Partners: Male  ?Other Topics Concern  ? Not on file  ?Social History Narrative  ? Not on file  ? ?Social Determinants of Health  ? ?Financial Resource Strain: Not on file  ?Food Insecurity: Not on file  ?Transportation Needs: Not on file  ?Physical Activity: Not on file  ?Stress: Not on file  ?Social Connections: Not on file  ?Intimate Partner Violence: Not on file  ? ?No current facility-administered medications on file prior to encounter.  ? ?Current Outpatient Medications on File Prior to Encounter  ?Medication Sig Dispense Refill  ? aspirin EC 81 MG tablet  Take 81 mg by mouth daily. Swallow whole.    ? Cholecalciferol (VITAMIN D3 PO) Take by mouth daily.     ? Ergocalciferol (VITAMIN D2 PO) Take by mouth daily.     ? lansoprazole (PREVACID) 30 MG capsule Take 30 mg by mouth daily.     ? norethindrone (AYGESTIN) 5 MG tablet Take 5 mg by mouth daily.    ? Prenatal Vit-Fe Fumarate-FA (PRENATAL MULTIVITAMIN) TABS tablet Take 1 tablet by mouth daily at 12 noon.    ? valACYclovir (VALTREX) 1000 MG tablet TAKE 1 TABLET BY MOUTH  DAILY 90 tablet 3  ? budesonide (RHINOCORT AQUA) 32 MCG/ACT nasal spray Place 1 spray into both nostrils daily as needed. 8.43 mL 5  ? cyclobenzaprine (FLEXERIL) 10 MG tablet Take 0.5-1 tablets (5-10 mg total) by mouth 3 (three) times daily as needed for muscle spasms. 270 tablet 0  ? fluticasone (FLONASE) 50 MCG/ACT nasal spray One spray in each nostril twice a day, use left hand for right nostril, and right hand for left nostril. (Patient taking differently: Place 2 sprays into both nostrils daily. One spray in each nostril twice a day, use left hand for right nostril, and right hand for left nostril.) 48 g 3  ? Olopatadine HCl (PATADAY) 0.2 % SOLN Apply 1 drop to eye daily as needed. 2.5 mL 5  ? traZODone (DESYREL) 50 MG tablet  Take 0.5-1 tablets (25-50 mg total) by mouth at bedtime as needed for sleep. 90 tablet 3  ? triamcinolone cream (KENALOG) 0.5 % Apply 1 application topically 2 (two) times daily. To affected areas. 30 g 3  ? ?No Known Allergies ? ?ROS:  ?Review of Systems  ?Constitutional:  Negative for chills, fatigue and fever.  ?Eyes:  Negative for visual disturbance.  ?Respiratory:  Negative for shortness of breath.   ?Cardiovascular:  Negative for chest pain.  ?Gastrointestinal:  Positive for abdominal pain. Negative for nausea and vomiting.  ?Genitourinary:  Positive for vaginal discharge. Negative for difficulty urinating, dysuria, flank pain, pelvic pain, vaginal bleeding and vaginal pain.  ?Neurological:  Negative for dizziness and  headaches.  ?Psychiatric/Behavioral: Negative.    ? ? ?I have reviewed patient's Past Medical Hx, Surgical Hx, Family Hx, Social Hx, medications and allergies.  ? ?Physical Exam  ?Patient Vitals for the past 24 hrs: ? BP Temp Temp src Pulse Resp Height Weight  ?03/31/22 2205 (!) 143/74 -- -- (!) 117 -- -- --  ?03/31/22 2132 (!) 150/84 98.6 ?F (37 ?C) Oral (!) 132 20 '5\' 5"'$  (1.651 m) 127.6 kg  ? ?Constitutional: Well-developed, well-nourished female in no acute distress.  ?Cardiovascular: normal rate ?Respiratory: normal effort ?GI: Abd soft, non-tender. Pos BS x 4 ?MS: Extremities nontender, no edema, normal ROM ?Neurologic: Alert and oriented x 4.  ?GU: Neg CVAT. ? ?PELVIC EXAM: Speculum placed and small amount clear fluid pooling in posterior fornix of vagina and cervix visually open, 2-3 cm dilated with fetal head and BBOW visible and bulging through cervix. ? ?FHT 181 by doppler ? ?LAB RESULTS ?Results for orders placed or performed during the hospital encounter of 03/31/22 (from the past 24 hour(s))  ?Urinalysis, Routine w reflex microscopic Urine, Clean Catch     Status: Abnormal  ? Collection Time: 03/31/22 10:02 PM  ?Result Value Ref Range  ? Color, Urine STRAW (A) YELLOW  ? APPearance CLEAR CLEAR  ? Specific Gravity, Urine 1.003 (L) 1.005 - 1.030  ? pH 6.0 5.0 - 8.0  ? Glucose, UA NEGATIVE NEGATIVE mg/dL  ? Hgb urine dipstick NEGATIVE NEGATIVE  ? Bilirubin Urine NEGATIVE NEGATIVE  ? Ketones, ur 20 (A) NEGATIVE mg/dL  ? Protein, ur NEGATIVE NEGATIVE mg/dL  ? Nitrite NEGATIVE NEGATIVE  ? Leukocytes,Ua MODERATE (A) NEGATIVE  ? RBC / HPF 0-5 0 - 5 RBC/hpf  ? WBC, UA 11-20 0 - 5 WBC/hpf  ? Bacteria, UA RARE (A) NONE SEEN  ? Squamous Epithelial / LPF 0-5 0 - 5  ? Mucus PRESENT   ? ? ?  ? ?IMAGING ?No results found. ? ?MAU Management/MDM: ?Orders Placed This Encounter  ?Procedures  ? Urinalysis, Routine w reflex microscopic Urine, Clean Catch  ? Amnisure rupture of membrane (rom)not at [facility]  ?  ?Meds ordered this  encounter  ?Medications  ? lactated ringers bolus 1,000 mL  ?  ?Called Dr Benjie Karvonen with assessment and findings.  Discussed poor prognosis with patient at time of exam. Pt given opportunity to ask questions.  Pt placed in trendelenburg, IV placed with LR bolus.   ? ?ASSESSMENT ?1. Preterm labor in second trimester without delivery   ?2. [redacted] weeks gestation of pregnancy   ? ? ?PLAN ?Admit to [facility] ?Dr Benjie Karvonen to bedside in MAU ? ? ?Lattie Haw Leftwich-Kirby ?Certified Nurse-Midwife ?03/31/2022  ?10:58 PM ? ? ?  ?

## 2022-03-31 NOTE — MAU Note (Signed)
ON Monday- SAW DR TAAVON- 20 WEEK ANATOMY- SAID HER CERVIX WAS BIG- CLOSED ? ?IS [place] TO RETN ON Monday 17TH  FOR ANOTHER U/S ?TUES- WED OK ? ?TODAY AT 230PM- WIPED - SPOTS OF [place]- ?APPOINTMENT AT 4PM ?6P- WENT TO B-ROOM- YELLOW MUCUS ?ALL DAY - DULL ABD PAIN ?7PM- CONSISTENT DULL PAIN LOWER ABD - Q3-4 MIN ?7PM- FELT CHILLS AND NAUSEA ? ?NOW IN TRIAGE-FEELS CONSTANT DULL PAIN , LESS NAUSEA, HAS CHILLS , SM AMT D/C IN LOBBY ?

## 2022-04-01 ENCOUNTER — Encounter (HOSPITAL_COMMUNITY)
Admission: AD | Disposition: A | Discharge status: Home / Self Care | Payer: Self-pay | Source: Home / Self Care | Attending: Obstetrics & Gynecology

## 2022-04-01 ENCOUNTER — Encounter (HOSPITAL_COMMUNITY): Payer: Self-pay | Admitting: Obstetrics & Gynecology

## 2022-04-01 ENCOUNTER — Inpatient Hospital Stay (HOSPITAL_BASED_OUTPATIENT_CLINIC_OR_DEPARTMENT_OTHER): Payer: 59

## 2022-04-01 ENCOUNTER — Other Ambulatory Visit: Payer: Self-pay

## 2022-04-01 ENCOUNTER — Inpatient Hospital Stay (HOSPITAL_COMMUNITY): Payer: 59 | Admitting: Anesthesiology

## 2022-04-01 DIAGNOSIS — O3432 Maternal care for cervical incompetence, second trimester: Secondary | ICD-10-CM | POA: Diagnosis present

## 2022-04-01 DIAGNOSIS — O42912 Preterm premature rupture of membranes, unspecified as to length of time between rupture and onset of labor, second trimester: Secondary | ICD-10-CM

## 2022-04-01 DIAGNOSIS — O99891 Other specified diseases and conditions complicating pregnancy: Secondary | ICD-10-CM | POA: Diagnosis not present

## 2022-04-01 DIAGNOSIS — R109 Unspecified abdominal pain: Secondary | ICD-10-CM

## 2022-04-01 DIAGNOSIS — Z3A20 20 weeks gestation of pregnancy: Secondary | ICD-10-CM

## 2022-04-01 DIAGNOSIS — O09292 Supervision of pregnancy with other poor reproductive or obstetric history, second trimester: Secondary | ICD-10-CM

## 2022-04-01 DIAGNOSIS — O321XX0 Maternal care for breech presentation, not applicable or unspecified: Secondary | ICD-10-CM

## 2022-04-01 HISTORY — DX: Supervision of pregnancy with other poor reproductive or obstetric history, second trimester: O09.292

## 2022-04-01 HISTORY — DX: Maternal care for cervical incompetence, second trimester: O34.32

## 2022-04-01 HISTORY — PX: DILATION AND CURETTAGE OF UTERUS: SHX78

## 2022-04-01 LAB — CBC
HCT: 28.4 % — ABNORMAL LOW (ref 36.0–46.0)
HCT: 39.5 % (ref 36.0–46.0)
Hemoglobin: 12.5 g/dL (ref 12.0–15.0)
Hemoglobin: 9.2 g/dL — ABNORMAL LOW (ref 12.0–15.0)
MCH: 25.7 pg — ABNORMAL LOW (ref 26.0–34.0)
MCH: 26.2 pg (ref 26.0–34.0)
MCHC: 31.6 g/dL (ref 30.0–36.0)
MCHC: 32.4 g/dL (ref 30.0–36.0)
MCV: 80.9 fL (ref 80.0–100.0)
MCV: 81.1 fL (ref 80.0–100.0)
Platelets: 220 10*3/uL (ref 150–400)
Platelets: 239 10*3/uL (ref 150–400)
RBC: 3.51 MIL/uL — ABNORMAL LOW (ref 3.87–5.11)
RBC: 4.87 MIL/uL (ref 3.87–5.11)
RDW: 14.9 % (ref 11.5–15.5)
RDW: 15.1 % (ref 11.5–15.5)
WBC: 15.6 10*3/uL — ABNORMAL HIGH (ref 4.0–10.5)
WBC: 32.7 10*3/uL — ABNORMAL HIGH (ref 4.0–10.5)
nRBC: 0 % (ref 0.0–0.2)
nRBC: 0 % (ref 0.0–0.2)

## 2022-04-01 LAB — POCT I-STAT 7, (LYTES, BLD GAS, ICA,H+H)
Acid-base deficit: 6 mmol/L — ABNORMAL HIGH (ref 0.0–2.0)
Bicarbonate: 21.5 mmol/L (ref 20.0–28.0)
Calcium, Ion: 1.24 mmol/L (ref 1.15–1.40)
HCT: 31 % — ABNORMAL LOW (ref 36.0–46.0)
Hemoglobin: 10.5 g/dL — ABNORMAL LOW (ref 12.0–15.0)
O2 Saturation: 89 %
Patient temperature: 38.5
Potassium: 4 mmol/L (ref 3.5–5.1)
Sodium: 137 mmol/L (ref 135–145)
TCO2: 23 mmol/L (ref 22–32)
pCO2 arterial: 53.7 mmHg — ABNORMAL HIGH (ref 32–48)
pH, Arterial: 7.218 — ABNORMAL LOW (ref 7.35–7.45)
pO2, Arterial: 73 mmHg — ABNORMAL LOW (ref 83–108)

## 2022-04-01 LAB — TYPE AND SCREEN
ABO/RH(D): O POS
Antibody Screen: NEGATIVE

## 2022-04-01 SURGERY — DILATION AND CURETTAGE
Anesthesia: General

## 2022-04-01 MED ORDER — SODIUM CHLORIDE 0.9 % IV SOLN
2.0000 g | Freq: Once | INTRAVENOUS | Status: AC
  Administered 2022-04-01: 2 g via INTRAVENOUS
  Filled 2022-04-01 (×3): qty 2

## 2022-04-01 MED ORDER — ONDANSETRON HCL 4 MG/2ML IJ SOLN
INTRAMUSCULAR | Status: DC | PRN
  Administered 2022-04-01: 4 mg via INTRAVENOUS

## 2022-04-01 MED ORDER — ZOLPIDEM TARTRATE 5 MG PO TABS: 5.0000 mg | ORAL_TABLET | Freq: Every evening | ORAL | Status: DC | PRN

## 2022-04-01 MED ORDER — DEXMEDETOMIDINE (PRECEDEX) IN NS 20 MCG/5ML (4 MCG/ML) IV SYRINGE: PREFILLED_SYRINGE | INTRAVENOUS | Status: DC | PRN

## 2022-04-01 MED ORDER — CEFAZOLIN SODIUM-DEXTROSE 2-3 GM-%(50ML) IV SOLR
INTRAVENOUS | Status: DC | PRN
  Administered 2022-04-01: 2 g via INTRAVENOUS

## 2022-04-01 MED ORDER — ALBUMIN HUMAN 5 % IV SOLN: INTRAVENOUS | Status: DC | PRN

## 2022-04-01 MED ORDER — PHENYLEPHRINE HCL-NACL 20-0.9 MG/250ML-% IV SOLN
INTRAVENOUS | Status: DC | PRN
  Administered 2022-04-01: 30 ug/min via INTRAVENOUS

## 2022-04-01 MED ORDER — TRANEXAMIC ACID-NACL 1000-0.7 MG/100ML-% IV SOLN
INTRAVENOUS | Status: AC
  Filled 2022-04-01: qty 100

## 2022-04-01 MED ORDER — KETOROLAC TROMETHAMINE 30 MG/ML IJ SOLN
30.0000 mg | Freq: Once | INTRAMUSCULAR | Status: AC
  Administered 2022-04-01: 30 mg via INTRAVENOUS
  Filled 2022-04-01: qty 1

## 2022-04-01 MED ORDER — MIDAZOLAM HCL 2 MG/2ML IJ SOLN
INTRAMUSCULAR | Status: AC
  Filled 2022-04-01: qty 2

## 2022-04-01 MED ORDER — BUTORPHANOL TARTRATE 1 MG/ML IJ SOLN
1.0000 mg | INTRAMUSCULAR | Status: DC | PRN
  Administered 2022-04-01 (×3): 1 mg via INTRAVENOUS
  Filled 2022-04-01 (×3): qty 1

## 2022-04-01 MED ORDER — FENTANYL CITRATE (PF) 100 MCG/2ML IJ SOLN
INTRAMUSCULAR | Status: DC | PRN
  Administered 2022-04-01: 100 ug via INTRAVENOUS
  Administered 2022-04-01: 50 ug via INTRAVENOUS

## 2022-04-01 MED ORDER — SUCCINYLCHOLINE CHLORIDE 200 MG/10ML IV SOSY
PREFILLED_SYRINGE | INTRAVENOUS | Status: AC
Start: 2022-04-01 — End: ?
  Filled 2022-04-01: qty 10

## 2022-04-01 MED ORDER — FENTANYL CITRATE (PF) 100 MCG/2ML IJ SOLN
100.0000 ug | Freq: Once | INTRAMUSCULAR | Status: AC
  Administered 2022-04-01: 100 ug via INTRAVENOUS

## 2022-04-01 MED ORDER — COCONUT OIL OIL: 1.0000 "application " | TOPICAL_OIL | Status: DC | PRN

## 2022-04-01 MED ORDER — DEXMEDETOMIDINE (PRECEDEX) IN NS 20 MCG/5ML (4 MCG/ML) IV SYRINGE
PREFILLED_SYRINGE | INTRAVENOUS | Status: DC | PRN
  Administered 2022-04-01: 8 ug via INTRAVENOUS

## 2022-04-01 MED ORDER — DEXMEDETOMIDINE (PRECEDEX) IN NS 20 MCG/5ML (4 MCG/ML) IV SYRINGE
PREFILLED_SYRINGE | INTRAVENOUS | Status: AC
Start: 2022-04-01 — End: ?
  Filled 2022-04-01: qty 5

## 2022-04-01 MED ORDER — DIPHENHYDRAMINE HCL 25 MG PO CAPS: 25.0000 mg | ORAL_CAPSULE | Freq: Four times a day (QID) | ORAL | Status: DC | PRN

## 2022-04-01 MED ORDER — ACETAMINOPHEN 325 MG PO TABS: 650.0000 mg | ORAL_TABLET | ORAL | Status: DC | PRN

## 2022-04-01 MED ORDER — FENTANYL CITRATE (PF) 100 MCG/2ML IJ SOLN
INTRAMUSCULAR | Status: AC
  Filled 2022-04-01: qty 2

## 2022-04-01 MED ORDER — ACETAMINOPHEN 10 MG/ML IV SOLN
INTRAVENOUS | Status: AC
  Filled 2022-04-01: qty 100

## 2022-04-01 MED ORDER — TRANEXAMIC ACID-NACL 1000-0.7 MG/100ML-% IV SOLN
INTRAVENOUS | Status: DC | PRN
  Administered 2022-04-01: 1000 mg via INTRAVENOUS

## 2022-04-01 MED ORDER — METHYLERGONOVINE MALEATE 0.2 MG PO TABS
0.2000 mg | ORAL_TABLET | Freq: Once | ORAL | Status: AC
Start: 2022-04-01 — End: 2022-04-01
  Administered 2022-04-01: 0.2 mg via ORAL
  Filled 2022-04-01: qty 1

## 2022-04-01 MED ORDER — SUCCINYLCHOLINE CHLORIDE 200 MG/10ML IV SOSY
PREFILLED_SYRINGE | INTRAVENOUS | Status: DC | PRN
  Administered 2022-04-01: 120 mg via INTRAVENOUS

## 2022-04-01 MED ORDER — PROPOFOL 10 MG/ML IV BOLUS
INTRAVENOUS | Status: DC | PRN
  Administered 2022-04-01: 170 mg via INTRAVENOUS
  Administered 2022-04-01: 50 mg via INTRAVENOUS

## 2022-04-01 MED ORDER — PROPOFOL 10 MG/ML IV BOLUS
INTRAVENOUS | Status: AC
  Filled 2022-04-01: qty 20

## 2022-04-01 MED ORDER — ONDANSETRON HCL 4 MG/2ML IJ SOLN: 4.0000 mg | INTRAMUSCULAR | Status: DC | PRN

## 2022-04-01 MED ORDER — SODIUM CHLORIDE 0.9 % IV SOLN: INTRAVENOUS | Status: DC | PRN

## 2022-04-01 MED ORDER — METHYLERGONOVINE MALEATE 0.2 MG/ML IJ SOLN
INTRAMUSCULAR | Status: DC | PRN
  Administered 2022-04-01: .2 mg via INTRAMUSCULAR

## 2022-04-01 MED ORDER — IBUPROFEN 600 MG PO TABS
600.0000 mg | ORAL_TABLET | Freq: Four times a day (QID) | ORAL | Status: DC
  Administered 2022-04-01: 600 mg via ORAL
  Filled 2022-04-01 (×4): qty 1

## 2022-04-01 MED ORDER — DIBUCAINE (PERIANAL) 1 % EX OINT: 1.0000 "application " | TOPICAL_OINTMENT | CUTANEOUS | Status: DC | PRN

## 2022-04-01 MED ORDER — DEXAMETHASONE SODIUM PHOSPHATE 10 MG/ML IJ SOLN
INTRAMUSCULAR | Status: DC | PRN
  Administered 2022-04-01: 10 mg via INTRAVENOUS

## 2022-04-01 MED ORDER — CEFOXITIN SODIUM 2 G IV SOLR
2.0000 g | Freq: Once | INTRAVENOUS | Status: AC
  Administered 2022-04-01: 2 g via INTRAVENOUS
  Filled 2022-04-01: qty 2

## 2022-04-01 MED ORDER — ALBUMIN HUMAN 5 % IV SOLN
INTRAVENOUS | Status: AC
  Filled 2022-04-01: qty 250

## 2022-04-01 MED ORDER — MISOPROSTOL 200 MCG PO TABS: 400.0000 ug | ORAL_TABLET | Freq: Once | ORAL | Status: AC

## 2022-04-01 MED ORDER — SODIUM CHLORIDE 0.9 % IR SOLN
Status: DC | PRN
  Administered 2022-04-01: 1

## 2022-04-01 MED ORDER — ACETAMINOPHEN 10 MG/ML IV SOLN
1000.0000 mg | Freq: Once | INTRAVENOUS | Status: DC | PRN
  Administered 2022-04-01: 1000 mg via INTRAVENOUS

## 2022-04-01 MED ORDER — MISOPROSTOL 200 MCG PO TABS
ORAL_TABLET | ORAL | Status: AC
  Filled 2022-04-01: qty 2

## 2022-04-01 MED ORDER — LACTATED RINGERS IV SOLN: INTRAVENOUS | Status: DC

## 2022-04-01 MED ORDER — BENZOCAINE-MENTHOL 20-0.5 % EX AERO: 1.0000 "application " | INHALATION_SPRAY | CUTANEOUS | Status: DC | PRN

## 2022-04-01 MED ORDER — WITCH HAZEL-GLYCERIN EX PADS: 1.0000 "application " | MEDICATED_PAD | CUTANEOUS | Status: DC | PRN

## 2022-04-01 MED ORDER — ONDANSETRON HCL 4 MG/2ML IJ SOLN
INTRAMUSCULAR | Status: AC
  Filled 2022-04-01: qty 2

## 2022-04-01 MED ORDER — LACTATED RINGERS IV SOLN: INTRAVENOUS | Status: DC | PRN

## 2022-04-01 MED ORDER — ONDANSETRON HCL 4 MG PO TABS: 4.0000 mg | ORAL_TABLET | ORAL | Status: DC | PRN

## 2022-04-01 MED ORDER — SODIUM CHLORIDE 0.9 % IV SOLN
400.0000 mg | Freq: Once | INTRAVENOUS | Status: AC
  Administered 2022-04-01: 400 mg via INTRAVENOUS
  Filled 2022-04-01: qty 20

## 2022-04-01 MED ORDER — METHYLERGONOVINE MALEATE 0.2 MG/ML IJ SOLN
INTRAMUSCULAR | Status: AC
  Filled 2022-04-01: qty 1

## 2022-04-01 MED ORDER — FENTANYL CITRATE (PF) 100 MCG/2ML IJ SOLN: 25.0000 ug | INTRAMUSCULAR | Status: DC | PRN

## 2022-04-01 MED ORDER — TETANUS-DIPHTH-ACELL PERTUSSIS 5-2.5-18.5 LF-MCG/0.5 IM SUSY: 0.5000 mL | PREFILLED_SYRINGE | Freq: Once | INTRAMUSCULAR | Status: DC

## 2022-04-01 MED ORDER — FENTANYL CITRATE (PF) 250 MCG/5ML IJ SOLN
INTRAMUSCULAR | Status: AC
  Filled 2022-04-01: qty 5

## 2022-04-01 MED ORDER — MISOPROSTOL 200 MCG PO TABS
ORAL_TABLET | ORAL | Status: AC
  Administered 2022-04-01: 400 ug via RECTAL
  Filled 2022-04-01: qty 2

## 2022-04-01 MED ORDER — MISOPROSTOL 200 MCG PO TABS
400.0000 ug | ORAL_TABLET | Freq: Once | ORAL | Status: AC
  Administered 2022-04-01: 400 ug via BUCCAL

## 2022-04-01 MED ORDER — MIDAZOLAM HCL 2 MG/2ML IJ SOLN
INTRAMUSCULAR | Status: DC | PRN
  Administered 2022-04-01: 2 mg via INTRAVENOUS

## 2022-04-01 MED ORDER — OXYTOCIN-SODIUM CHLORIDE 30-0.9 UT/500ML-% IV SOLN
INTRAVENOUS | Status: DC | PRN
  Administered 2022-04-01: 500 mL via INTRAVENOUS

## 2022-04-01 SURGICAL SUPPLY — 18 items
CATH FOLEY LATEX FREE 14FR (CATHETERS) ×1
CATH FOLEY LF 14FR (CATHETERS) IMPLANT
CLOTH BEACON ORANGE TIMEOUT ST (SAFETY) ×1 IMPLANT
GLOVE BIO SURGEON STRL SZ7 (GLOVE) ×1 IMPLANT
GLOVE BIOGEL PI IND STRL 7.0 (GLOVE) IMPLANT
GLOVE BIOGEL PI INDICATOR 7.0 (GLOVE) ×1
GOWN STRL REUS W/TWL LRG LVL3 (GOWN DISPOSABLE) ×1 IMPLANT
KIT BERKELEY 1ST TRIMESTER 3/8 (MISCELLANEOUS) ×1 IMPLANT
MAT PREVALON FULL STRYKER (MISCELLANEOUS) ×1 IMPLANT
NS IRRIG 1000ML POUR BTL (IV SOLUTION) ×1 IMPLANT
PACK VAGINAL MINOR WOMEN LF (CUSTOM PROCEDURE TRAY) ×1 IMPLANT
PAD OB MATERNITY 4.3X12.25 (PERSONAL CARE ITEMS) ×1 IMPLANT
PAD PREP 24X48 CUFFED NSTRL (MISCELLANEOUS) ×1 IMPLANT
SET BERKELEY SUCTION TUBING (SUCTIONS) ×1 IMPLANT
SPONGE T-LAP 18X18 ~~LOC~~+RFID (SPONGE) ×1 IMPLANT
SPONGE T-LAP 4X18 ~~LOC~~+RFID (SPONGE) ×2 IMPLANT
TOWEL OR 17X24 6PK STRL BLUE (TOWEL DISPOSABLE) ×1 IMPLANT
VACURETTE 12 RIGID CVD (CANNULA) ×1 IMPLANT

## 2022-04-01 NOTE — Op Note (Signed)
04/01/2022 ?[name] ? ?Procedure: Manual removal of placenta and suction evacuation and curettage ? ?Pre-operative diagnosis: 20.2 week delivery, retained placenta at 30 minutes with hemorrhage  ?Postop diagnosis: same ?Anesthesia: General endotracheal. ?Surgeon: Azucena Fallen, MD ?Assistant: none  ?IV fluids 1400 cc LR +250 cc Albumin  ?Estimated blood loss: in labor room 500 cc + 665 cc in OR (may be a bit more)  ?Urine output:  200 cc foley (to be removed in PACU) ?Complications none ?Condition stable ?Disposition PACU  ?Specimen: placenta and curettage products to pathology. ? ?Procedure:  ?Indication: 20.2 week delivery/ incompetent cervix. Retained placenta at 30 min with EBL in room about 500 cc. So decision was made to proceed with manual removal of placenta and suction D&C in OR. Risks/ complications of surgery including infection, bleeding, damage to internal organs including uterine perforation, Asherman syndrome were reviewed. She voiced understanding and gave informed written consent. ? ?Patient was brought to the operating room with IV running. She received preop 3 gm Ancef. She underwent general endotracheal anesthesia without complications. She was given dorsolithotomy position. Parts were prepped and draped in standard fashion. Bladder was catheterized with foley.  Profuse bleeding started with general anesthesia relaxing uterine tone but on exam placenta was still high at the fundus. So I reached manually and removed it, it was not an intact removal. TXA 1 gm was started. Speculum was placed and cervix was grasped with Ring's forceps.  A #12  suction curette was introduced in the uterine cavity and suction evacuation of products of conception was done in multiple step wise fashion. Gentle sharp curettage was performed. Suction cannula was reintroduced and cavity was emptied. Methergine 0.2 mg IM given and she also received Pitocin 20 units in IV fluid.  ?Bleeding was now well controlled  and procedure was complete.  ?All counts are correct x2. Specimen products of conception. No complications from procedure. She was brought to the recovery room in stable condition after extubation. ? ?Findings d/w pt and her family.  ?I performed this surgery. ?Azucena Fallen, MD.   ?[facility] ?

## 2022-04-01 NOTE — Transfer of Care (Signed)
Immediate Anesthesia Transfer of Care Note ? ?Patient: Debra [name] ? ?Procedure(s) Performed: DILATATION AND CURETTAGE ? ?Patient Location: PACU ? ?Anesthesia Type:General ? ?Level of Consciousness: sedated ? ?Airway & Oxygen Therapy: Patient Spontanous Breathing ? ?Post-op Assessment: Report given to RN ? ?Post vital signs: Reviewed and stable ? ?Last Vitals:  ?Vitals Value Taken Time  ?BP 109/47 04/01/22 0845  ?Temp 38.3 ?C 04/01/22 0841  ?Pulse 138 04/01/22 0850  ?Resp 27 04/01/22 0850  ?SpO2 96 % 04/01/22 0850  ?Vitals shown include unvalidated device data. ? ?Last Pain:  ?Vitals:  ? 04/01/22 0841  ?TempSrc: Oral  ?PainSc: 0-No pain  ?   ? ?  ? ?Complications: No notable events documented. ?

## 2022-04-01 NOTE — Anesthesia Procedure Notes (Signed)
Procedure Name: Intubation ?Date/Time: 04/01/2022 7:50 AM ?Performed by: Asher Muir, CRNA ?Pre-anesthesia Checklist: Patient identified, Patient being monitored, Timeout performed, Emergency Drugs available and Suction available ?Patient Re-evaluated:Patient Re-evaluated prior to induction ?Oxygen Delivery Method: Circle System Utilized ?Preoxygenation: Pre-oxygenation with 100% oxygen ?Induction Type: IV induction and Rapid sequence ?Laryngoscope Size: Glidescope and 3 ?Grade View: Grade II ?Tube type: Oral ?Tube size: 7.0 mm ?Number of attempts: 1 ?Airway Equipment and Method: stylet and Video-laryngoscopy ?Placement Confirmation: ETT inserted through vocal cords under direct vision, positive ETCO2 and breath sounds checked- equal and bilateral ?Secured at: 21 cm ?Tube secured with: Tape ?Dental Injury: Teeth and Oropharynx as per pre-operative assessment  ? ? ? ? ?

## 2022-04-01 NOTE — Anesthesia Postprocedure Evaluation (Signed)
Anesthesia Post Note ? ?Patient: Debra [name] ? ?Procedure(s) Performed: DILATATION AND CURETTAGE ? ?  ? ?Patient location during evaluation: PACU ?Anesthesia Type: General ?Level of consciousness: awake and alert ?Pain management: pain level controlled ?Vital Signs Assessment: post-procedure vital signs reviewed and stable ?Respiratory status: spontaneous breathing, nonlabored ventilation, respiratory function stable and patient connected to nasal cannula oxygen ?Cardiovascular status: blood pressure returned to baseline and stable ?Postop Assessment: no apparent nausea or vomiting ?Anesthetic complications: no ? ? ?No notable events documented. ? ?Last Vitals:  ?Vitals:  ? 04/01/22 1109 04/01/22 1200  ?BP: 120/68   ?Pulse: (!) 128   ?Resp: 20   ?Temp: 37.7 ?C 37.4 ?C  ?SpO2: 96%   ?  ?Last Pain:  ?Vitals:  ? 04/01/22 1200  ?TempSrc: Axillary  ?PainSc:   ? ? ?  ?  ?  ?  ?  ?  ? ?March Rummage [name] ? ? ? ? ?

## 2022-04-01 NOTE — Progress Notes (Signed)
In PACU low grade fever 100.4  ?Likely early chorioamnionitis and possible some effect of 873mg cytotec I have in L&D to aid placenta delivery.  ?Will monitor ?Plan:  ?PP observation ?Cefoxitin 2 gm at 2 pm ?Methergine 0.2 mg PO at 2 pm ?IV Venofer '400mg'$  at 2 pm  ?CBC at 3 pm  ? ?On call MD after 1 pm Dr FPamala Hurryto assess patient for possible discharge later today or tomorrow  ?Plan d/w pt and her husband and family.  ?--V.Reggie Welge MD  ?

## 2022-04-01 NOTE — H&P (Signed)
Debra [name] is a 41 y.o. female presenting for PPROM, G1 at 20.2 wks, [facility] 08/17/22 by IVF dating.  ?Patient noted vaginal fluid around 4 pm and then again from 6 pm, clear but some white particles. No vaginal bleeding. Lower abdo pain, cramping, dull constant pain with additional waves of contraction pain and some nausea with pain and chills. Denies fever.  ?[facility] at [facility] Dr Ronita Hipps.  ?IVF pregnancy (with donor egg since failed egg retrieval)  PGS nl. No NIPT. Completed all IVF meds, now on PNVit and Pantoprazole. H/o depression, no meds. On bbASA and Valtrex for cold sore suppression.  ?She had uncomplicated visits from 11 wks.  ?Office sono at 19 wks - normal anatomy with incomplete heart images due to maternal BMI. Cervix was dynamic, 4 cm long but dilated open per report all the way in some sono clips. She was advised to have repeat cervical assessment sono on 4/17.  ? ?OB History   ? ? Gravida  ?1  ? Para  ?   ? Term  ?   ? Preterm  ?   ? AB  ?   ? Living  ?   ?  ? ? SAB  ?   ? IAB  ?   ? Ectopic  ?   ? Multiple  ?   ? Live Births  ?   ?   ?  ?  ? ?Past Medical History:  ?Diagnosis Date  ? Acne 11/02/2016  ? Allergic rhinitis 11/02/2016  ? Chronic low back pain without sciatica 01/03/2017  ? Cold sore 11/02/2016  ? Depressive disorder 11/25/2014  ? Dermatographia 11/02/2016  ? Esophageal reflux 12/02/2013  ? Microscopic hematuria 11/02/2016  ? Morbid obesity with BMI of 40.0-44.9, adult ([place]) 11/02/2016  ? Pregnancy resulting from in vitro fertilization in second trimester 03/31/2022  ? Premature rupture of membranes in second trimester 03/31/2022  ? ?No past surgical history on file. ?Family History: family history includes Allergic rhinitis in her brother, father, and mother; Breast cancer in her mother; Eczema in her mother. ?Social History:  reports that she has never smoked. She has never used smokeless tobacco. She reports that she does not currently use alcohol. She reports that she does  not use drugs. ? ? ?Review of Systems ?History ?  ?Blood pressure (!) 146/89, pulse (!) 137, temperature 98.4 ?F (36.9 ?C), temperature source Oral, resp. rate 20, height '5\' 5"'$  (1.651 m), weight 127.6 kg. ?Exam ?Physical Exam  ?A&O x 3, no acute distress. Pleasant ?HEENT neg, no thyromegaly ?Lungs CTA bilat ?CV mild tachycardia noted, S1S2 normal ?Abdo soft, gravid uterus with mild tenderness  ?Extr no edema/ tenderness ?Pelvic Per MAU CNM- fluid pooling, cervix visually 3 cm dilated, bulging membranes noted with fetal part within ?FHT  150 ?Toco present per pt ? ?Prenatal labs: ?ABO, Rh: O(+) ?Antibody: Neg ?Rubella:  Imm ?RPR:   Neg ?HBsAg:   Neg ?HIV:   Neg ?GBS:   Not done  ?Hep C at PCP Neg  ? ?Assessment/Plan: ?41 yo G1 at 20.2 wks with [facility] 08/17/22 by IVF dating. Now with PPROM and dilated cervix, early labor, likely incompetent cervix ?Amnisure + ?Currently mild tachycardia and mild tenderness, will reassess after IV hydration, Stadol for pain and terbutaline for UCs- see if infection vs labor/ anxiety from PPROM  ?Plan [place], ok bathroom for voids since she cannot void in bedpan.  ?Bedside AFI, CBC and assess if option for expectant management vs/ proceed with labor  augmentation for maternal infection and risk of sepsis.  ?She is not a candidate for rescue cerclage due to PPROM  ?We discussed previability at 20.2 wks and no Neonatal intervention/ consult until at least 23 wks.  ?Patient and husband counseled about exam/ concern/ plan.  ?Admitting to L&D since 20+ wks.  ? ?Elveria Royals ?04/01/2022, 12:10 AM ? ? ? ? ?

## 2022-04-01 NOTE — Progress Notes (Signed)
Physically feels fine, sad/ crying. No abdominal pain, no cramping. Pt states no current fevers. ? ?O: ?Vitals:  ? 04/01/22 1000 04/01/22 1109 04/01/22 1200 04/01/22 1528  ?BP: (!) 102/53 120/68  102/67  ?Pulse: (!) 141 (!) 128  (!) 104  ?Resp:  20    ?Temp:  99.9 ?F (37.7 ?C) 99.4 ?F (37.4 ?C) 98.1 ?F (36.7 ?C)  ?TempSrc:  Oral Axillary Oral  ?SpO2:  96%  100%  ?Weight:      ?Height:      ? ?Gen: no distress ?Abd: obese, no uterine tenderness ?LE: no edema ? ? ?  Latest Ref Rng & Units 04/01/2022  ?  2:42 PM 04/01/2022  ?  8:26 AM 03/31/2022  ? 11:34 PM  ?CBC  ?WBC 4.0 - 10.5 K/uL 32.7    15.6    ?Hemoglobin 12.0 - 15.0 g/dL 9.2   10.5   12.5    ?Hematocrit 36.0 - 46.0 % 28.4   31.0   39.5    ?Platelets 150 - 400 K/uL 220    239    ? ?A/P: PPD#0 s/p SVD, non-viable due to preterm PROM, POD#0 D&C for retained placenta with chorioamnionitis. Fevers and tachycardia improving but very elevated WBC and recc to stay in house for continued abx and monitoring of blood loss, vitals and clinical exam ?- anemia. S/p IV iron after PPH today ?- h/o depression, warning sx reviewed with pt.  ?- obesity, place SCDs o/n ? ?Ala Dach ?04/01/2022 5:07 PM  ? ?

## 2022-04-01 NOTE — Anesthesia Preprocedure Evaluation (Signed)
Anesthesia Evaluation  ?Patient identified by MRN, date of birth, ID band ?Patient awake ? ? ? ?Reviewed: ?Allergy & Precautions, NPO status , Patient's Chart, lab work & pertinent test results ? ?Airway ?Mallampati: III ? ?TM Distance: >3 FB ?Neck ROM: Full ? ? ? Dental ?no notable dental hx. ? ?  ?Pulmonary ?sleep apnea and Continuous Positive Airway Pressure Ventilation ,  ?  ?Pulmonary exam normal ? ? ? ? ? ? ? Cardiovascular ?negative cardio ROS ? ? ?Rhythm:Regular Rate:Normal ? ? ?  ?Neuro/Psych ?Depression negative neurological ROS ?   ? GI/Hepatic ?Neg liver ROS, GERD  Medicated,  ?Endo/Other  ?Morbid obesity ? Renal/GU ?negative Renal ROS  ?negative genitourinary ?  ?Musculoskeletal ?negative musculoskeletal ROS ?(+)  ? Abdominal ?Normal abdominal exam  (+)   ?Peds ? Hematology ?negative hematology ROS ?(+)   ?Anesthesia Other Findings ? ? Reproductive/Obstetrics ?D/C for retained POC. Fetal demise 20w ? ?  ? ? ? ? ? ? ? ? ? ? ? ? ? ?  ?  ? ? ? ? ? ? ? ? ?Anesthesia Physical ?Anesthesia Plan ? ?ASA: 3 ? ?Anesthesia Plan: General  ? ?Post-op Pain Management:   ? ?Induction: Intravenous ? ?PONV Risk Score and Plan: 3 and Ondansetron, Dexamethasone, Midazolam and Treatment may vary due to age or medical condition ? ?Airway Management Planned: Mask and Oral ETT ? ?Additional Equipment: None ? ?Intra-op Plan:  ? ?Post-operative Plan: Extubation in OR ? ?Informed Consent: I have reviewed the patients History and Physical, chart, labs and discussed the procedure including the risks, benefits and alternatives for the proposed anesthesia with the patient or authorized representative who has indicated his/her understanding and acceptance.  ? ? ? ?Dental advisory given ? ?Plan Discussed with: CRNA ? ?Anesthesia Plan Comments: (Lab Results ?     Component                Value               Date                 ?     WBC                      15.6 (H)            03/31/2022           ?      HGB                      12.5                03/31/2022           ?     HCT                      39.5                03/31/2022           ?     MCV                      81.1                03/31/2022           ?     PLT  239                 03/31/2022           ?Lab Results ?     Component                Value               Date                 ?     NA                       137                 06/03/2021           ?     K                        4.4                 06/03/2021           ?     CO2                      23                  06/03/2021           ?     GLUCOSE                  79                  06/03/2021           ?     BUN                      15                  06/03/2021           ?     CREATININE               0.76                06/03/2021           ?     CALCIUM                  9.7                 06/03/2021           ?     GFRNONAA                 99                  06/03/2021          )  ? ? ? ? ? ? ?Anesthesia Quick Evaluation ? ?

## 2022-04-02 LAB — CBC WITH DIFFERENTIAL/PLATELET
Abs Immature Granulocytes: 0 10*3/uL (ref 0.00–0.07)
Basophils Absolute: 0 10*3/uL (ref 0.0–0.1)
Basophils Relative: 0 %
Eosinophils Absolute: 0.3 10*3/uL (ref 0.0–0.5)
Eosinophils Relative: 1 %
HCT: 23.1 % — ABNORMAL LOW (ref 36.0–46.0)
Hemoglobin: 7.2 g/dL — ABNORMAL LOW (ref 12.0–15.0)
Lymphocytes Relative: 7 %
Lymphs Abs: 2 10*3/uL (ref 0.7–4.0)
MCH: 25.7 pg — ABNORMAL LOW (ref 26.0–34.0)
MCHC: 31.2 g/dL (ref 30.0–36.0)
MCV: 82.5 fL (ref 80.0–100.0)
Monocytes Absolute: 0.9 10*3/uL (ref 0.1–1.0)
Monocytes Relative: 3 %
Neutro Abs: 25.8 10*3/uL — ABNORMAL HIGH (ref 1.7–7.7)
Neutrophils Relative %: 89 %
Platelets: 196 10*3/uL (ref 150–400)
RBC: 2.8 MIL/uL — ABNORMAL LOW (ref 3.87–5.11)
RDW: 15.3 % (ref 11.5–15.5)
WBC: 29 10*3/uL — ABNORMAL HIGH (ref 4.0–10.5)
nRBC: 0 % (ref 0.0–0.2)
nRBC: 0 /100 WBC

## 2022-04-02 MED ORDER — IBUPROFEN 600 MG PO TABS: 600.0000 mg | ORAL_TABLET | Freq: Four times a day (QID) | ORAL | 0 refills | Status: DC

## 2022-04-02 MED ORDER — SODIUM CHLORIDE 0.9 % IV SOLN
2.0000 g | Freq: Four times a day (QID) | INTRAVENOUS | Status: DC
  Administered 2022-04-02: 2 g via INTRAVENOUS
  Filled 2022-04-02 (×2): qty 2

## 2022-04-02 MED ORDER — ACETAMINOPHEN 325 MG PO TABS: 650.0000 mg | ORAL_TABLET | ORAL | 1 refills | Status: DC | PRN

## 2022-04-02 NOTE — Discharge Summary (Signed)
?   ?  OB Discharge Summary ? ?Patient Name: Debra [name] ?DOB: [birth year] ?MRN: [id number] ? ?Date of admission: 03/31/2022 ?Delivering MD: MODY, VAISHALI  ? ?Date of discharge: 04/02/2022 ? ?Admitting diagnosis: Preterm premature rupture of membranes (PPROM) with onset of labor after 24 hours of rupture in second trimester, antepartum [O42.112] ?Preterm delivery [O60.10X0] ?Intrauterine pregnancy: [redacted]w[redacted]d    ?Secondary diagnosis:Principal Problem: ?  Premature rupture of membranes in second trimester ?Active Problems: ?  Pregnancy resulting from in vitro fertilization in second trimester ?  Preterm delivery ?  Incompetent cervix in pregnancy, antepartum, second trimester ? ?Additional problems:PPH, acute blood loss anemia, chorioamnionitis, retained placenta  ?   ?Discharge diagnosis: Preterm Pregnancy Delivered, Anemia, PPH, and fetal demise, cervical incompetence        ?                                                              ?Post partum procedures:curettage  ? ?Augmentation: Cytotec ? ?Complications: Intrauterine Inflammation or infection (Chorioamniotis), Hemorrhage>1004m and Stillborn ? ?Hospital course:  Onset of Labor With Vaginal Delivery      ?[age].o. yo G1P1 at 203w2ds admitted in Latent Labor on 03/31/2022.  Initial plan for expectant management but  uterine tendernenss, contractions and labor ensured. Vaginal breech delivered with head entrapment relieved with cytotec and [place]ant [place]angement. [place] for retained placenta. Given fevers and tachycardia pt given mefoxin for chorioamnionitis and IV iron. PPD#0 with elevated WBC and kept o/n for continued IV abx ? ?Membrane Rupture Time/Date: 6:22 AM ,04/01/2022   ?Delivery Method:Vaginal, Breech  ?Episiotomy: None  ?Lacerations:  None  ? ?On PPD/POD#1 pt notes minimal initial dizziness when standing but otherwise OK to ambulated w/o significant sx. Pt notes no abdominal pain, minimal b leeding, no fevers. No CP/ no  SOB. Pt grieving appropriately with good support.  ? ?Pt understands si/sx PPD, hemorrhage and chorio. Given her orthostasis and tachycardia blood transfusion recommended for symptoms but declined by pt.  ? ?She is ambulating, tolerating a regular diet, passing flatus, and urinating well. Patient is discharged home in stable condition on 04/02/22. ? ?Newborn Data: ?Birth date:04/01/2022  ?Birth time:6:44 AM  ?Gender:Female  ?Living status:Fetal Demise  ?Apgars:0 ,0  ?Weight:365 g  ? ?Physical exam  ?Vitals:  ? 04/01/22 1947 04/01/22 2239 04/02/22 0451 04/02/22 0919  ?BP: (!) 128/45 111/67 (!) 118/54 (!) 118/58  ?Pulse: 91 (!) 110 100 (!) 103  ?Resp: '18 18 18 18  '$ ?Temp: 98.3 ?F (36.8 ?C) 97.9 ?F (36.6 ?C) 98.4 ?F (36.9 ?C) 98.2 ?F (36.8 ?C)  ?TempSrc: Oral Oral Oral Oral  ?SpO2: 100% 98% 98% 98%  ?Weight:      ?Height:      ? ?General: alert, cooperative, and no distress ?Lochia: appropriate ?Uterine Fundus: firm ?Incision: N/A ?DVT Evaluation: No evidence of DVT seen on physical exam. ?Labs: ?Lab Results  ?Component Value Date  ? WBC 29.0 (H) 04/02/2022  ? HGB 7.2 (L) 04/02/2022  ? HCT 23.1 (L) 04/02/2022  ? MCV 82.5 04/02/2022  ? PLT 196 04/02/2022  ? ? ?  Latest Ref Rng & Units 04/01/2022  ?  8:26 AM  ?CMP  ?Sodium 135 - 145 mmol/L 137    ?Potassium 3.5 -  5.1 mmol/L 4.0    ? ? ?Discharge instruction: per After Visit Summary and "Baby and Me Booklet". ? ?After Visit Meds:  ?Allergies as of 04/02/2022   ?No Known Allergies ?  ? ?  ?Medication List  ?  ? ?STOP taking these medications   ? ?norethindrone 5 MG tablet ?Commonly known as: AYGESTIN ?  ? ?  ? ?TAKE these medications   ? ?acetaminophen 325 MG tablet ?Commonly known as: Tylenol ?Take 2 tablets (650 mg total) by mouth every 4 (four) hours as needed (for pain scale < 4). ?  ?aspirin EC 81 MG tablet ?Take 81 mg by mouth daily. Swallow whole. ?  ?budesonide 32 MCG/ACT nasal spray ?Commonly known as: RHINOCORT AQUA ?Place 1 spray into both nostrils daily as  needed. ?  ?cyclobenzaprine 10 MG tablet ?Commonly known as: FLEXERIL ?Take 0.5-1 tablets (5-10 mg total) by mouth 3 (three) times daily as needed for muscle spasms. ?  ?fluticasone 50 MCG/ACT nasal spray ?Commonly known as: Flonase ?One spray in each nostril twice a day, use left hand for right nostril, and right hand for left nostril. ?What changed:  ?how much to take ?how to take this ?when to take this ?  ?ibuprofen 600 MG tablet ?Commonly known as: ADVIL ?Take 1 tablet (600 mg total) by mouth every 6 (six) hours. ?  ?lansoprazole 30 MG capsule ?Commonly known as: PREVACID ?Take 30 mg by mouth daily. ?  ?Olopatadine HCl 0.2 % Soln ?Commonly known as: Pataday ?Apply 1 drop to eye daily as needed. ?  ?prenatal multivitamin Tabs tablet ?Take 1 tablet by mouth daily at 12 noon. ?  ?traZODone 50 MG tablet ?Commonly known as: DESYREL ?Take 0.5-1 tablets (25-50 mg total) by mouth at bedtime as needed for sleep. ?  ?triamcinolone cream 0.5 % ?Commonly known as: KENALOG ?Apply 1 application topically 2 (two) times daily. To affected areas. ?  ?valACYclovir 1000 MG tablet ?Commonly known as: VALTREX ?TAKE 1 TABLET BY MOUTH  DAILY ?  ?VITAMIN D2 PO ?Take by mouth daily. ?  ?VITAMIN D3 PO ?Take by mouth daily. ?  ? ?  ? ? ?Diet: routine diet ? ?Activity: Advance as tolerated. Pelvic rest for 6 weeks.  ? ?Outpatient follow up:4 weeks ?Follow up Appt:No future appointments. ?Follow up visit: No follow-ups on file. ? ?Postpartum contraception: Not Discussed ? ?Newborn Data: ?Live born female  ?Birth Weight: 12.9 oz (365 g) ?APGAR: 0, 0 ? ?Newborn Delivery   ?Birth date/time: 04/01/2022 06:44:00 ?Delivery type: Vaginal, Breech ?Breech: Spontaneous ?  ?  ? ?Baby Feeding:  n/a. Demise ?Disposition: for burial ? ? ?04/02/2022 ?Ala Dach, MD ? ? ?

## 2022-04-02 NOTE — Plan of Care (Signed)
?  Problem: Education: ?Goal: Knowledge of General Education information will improve ?Description: Including pain rating scale, medication(s)/side effects and non-pharmacologic comfort measures ?Outcome: Completed/Met ?  ?Problem: Health Behavior/Discharge Planning: ?Goal: Ability to manage health-related needs will improve ?Outcome: Completed/Met ?  ?Problem: Clinical Measurements: ?Goal: Ability to maintain clinical measurements within normal limits will improve ?Outcome: Completed/Met ?Goal: Will remain free from infection ?Outcome: Completed/Met ?Goal: Diagnostic test results will improve ?Outcome: Completed/Met ?Goal: Respiratory complications will improve ?Outcome: Completed/Met ?Goal: Cardiovascular complication will be avoided ?Outcome: Completed/Met ?  ?Problem: Activity: ?Goal: Risk for activity intolerance will decrease ?Outcome: Completed/Met ?  ?Problem: Nutrition: ?Goal: Adequate nutrition will be maintained ?Outcome: Completed/Met ?  ?Problem: Coping: ?Goal: Level of anxiety will decrease ?Outcome: Completed/Met ?  ?Problem: Elimination: ?Goal: Will not experience complications related to bowel motility ?Outcome: Completed/Met ?Goal: Will not experience complications related to urinary retention ?Outcome: Completed/Met ?  ?Problem: Pain Managment: ?Goal: General experience of comfort will improve ?Outcome: Completed/Met ?  ?Problem: Safety: ?Goal: Ability to remain free from injury will improve ?Outcome: Completed/Met ?  ?Problem: Skin Integrity: ?Goal: Risk for impaired skin integrity will decrease ?Outcome: Completed/Met ?  ?Problem: Education: ?Goal: Knowledge of Childbirth will improve ?Outcome: Completed/Met ?Goal: Ability to make informed decisions regarding treatment and plan of care will improve ?Outcome: Completed/Met ?Goal: Ability to state and carry out methods to decrease the pain will improve ?Outcome: Completed/Met ?Goal: Individualized Educational Video(s) ?Outcome: Completed/Met ?   ?Problem: Coping: ?Goal: Ability to verbalize concerns and feelings about labor and delivery will improve ?Outcome: Completed/Met ?  ?Problem: Life Cycle: ?Goal: Ability to make normal progression through stages of labor will improve ?Outcome: Completed/Met ?Goal: Ability to effectively push during vaginal delivery will improve ?Outcome: Completed/Met ?  ?Problem: Role Relationship: ?Goal: Will demonstrate positive interactions with the child ?Outcome: Completed/Met ?  ?Problem: Safety: ?Goal: Risk of complications during labor and delivery will decrease ?Outcome: Completed/Met ?  ?Problem: Pain Management: ?Goal: Relief or control of pain from uterine contractions will improve ?Outcome: Completed/Met ?  ?Problem: Education: ?Goal: Knowledge of disease or condition will improve ?Outcome: Completed/Met ?Goal: Knowledge of the prescribed therapeutic regimen will improve ?Outcome: Completed/Met ?  ?Problem: Coping: ?Goal: Ability to identify and utilize appropriate coping strategies will improve ?Outcome: Completed/Met ?Goal: Ability to identify and utilize available support systems will improve ?Outcome: Completed/Met ?Goal: Will verbalize feelings ?Outcome: Completed/Met ?Goal: Decrease level of anxiety will ?Outcome: Completed/Met ?  ?Problem: Clinical Measurements: ?Goal: Will show no signs and symptoms of excessive bleeding ?Outcome: Completed/Met ?  ?

## 2022-04-04 LAB — SURGICAL PATHOLOGY

## 2022-04-08 ENCOUNTER — Other Ambulatory Visit: Payer: Self-pay | Admitting: Obstetrics and Gynecology

## 2022-04-08 DIAGNOSIS — D649 Anemia, unspecified: Secondary | ICD-10-CM

## 2022-04-12 ENCOUNTER — Telehealth (HOSPITAL_COMMUNITY): Payer: Self-pay | Admitting: *Deleted

## 2022-04-12 NOTE — Telephone Encounter (Signed)
Patient reports healing well physically. Emotionally doing ok. Reports plenty of family support. Declined EPDS, but reports very in touch with her feelings. Agreed to have nurse send her via email the Maternal [place] sheet.  ? ?Odis Hollingshead, RN 04-12-2022 at 9:30am ?

## 2022-04-22 ENCOUNTER — Non-Acute Institutional Stay (HOSPITAL_COMMUNITY)
Admission: RE | Admit: 2022-04-22 | Discharge: 2022-04-22 | Disposition: A | Discharge status: Home / Self Care | Payer: 59 | Source: Ambulatory Visit | Attending: Internal Medicine | Admitting: Internal Medicine

## 2022-04-22 DIAGNOSIS — D649 Anemia, unspecified: Secondary | ICD-10-CM | POA: Insufficient documentation

## 2022-04-22 MED ORDER — SODIUM CHLORIDE 0.9 % IV SOLN
500.0000 mg | INTRAVENOUS | Status: DC
  Administered 2022-04-22: 500 mg via INTRAVENOUS
  Filled 2022-04-22: qty 25

## 2022-04-22 MED ORDER — SODIUM CHLORIDE 0.9 % IV SOLN
INTRAVENOUS | Status: DC | PRN
Start: 2022-04-22 — End: 2022-04-23

## 2022-04-22 MED ORDER — DIPHENHYDRAMINE HCL 25 MG PO CAPS
25.0000 mg | ORAL_CAPSULE | ORAL | Status: DC
  Administered 2022-04-22: 25 mg via ORAL
  Filled 2022-04-22: qty 1

## 2022-04-22 MED ORDER — ACETAMINOPHEN 500 MG PO TABS
500.0000 mg | ORAL_TABLET | ORAL | Status: DC
  Administered 2022-04-22: 500 mg via ORAL
  Filled 2022-04-22: qty 1

## 2022-04-22 NOTE — Progress Notes (Signed)
PATIENT CARE CENTER NOTE ? ? ?Diagnosis: Anemia, unspecified type (D64.9) ? ? ?Provider: Brien Few, MD ? ? ?Procedure: Venofer infusion  ? ? ?Note:  Patient received Venofer 500 mg infusion (dose 1 of 2) via PIV. Pre-medications (Tylenol and Benadryl) given per order. Patient tolerated infusion well. Patient reported soreness at the IV insertion site at completion of infusion but otherwise no adverse reaction to infusion noted. Vital signs stable. Discharge instructions given. Patient had previously received one dose of iron while hospitalized three weeks ago. Patient will verify with provider before scheduling next iron infusion. Alert, oriented and ambulatory at discharge.   ?

## 2022-05-07 ENCOUNTER — Other Ambulatory Visit: Payer: Self-pay | Admitting: Osteopathic Medicine

## 2022-05-12 ENCOUNTER — Other Ambulatory Visit: Payer: Self-pay | Admitting: Obstetrics and Gynecology

## 2022-05-23 ENCOUNTER — Encounter: Payer: 59 | Admitting: Family Medicine

## 2022-06-02 ENCOUNTER — Telehealth: Payer: Self-pay | Admitting: Family Medicine

## 2022-06-02 DIAGNOSIS — E7800 Pure hypercholesterolemia, unspecified: Secondary | ICD-10-CM

## 2022-06-02 DIAGNOSIS — Z0000 Encounter for general adult medical examination without abnormal findings: Secondary | ICD-10-CM

## 2022-06-02 DIAGNOSIS — R7989 Other specified abnormal findings of blood chemistry: Secondary | ICD-10-CM

## 2022-06-02 NOTE — Telephone Encounter (Signed)
Labs have been ordered. Please advise the patient and schedule a fasting lab visit. Thanks

## 2022-06-02 NOTE — Telephone Encounter (Signed)
Pt is coming in for a physical on Wednesday and would like to have her bloodwork done Friday. Please enter labs.

## 2022-06-02 NOTE — Telephone Encounter (Signed)
Spoke with pt labs have been scheduled

## 2022-06-03 ENCOUNTER — Other Ambulatory Visit: Payer: 59

## 2022-06-04 LAB — LIPID PANEL
Cholesterol: 152 mg/dL (ref <200)
HDL: 54 mg/dL (ref >=50)
LDL Cholesterol (Calc): 84 mg/dL (calc)
Non-HDL Cholesterol (Calc): 98 mg/dL (calc) (ref <130)
Total CHOL/HDL Ratio: 2.8 (calc) (ref <5.0)
Triglycerides: 62 mg/dL (ref <150)

## 2022-06-04 LAB — CBC
HCT: 40.9 % (ref 35.0–45.0)
Hemoglobin: 12.9 g/dL (ref 11.7–15.5)
MCH: 26.3 pg — ABNORMAL LOW (ref 27.0–33.0)
MCHC: 31.5 g/dL — ABNORMAL LOW (ref 32.0–36.0)
MCV: 83.5 fL (ref 80.0–100.0)
MPV: 12.3 fL (ref 7.5–12.5)
Platelets: 261 10*3/uL (ref 140–400)
RBC: 4.9 10*6/uL (ref 3.80–5.10)
RDW: 13.8 % (ref 11.0–15.0)
WBC: 6.2 10*3/uL (ref 3.8–10.8)

## 2022-06-04 LAB — COMPREHENSIVE METABOLIC PANEL
AG Ratio: 1.5 (calc) (ref 1.0–2.5)
ALT: 24 U/L (ref 6–29)
AST: 19 U/L (ref 10–30)
Albumin: 4.4 g/dL (ref 3.6–5.1)
Alkaline phosphatase (APISO): 57 U/L (ref 31–125)
BUN: 14 mg/dL (ref 7–25)
CO2: 22 mmol/L (ref 20–32)
Calcium: 9.2 mg/dL (ref 8.6–10.2)
Chloride: 108 mmol/L (ref 98–110)
Creat: 0.97 mg/dL (ref 0.50–0.99)
Globulin: 2.9 g/dL (calc) (ref 1.9–3.7)
Glucose, Bld: 84 mg/dL (ref 65–99)
Potassium: 3.8 mmol/L (ref 3.5–5.3)
Sodium: 140 mmol/L (ref 135–146)
Total Bilirubin: 0.3 mg/dL (ref 0.2–1.2)
Total Protein: 7.3 g/dL (ref 6.1–8.1)

## 2022-06-08 ENCOUNTER — Encounter: Payer: Self-pay | Admitting: Family Medicine

## 2022-06-08 ENCOUNTER — Ambulatory Visit (INDEPENDENT_AMBULATORY_CARE_PROVIDER_SITE_OTHER): Payer: 59 | Admitting: Family Medicine

## 2022-06-08 DIAGNOSIS — Z0000 Encounter for general adult medical examination without abnormal findings: Secondary | ICD-10-CM

## 2022-06-08 NOTE — Progress Notes (Signed)
Debra [name] - 41 y.o. female MRN [id number]  Date of birth: 1981/07/18  Subjective No chief complaint on file.   HPI Debra [name] is a 41 year old female here today for annual exam.  She had labs completed prior to her visit which we reviewed in detail today.  Her cholesterol profile has improved significantly since last year.  She has been undergoing fertility treatment.  She was able to become pregnant however unfortunately lost her baby at 20 weeks due to cervical insufficiency.  She is planning on undergoing abdominal cerclage and try to get pregnant again.  She is working on losing weight and was recently prescribed Victoza.  She is tolerating this well so far.  She is exercising regularly.   She is a non-smoker.  Denies alcohol use at this time.  Review of Systems  Constitutional:  Negative for chills, fever, malaise/fatigue and weight loss.  HENT:  Negative for congestion, ear pain and sore throat.   Eyes:  Negative for blurred vision, double vision and pain.  Respiratory:  Negative for cough and shortness of breath.   Cardiovascular:  Negative for chest pain and palpitations.  Gastrointestinal:  Negative for abdominal pain, blood in stool, constipation, heartburn and nausea.  Genitourinary:  Negative for dysuria and urgency.  Musculoskeletal:  Negative for joint pain and myalgias.  Neurological:  Negative for dizziness and headaches.  Endo/Heme/Allergies:  Does not bruise/bleed easily.  Psychiatric/Behavioral:  Negative for depression. The patient is not nervous/anxious and does not have insomnia.       No Known Allergies  Past Medical History:  Diagnosis Date   Acne 11/02/2016   Allergic rhinitis 11/02/2016   Chronic low back pain without sciatica 01/03/2017   Cold sore 11/02/2016   Depressive disorder 11/25/2014   Dermatographia 11/02/2016   Esophageal reflux 12/02/2013   Incompetent cervix in pregnancy, antepartum, second trimester 04/01/2022    Microscopic hematuria 11/02/2016   Morbid obesity with BMI of 40.0-44.9, adult ([place]) 11/02/2016   Pregnancy resulting from in vitro fertilization in second trimester 03/31/2022   Premature rupture of membranes in second trimester 03/31/2022    Past Surgical History:  Procedure Laterality Date   DILATION AND CURETTAGE OF UTERUS N/A 04/01/2022   Procedure: DILATATION AND CURETTAGE;  Surgeon: Azucena Fallen, MD;  Location: [facility] ORS;  Service: Gynecology;  Laterality: N/A;    Social History   Socioeconomic History   Marital status: Married    Spouse name: Not on file   Number of children: Not on file   Years of education: Not on file   Highest education level: Not on file  Occupational History   Not on file  Tobacco Use   Smoking status: Never   Smokeless tobacco: Never  Vaping Use   Vaping Use: Never used  Substance and Sexual Activity   Alcohol use: Not Currently    Comment: 1-5/WK   Drug use: Never   Sexual activity: Yes    Partners: Male  Other Topics Concern   Not on file  Social History Narrative   Not on file   Social Determinants of Health   Financial Resource Strain: Not on file  Food Insecurity: Not on file  Transportation Needs: Not on file  Physical Activity: Not on file  Stress: Not on file  Social Connections: Not on file    Family History  Problem Relation Age of Onset   Breast cancer Mother    Allergic rhinitis Mother    Eczema Mother  Allergic rhinitis Father    Allergic rhinitis Brother     Health Maintenance  Topic Date Due   PAP SMEAR-Modifier  09/08/2022 (Originally 02/17/2020)   COVID-19 Vaccine (5 - Booster for Moderna series) 09/08/2022 (Originally 11/06/2021)   Hepatitis C Screening  06/09/2023 (Originally 04/24/2000)   INFLUENZA VACCINE  07/19/2022   TETANUS/TDAP  06/01/2030   HIV Screening  Completed   HPV VACCINES  Aged Out      ----------------------------------------------------------------------------------------------------------------------------------------------------------------------------------------------------------------- Physical Exam BP 138/82 (BP Location: Right Arm, Patient Position: Sitting, Cuff Size: Large)   Pulse 70   Ht '5\' 5"'$  (1.651 m)   Wt 269 lb (122 kg)   LMP  (LMP Unknown)   SpO2 98%   Breastfeeding No   BMI 44.76 kg/m   Physical Exam Constitutional:      General: She is not in acute distress. HENT:     Head: Normocephalic and atraumatic.     Right Ear: Tympanic membrane and ear canal normal.     Left Ear: Tympanic membrane and ear canal normal.     Nose: Nose normal.  Eyes:     General: No scleral icterus.    Conjunctiva/sclera: Conjunctivae normal.  Neck:     Thyroid: No thyromegaly.  Cardiovascular:     Rate and Rhythm: Normal rate and regular rhythm.     Heart sounds: Normal heart sounds.  Pulmonary:     Effort: Pulmonary effort is normal.     Breath sounds: Normal breath sounds.  Abdominal:     General: Bowel sounds are normal. There is no distension.     Palpations: Abdomen is soft.     Tenderness: There is no abdominal tenderness. There is no guarding.  Musculoskeletal:        General: Normal range of motion.     Cervical back: Normal range of motion and neck supple.  Lymphadenopathy:     Cervical: No cervical adenopathy.  Skin:    General: Skin is warm and dry.     Findings: No rash.  Neurological:     General: No focal deficit present.     Mental Status: She is alert and oriented to person, place, and time.     Cranial Nerves: No cranial nerve deficit.     Coordination: Coordination normal.  Psychiatric:        Mood and Affect: Mood normal.        Behavior: Behavior normal.      ------------------------------------------------------------------------------------------------------------------------------------------------------------------------------------------------------------------- Assessment and Plan  Well adult exam Well adult Recent labs reviewed with her. Screenings: Up-to-date Immunizations: Up-to-date Anticipatory guidance/risk factor reduction: Recommendations per AVS.   No orders of the defined types were placed in this encounter.   No follow-ups on file.    This visit occurred during the SARS-CoV-2 public health emergency.  Safety protocols were in place, including screening questions prior to the visit, additional usage of staff PPE, and extensive cleaning of exam room while observing appropriate contact time as indicated for disinfecting solutions.

## 2022-06-08 NOTE — Patient Instructions (Signed)
Preventive Care 40-40 Years Old, Female Preventive care refers to lifestyle choices and visits with your health care provider that can promote health and wellness. Preventive care visits are also called wellness exams. What can I expect for my preventive care visit? Counseling Your health care provider may ask you questions about your: Medical history, including: Past medical problems. Family medical history. Pregnancy history. Current health, including: Menstrual cycle. Method of birth control. Emotional well-being. Home life and relationship well-being. Sexual activity and sexual health. Lifestyle, including: Alcohol, nicotine or tobacco, and drug use. Access to firearms. Diet, exercise, and sleep habits. Work and work environment. Sunscreen use. Safety issues such as seatbelt and bike helmet use. Physical exam Your health care provider will check your: Height and weight. These may be used to calculate your BMI (body mass index). BMI is a measurement that tells if you are at a healthy weight. Waist circumference. This measures the distance around your waistline. This measurement also tells if you are at a healthy weight and may help predict your risk of certain diseases, such as type 2 diabetes and high blood pressure. Heart rate and blood pressure. Body temperature. Skin for abnormal spots. What immunizations do I need?  Vaccines are usually given at various ages, according to a schedule. Your health care provider will recommend vaccines for you based on your age, medical history, and lifestyle or other factors, such as travel or where you work. What tests do I need? Screening Your health care provider may recommend screening tests for certain conditions. This may include: Lipid and cholesterol levels. Diabetes screening. This is done by checking your blood sugar (glucose) after you have not eaten for a while (fasting). Pelvic exam and Pap test. Hepatitis B test. Hepatitis C  test. HIV (human immunodeficiency virus) test. STI (sexually transmitted infection) testing, if you are at risk. Lung cancer screening. Colorectal cancer screening. Mammogram. Talk with your health care provider about when you should start having regular mammograms. This may depend on whether you have a family history of breast cancer. BRCA-related cancer screening. This may be done if you have a family history of breast, ovarian, tubal, or peritoneal cancers. Bone density scan. This is done to screen for osteoporosis. Talk with your health care provider about your test results, treatment options, and if necessary, the need for more tests. Follow these instructions at home: Eating and drinking  Eat a diet that includes fresh fruits and vegetables, whole grains, lean protein, and low-fat dairy products. Take vitamin and mineral supplements as recommended by your health care provider. Do not drink alcohol if: Your health care provider tells you not to drink. You are pregnant, may be pregnant, or are planning to become pregnant. If you drink alcohol: Limit how much you have to 0-1 drink a day. Know how much alcohol is in your drink. In the [place] one drink equals one 12 oz bottle of beer (355 mL), one 5 oz glass of wine (148 mL), or one 1 oz glass of hard liquor (44 mL). Lifestyle Brush your teeth every morning and night with fluoride toothpaste. Floss one time each day. Exercise for at least 30 minutes 5 or more days each week. Do not use any products that contain nicotine or tobacco. These products include cigarettes, chewing tobacco, and vaping devices, such as e-cigarettes. If you need help quitting, ask your health care provider. Do not use drugs. If you are sexually active, practice safe sex. Use a condom or other form of protection to   prevent STIs. If you do not wish to become pregnant, use a form of birth control. If you plan to become pregnant, see your health care provider for a  prepregnancy visit. Take aspirin only as told by your health care provider. Make sure that you understand how much to take and what form to take. Work with your health care provider to find out whether it is safe and beneficial for you to take aspirin daily. Find healthy ways to manage stress, such as: Meditation, yoga, or listening to music. Journaling. Talking to a trusted person. Spending time with friends and family. Minimize exposure to UV radiation to reduce your risk of skin cancer. Safety Always wear your seat belt while driving or riding in a vehicle. Do not drive: If you have been drinking alcohol. Do not ride with someone who has been drinking. When you are tired or distracted. While texting. If you have been using any mind-altering substances or drugs. Wear a helmet and other protective equipment during sports activities. If you have firearms in your house, make sure you follow all gun safety procedures. Seek help if you have been physically or sexually abused. What's next? Visit your health care provider once a year for an annual wellness visit. Ask your health care provider how often you should have your eyes and teeth checked. Stay up to date on all vaccines. This information is not intended to replace advice given to you by your health care provider. Make sure you discuss any questions you have with your health care provider. Document Revised: 06/02/2021 Document Reviewed: 06/02/2021 Elsevier Patient Education  [place].

## 2022-06-08 NOTE — Assessment & Plan Note (Signed)
Well adult Recent labs reviewed with her. Screenings: Up-to-date Immunizations: Up-to-date Anticipatory guidance/risk factor reduction: Recommendations per AVS.

## 2022-07-01 ENCOUNTER — Other Ambulatory Visit: Payer: Self-pay | Admitting: Family Medicine

## 2022-07-19 ENCOUNTER — Encounter (HOSPITAL_BASED_OUTPATIENT_CLINIC_OR_DEPARTMENT_OTHER): Payer: Self-pay | Admitting: Obstetrics and Gynecology

## 2022-07-19 NOTE — Progress Notes (Signed)
Spoke w/ via phone for pre-op interview--- pt Lab needs dos----  t&s, urine preg             Lab results------ no COVID test -----patient states asymptomatic no test needed Arrive at ------- 1130 on 07-27-2022 NPO after MN NO Solid Food.  Clear liquids from MN until--- 1030 Med rec completed Medications to take morning of surgery ----- prevacid Diabetic medication ----- n/a Patient instructed no nail polish to be worn day of surgery Patient instructed to bring photo id and insurance card day of surgery Patient aware to have Driver (ride ) / caregiver  for 24 hours after surgery --- wife, edward Patient Special Instructions ----- n/a Pre-Op special Istructions ----- n/a Patient verbalized understanding of instructions that were given at this phone interview. Patient denies shortness of breath, chest pain, fever, cough at this phone interview.

## 2022-07-27 ENCOUNTER — Other Ambulatory Visit: Payer: Self-pay

## 2022-07-27 ENCOUNTER — Ambulatory Visit (HOSPITAL_BASED_OUTPATIENT_CLINIC_OR_DEPARTMENT_OTHER): Payer: 59 | Admitting: Anesthesiology

## 2022-07-27 ENCOUNTER — Ambulatory Visit (HOSPITAL_BASED_OUTPATIENT_CLINIC_OR_DEPARTMENT_OTHER)
Admission: RE | Admit: 2022-07-27 | Discharge: 2022-07-27 | Disposition: A | Discharge status: Home / Self Care | Payer: 59 | Attending: Obstetrics and Gynecology | Admitting: Obstetrics and Gynecology

## 2022-07-27 ENCOUNTER — Encounter (HOSPITAL_BASED_OUTPATIENT_CLINIC_OR_DEPARTMENT_OTHER): Payer: Self-pay | Admitting: Obstetrics and Gynecology

## 2022-07-27 ENCOUNTER — Encounter (HOSPITAL_BASED_OUTPATIENT_CLINIC_OR_DEPARTMENT_OTHER)
Admission: RE | Disposition: A | Discharge status: Home / Self Care | Payer: Self-pay | Source: Home / Self Care | Attending: Obstetrics and Gynecology

## 2022-07-27 DIAGNOSIS — G4733 Obstructive sleep apnea (adult) (pediatric): Secondary | ICD-10-CM

## 2022-07-27 DIAGNOSIS — Z8759 Personal history of other complications of pregnancy, childbirth and the puerperium: Secondary | ICD-10-CM | POA: Insufficient documentation

## 2022-07-27 DIAGNOSIS — N736 Female pelvic peritoneal adhesions (postinfective): Secondary | ICD-10-CM | POA: Diagnosis not present

## 2022-07-27 DIAGNOSIS — Z9989 Dependence on other enabling machines and devices: Secondary | ICD-10-CM

## 2022-07-27 DIAGNOSIS — G4730 Sleep apnea, unspecified: Secondary | ICD-10-CM | POA: Diagnosis not present

## 2022-07-27 DIAGNOSIS — N883 Incompetence of cervix uteri: Secondary | ICD-10-CM | POA: Diagnosis present

## 2022-07-27 DIAGNOSIS — Z6841 Body Mass Index (BMI) 40.0 and over, adult: Secondary | ICD-10-CM | POA: Insufficient documentation

## 2022-07-27 DIAGNOSIS — K219 Gastro-esophageal reflux disease without esophagitis: Secondary | ICD-10-CM | POA: Insufficient documentation

## 2022-07-27 DIAGNOSIS — Z01818 Encounter for other preprocedural examination: Secondary | ICD-10-CM

## 2022-07-27 HISTORY — DX: Obstructive sleep apnea (adult) (pediatric): G47.33

## 2022-07-27 HISTORY — DX: Gastro-esophageal reflux disease without esophagitis: K21.9

## 2022-07-27 HISTORY — DX: Incompetence of cervix uteri: N88.3

## 2022-07-27 HISTORY — DX: Unspecified hearing loss, bilateral: H91.93

## 2022-07-27 HISTORY — DX: Female infertility, unspecified: N97.9

## 2022-07-27 HISTORY — DX: Iron deficiency anemia, unspecified: D50.9

## 2022-07-27 HISTORY — DX: Polyp of corpus uteri: N84.0

## 2022-07-27 HISTORY — PX: CERCLAGE LAPAROSCOPIC ABDOMINAL: SHX5769

## 2022-07-27 HISTORY — PX: HYSTEROSCOPY: SHX211

## 2022-07-27 HISTORY — DX: Depression, unspecified: F32.A

## 2022-07-27 LAB — TYPE AND SCREEN
ABO/RH(D): O POS
Antibody Screen: NEGATIVE

## 2022-07-27 LAB — POCT PREGNANCY, URINE: Preg Test, Ur: NEGATIVE

## 2022-07-27 SURGERY — CERCLAGE, CERVIX, LAPAROSCOPIC
Anesthesia: General | Site: Uterus

## 2022-07-27 MED ORDER — MEPERIDINE HCL 25 MG/ML IJ SOLN: 6.2500 mg | INTRAMUSCULAR | Status: DC | PRN

## 2022-07-27 MED ORDER — DEXMEDETOMIDINE (PRECEDEX) IN NS 20 MCG/5ML (4 MCG/ML) IV SYRINGE
PREFILLED_SYRINGE | INTRAVENOUS | Status: DC | PRN
  Administered 2022-07-27 (×2): 8 ug via INTRAVENOUS

## 2022-07-27 MED ORDER — FENTANYL CITRATE (PF) 250 MCG/5ML IJ SOLN
INTRAMUSCULAR | Status: AC
  Filled 2022-07-27: qty 5

## 2022-07-27 MED ORDER — POVIDONE-IODINE 10 % EX SWAB: 2.0000 "application " | Freq: Once | CUTANEOUS | Status: DC

## 2022-07-27 MED ORDER — AMISULPRIDE (ANTIEMETIC) 5 MG/2ML IV SOLN
10.0000 mg | Freq: Once | INTRAVENOUS | Status: AC | PRN
  Administered 2022-07-27: 10 mg via INTRAVENOUS

## 2022-07-27 MED ORDER — LIDOCAINE HCL (CARDIAC) PF 100 MG/5ML IV SOSY
PREFILLED_SYRINGE | INTRAVENOUS | Status: DC | PRN
  Administered 2022-07-27: 100 mg via INTRAVENOUS

## 2022-07-27 MED ORDER — FENTANYL CITRATE (PF) 100 MCG/2ML IJ SOLN
INTRAMUSCULAR | Status: DC | PRN
  Administered 2022-07-27 (×3): 50 ug via INTRAVENOUS
  Administered 2022-07-27: 100 ug via INTRAVENOUS

## 2022-07-27 MED ORDER — OXYCODONE HCL 5 MG/5ML PO SOLN: 5.0000 mg | Freq: Once | ORAL | Status: DC | PRN

## 2022-07-27 MED ORDER — ROCURONIUM BROMIDE 100 MG/10ML IV SOLN
INTRAVENOUS | Status: DC | PRN
  Administered 2022-07-27: 100 mg via INTRAVENOUS

## 2022-07-27 MED ORDER — CEFAZOLIN SODIUM-DEXTROSE 2-4 GM/100ML-% IV SOLN
INTRAVENOUS | Status: AC
  Filled 2022-07-27: qty 100

## 2022-07-27 MED ORDER — ONDANSETRON HCL 4 MG/2ML IJ SOLN
INTRAMUSCULAR | Status: AC
  Filled 2022-07-27: qty 2

## 2022-07-27 MED ORDER — VASOPRESSIN 20 UNIT/ML IV SOLN
INTRAVENOUS | Status: DC | PRN
  Administered 2022-07-27: 7 mL via INTRAMUSCULAR

## 2022-07-27 MED ORDER — ONDANSETRON HCL 4 MG/2ML IJ SOLN
INTRAMUSCULAR | Status: DC | PRN
  Administered 2022-07-27: 4 mg via INTRAVENOUS

## 2022-07-27 MED ORDER — SUGAMMADEX SODIUM 200 MG/2ML IV SOLN
INTRAVENOUS | Status: DC | PRN
  Administered 2022-07-27: 250 mg via INTRAVENOUS

## 2022-07-27 MED ORDER — POVIDONE-IODINE 10 % EX SWAB: 2.0000 | Freq: Once | CUTANEOUS | Status: DC

## 2022-07-27 MED ORDER — DEXAMETHASONE SODIUM PHOSPHATE 4 MG/ML IJ SOLN
INTRAMUSCULAR | Status: DC | PRN
  Administered 2022-07-27: 10 mg via INTRAVENOUS

## 2022-07-27 MED ORDER — WHITE PETROLATUM EX OINT
TOPICAL_OINTMENT | CUTANEOUS | Status: AC
  Filled 2022-07-27: qty 5

## 2022-07-27 MED ORDER — AMISULPRIDE (ANTIEMETIC) 5 MG/2ML IV SOLN
INTRAVENOUS | Status: AC
  Filled 2022-07-27: qty 4

## 2022-07-27 MED ORDER — HYDROMORPHONE HCL 1 MG/ML IJ SOLN
0.2500 mg | INTRAMUSCULAR | Status: DC | PRN
  Administered 2022-07-27: 0.25 mg via INTRAVENOUS
  Administered 2022-07-27: 0.5 mg via INTRAVENOUS

## 2022-07-27 MED ORDER — HYDROMORPHONE HCL 1 MG/ML IJ SOLN
INTRAMUSCULAR | Status: AC
  Filled 2022-07-27: qty 1

## 2022-07-27 MED ORDER — BUPIVACAINE HCL 0.25 % IJ SOLN
INTRAMUSCULAR | Status: DC | PRN
  Administered 2022-07-27: 10 mL

## 2022-07-27 MED ORDER — MIDAZOLAM HCL 2 MG/2ML IJ SOLN
INTRAMUSCULAR | Status: AC
  Filled 2022-07-27: qty 2

## 2022-07-27 MED ORDER — LIDOCAINE HCL (PF) 2 % IJ SOLN
INTRAMUSCULAR | Status: AC
  Filled 2022-07-27: qty 5

## 2022-07-27 MED ORDER — SODIUM CHLORIDE 0.9 % IR SOLN
Status: DC | PRN
  Administered 2022-07-27: 1000 mL

## 2022-07-27 MED ORDER — DEXMEDETOMIDINE HCL IN NACL 80 MCG/20ML IV SOLN
INTRAVENOUS | Status: AC
  Filled 2022-07-27: qty 20

## 2022-07-27 MED ORDER — OXYCODONE HCL 5 MG PO TABS: 5.0000 mg | ORAL_TABLET | Freq: Once | ORAL | Status: DC | PRN

## 2022-07-27 MED ORDER — OXYCODONE-ACETAMINOPHEN 7.5-325 MG PO TABS: 1.0000 | ORAL_TABLET | ORAL | 0 refills | Status: DC | PRN

## 2022-07-27 MED ORDER — MIDAZOLAM HCL 5 MG/5ML IJ SOLN
INTRAMUSCULAR | Status: DC | PRN
  Administered 2022-07-27: 2 mg via INTRAVENOUS

## 2022-07-27 MED ORDER — KETOROLAC TROMETHAMINE 30 MG/ML IJ SOLN
INTRAMUSCULAR | Status: DC | PRN
  Administered 2022-07-27: 30 mg via INTRAVENOUS

## 2022-07-27 MED ORDER — ONDANSETRON HCL 4 MG PO TABS: 4.0000 mg | ORAL_TABLET | Freq: Every day | ORAL | 1 refills | Status: DC | PRN

## 2022-07-27 MED ORDER — PROPOFOL 10 MG/ML IV BOLUS
INTRAVENOUS | Status: DC | PRN
  Administered 2022-07-27: 150 mg via INTRAVENOUS

## 2022-07-27 MED ORDER — ROCURONIUM BROMIDE 10 MG/ML (PF) SYRINGE
PREFILLED_SYRINGE | INTRAVENOUS | Status: AC
  Filled 2022-07-27: qty 10

## 2022-07-27 MED ORDER — LACTATED RINGERS IV SOLN
INTRAVENOUS | Status: DC
Start: 2022-07-27 — End: 2022-07-27

## 2022-07-27 MED ORDER — CEFAZOLIN SODIUM-DEXTROSE 2-3 GM-%(50ML) IV SOLR
INTRAVENOUS | Status: DC | PRN
  Administered 2022-07-27: 2 g via INTRAVENOUS

## 2022-07-27 MED ORDER — PROPOFOL 10 MG/ML IV BOLUS
INTRAVENOUS | Status: AC
  Filled 2022-07-27: qty 20

## 2022-07-27 MED ORDER — DEXAMETHASONE SODIUM PHOSPHATE 10 MG/ML IJ SOLN
INTRAMUSCULAR | Status: AC
  Filled 2022-07-27: qty 1

## 2022-07-27 MED ORDER — AZITHROMYCIN 1 G PO PACK: 1.0000 g | PACK | Freq: Once | ORAL | 0 refills | Status: AC

## 2022-07-27 MED ORDER — PROMETHAZINE HCL 25 MG/ML IJ SOLN: 6.2500 mg | INTRAMUSCULAR | Status: DC | PRN

## 2022-07-27 SURGICAL SUPPLY — 67 items
ADH SKN CLS APL DERMABOND .7 (GAUZE/BANDAGES/DRESSINGS) ×2
APL SRG 38 LTWT LNG FL B (MISCELLANEOUS)
APPLICATOR ARISTA FLEXITIP XL (MISCELLANEOUS) IMPLANT
BIPOLAR CUTTING LOOP 21FR (ELECTRODE)
BRR ADH 6X5 SEPRAFILM 1 SHT (MISCELLANEOUS)
CABLE HIGH FREQUENCY MONO STRZ (ELECTRODE) IMPLANT
CANNULA CURETTE W/SYR 6 (CANNULA) IMPLANT
CANNULA CURETTE W/SYR 7 (CANNULA) IMPLANT
CATH NOVY CORNUAL CURVED 5.0 (CATHETERS) IMPLANT
CATH ROBINSON RED A/P 16FR (CATHETERS) ×3 IMPLANT
COVER MAYO STAND STRL (DRAPES) IMPLANT
CURETTE PIPELLE ENDOMTRL SUCTN (MISCELLANEOUS) IMPLANT
DERMABOND ×1 IMPLANT
DERMABOND ADVANCED (GAUZE/BANDAGES/DRESSINGS) ×1
DERMABOND ADVANCED .7 DNX12 (GAUZE/BANDAGES/DRESSINGS) ×2 IMPLANT
DEVICE TROCAR PUNCTURE CLOSURE (ENDOMECHANICALS) ×6 IMPLANT
DILATOR CANAL MILEX (MISCELLANEOUS) IMPLANT
DRAPE C-ARM 42X120 X-RAY (DRAPES) IMPLANT
DRSG TELFA 3X8 NADH (GAUZE/BANDAGES/DRESSINGS) ×3 IMPLANT
DURAPREP 26ML APPLICATOR (WOUND CARE) ×3 IMPLANT
ELECT BIPOLAR KNIFE NDL PTD 7M (ELECTRODE) IMPLANT
ELECT REM PT RETURN 9FT ADLT (ELECTROSURGICAL) ×3
ELECTRODE REM PT RTRN 9FT ADLT (ELECTROSURGICAL) ×2 IMPLANT
GAUZE 4X4 16PLY ~~LOC~~+RFID DBL (SPONGE) ×6 IMPLANT
GLOVE BIO SURGEON STRL SZ8 (GLOVE) ×3 IMPLANT
GOWN STRL REUS W/TWL LRG LVL3 (GOWN DISPOSABLE) ×6 IMPLANT
GOWN STRL REUS W/TWL XL LVL3 (GOWN DISPOSABLE) ×6 IMPLANT
HEMOSTAT ARISTA ABSORB 3G PWDR (HEMOSTASIS) IMPLANT
IV NS 1000ML (IV SOLUTION) ×3
IV NS 1000ML BAXH (IV SOLUTION) IMPLANT
IV NS IRRIG 3000ML ARTHROMATIC (IV SOLUTION) ×4 IMPLANT
KIT PROCEDURE FLUENT (KITS) ×3 IMPLANT
KIT TURNOVER CYSTO (KITS) ×3 IMPLANT
LOOP CUTTING BIPOLAR 21FR (ELECTRODE) IMPLANT
MANIPULATOR UTERINE 4.5 ZUMI (MISCELLANEOUS) IMPLANT
NDL INSUFFLATION 14GA 120MM (NEEDLE) ×2 IMPLANT
NEEDLE INSUFFLATION 14GA 120MM (NEEDLE) ×3 IMPLANT
NS IRRIG 500ML POUR BTL (IV SOLUTION) ×1 IMPLANT
PACK LAPAROSCOPY BASIN (CUSTOM PROCEDURE TRAY) ×3 IMPLANT
PACK TRENDGUARD 450 HYBRID PRO (MISCELLANEOUS) IMPLANT
PACK VAGINAL MINOR WOMEN LF (CUSTOM PROCEDURE TRAY) ×3 IMPLANT
PAD DRESSING TELFA 3X8 NADH (GAUZE/BANDAGES/DRESSINGS) ×2 IMPLANT
PAD OB MATERNITY 4.3X12.25 (PERSONAL CARE ITEMS) ×3 IMPLANT
PIPELLE ENDOMETRIAL SUCTION CU (MISCELLANEOUS) ×3
RETRACTOR LAPSCP 12X46 CVD (ENDOMECHANICALS) ×2 IMPLANT
RTRCTR LAPSCP 12X46 CVD (ENDOMECHANICALS) ×3
SEAL ROD LENS SCOPE MYOSURE (ABLATOR) ×3 IMPLANT
SEPRAFILM MEMBRANE 5X6 (MISCELLANEOUS) IMPLANT
SET IRRIG Y TYPE TUR BLADDER L (SET/KITS/TRAYS/PACK) IMPLANT
SET SUCTION IRRIG HYDROSURG (IRRIGATION / IRRIGATOR) ×3 IMPLANT
SET TUBE SMOKE EVAC HIGH FLOW (TUBING) ×3 IMPLANT
SHEARS HARMONIC ACE PLUS 36CM (ENDOMECHANICALS) ×3 IMPLANT
STENT BALLN UTERINE 3CM 6FR (STENTS) IMPLANT
STENT BALLN UTERINE 4CM 6FR (STENTS) IMPLANT
SUT MERSILENE 5MM BP 1 12 (SUTURE) ×3 IMPLANT
SUT MNCRL AB 4-0 PS2 18 (SUTURE) ×3 IMPLANT
SUT SILK 2 0 30  PSL (SUTURE) ×1
SUT SILK 2 0 30 PSL (SUTURE) ×2 IMPLANT
SUT VIC AB 2-0 UR6 27 (SUTURE) ×3 IMPLANT
SYRINGE 3CC/18X1.5 ECLIPSE (MISCELLANEOUS) ×3 IMPLANT
TOWEL OR 17X26 10 PK STRL BLUE (TOWEL DISPOSABLE) ×6 IMPLANT
TRAY FOLEY W/BAG SLVR 14FR LF (SET/KITS/TRAYS/PACK) ×3 IMPLANT
TRENDGUARD 450 HYBRID PRO PACK (MISCELLANEOUS)
TROCAR Z-THREAD BLADED 11X100M (TROCAR) IMPLANT
TROCAR Z-THREAD BLADED 5X100MM (TROCAR) ×6 IMPLANT
TROCAR Z-THREAD FIOS 12X100MM (TROCAR) ×3 IMPLANT
WARMER LAPAROSCOPE (MISCELLANEOUS) ×3 IMPLANT

## 2022-07-27 NOTE — Op Note (Signed)
OPERATIVE NOTE   Preoperative diagnosis: History of cervical insufficiency, probable recurrent intrauterine adhesions   Postoperative diagnosis: History of cervical insufficiency, pelvic adhesions   Procedure: Laparoscopy, transabdominal cervical isthmic cerclage, Harmonic Ace lysis of adhesions, hysteroscopy, endometrial biopsy   Anesthesia: Gen. Endotracheal   Surgeon:Punam Broussard Kerin Perna, MD   Complications: None   Specimens: Endometrial biopsy to pathology   Estimated blood loss: <20 cc   Findings: On exam under anesthesia the cervix was closed. On laparoscopy there were dense adhesions between the right ovary and intestinal epiploica.  The liver edges and gallbladder were normal. The uterus and both tubes appeared normal, ovaries appeared normal. On hysteroscopy, endocervical canal was normal. Internal cervical os appeared flaccid. There were lower uterine segment filmy adhesions in the uterine cavity.  Both tubal ostia were seen.   Description of the procedure: The patient was placed in dorsal supine position and general endotracheal anesthesia was given. She was then placed in lithotomy position and the abdomen was prepped and draped inside manner. A Foley catheter was inserted into the bladder.  A Slimline 30 hysteroscope with an attached video camera was inserted into the endocervical canal. Distention medium what saline. The distention method was gravity.  Above findings were obtained. Pipelle device was used to sample the endometrium and this was sent for immunohistochemistry. The uterine cavity sounded to 9 cm. A uterine sound was placed in the uterine cavity and used as a spacer during the cerclage tying. The surgeon was regowned and regloved and operative field was created at the abdomen.  An intraumbilical 5 mm vertical skin incision was made after preemptive anesthesia of all incisions with 0.25% bupivacaine. A Verress needle was inserted. A pneumoperitoneum was created with  carbon dioxide. A 5 mm trocar and then a corresponding laparoscope with 30 angle was inserted and video laparoscopy was started.  Under direct visualization 2 more 5 mm lower quadrant incisions were made and corresponding trochars were placed.  Above findings were noted. A #5 Mersilene tape was prepared by cutting the needles off just at the swaged and and creating a loop of 2-0 silk at each end to allow carrying the end of the Mersilene tape through the tissue during the cerclage. This Mersilene tape was then dropped into the posterior cul-de-sac. The patient was placed in Trendelenburg position and the uterus was gently pressed posteriorly and superiorly while the bladder flap was created with the harmonic Ace. After blunt and sharp dissection the cervico-isthmic junction could be well visualized.  Next a stab wound incision was made 1 cm from the midline immediately suprapubically and an Endo Close ligature carrier was passed through the abdominal wall and then through the right side of the cervico-isthmic junction. At this point of passing the tip of the Endo Close through the parametrium the uterus was anteverted and lifted anteriorly, allowing visualization of the posterior lower uterine segment. The tip of the Endo Close device was brought out posteriorly just above the medial insertion point of the right uterosacral ligament onto the uterus. one end of the Mersilene tape was grasped with the Endo Close device and brought out anteriorly. The same steps were repeated with a new Endo Close device on the left anterior aspect of the cervico-isthmic junction, immediately medial to the uterine blood vessels, and the other end of the Mersilene tape was grasped with the ligature carrier device and brought out anteriorly. The cerclage suture was tied with a surgeon's knot followed by 4 additional knots. The ends of the  Mersilene tape was trimmed off and removed from the pelvis. Good hemostasis was insured. The  pelvis was copiously irrigated and aspirated. The gas was allowed to escape. The skin incisions were approximated with Dermabond. The tightness of the cervical canal following the cerclage suture was tested with the tip of a uterine sound which could be inserted into the uterine cavity with some resistance. The patient tolerated the procedure well and was transferred to recovery room in satisfactory condition.   Governor Specking, MD

## 2022-07-27 NOTE — H&P (Signed)
Debra [name] is a 41 y.o. female , originally referred to me by Dr. Benjie Karvonen, for history of cervical insufficiency and infertility.  She went through IVF and has cryopreserved embryos.  She desires transabdominal cerclage instead of transvaginal cerclage for cervical insufficiency history.  Patient would like to preserve her childbearing potential.   Pertinent Gynecological History: Menses: flow is normal Contraception: none DES exposure: denies Blood transfusions: none Sexually transmitted diseases: no past history  Last mammogram: normal Last pap: normal   Menstrual History: Menarche age: 89 No LMP recorded.    Past Medical History:  Diagnosis Date   Acne    Allergic rhinitis    Cold sore 11/02/2016   Depression    GERD (gastroesophageal reflux disease)    Hearing loss of both ears    per pt right > left,  does not wear hearing aids   IDA (iron deficiency anemia)    Incompetent cervix    Infertility, female    Mild obstructive sleep apnea    per pt had sleep study was very mild osa,  did not meet critiria for insurance to pay but pt paid out of pocket since it helps due to dust mite allergy   Prior pregnancy with incompetent cervix in second trimester, antepartum 04/01/2022   03-31-2022 pre-mature ruptured membrane [redacted]wk gestation, nonviable   Uterine polyp                     Past Surgical History:  Procedure Laterality Date   DILATION AND CURETTAGE OF UTERUS N/A 04/01/2022   Procedure: DILATATION AND CURETTAGE;  Surgeon: Azucena Fallen, MD;  Location: [facility];  Service: Gynecology;  Laterality: N/A;             Family History  Problem Relation Age of Onset   Breast cancer Mother    Allergic rhinitis Mother    Eczema Mother    Allergic rhinitis Father    Allergic rhinitis Brother    No hereditary disease.  No cancer of breast, ovary, uterus.  Social History   Socioeconomic History   Marital status: Married    Spouse name: Not on file   Number  of children: Not on file   Years of education: Not on file   Highest education level: Not on file  Occupational History   Not on file  Tobacco Use   Smoking status: Never   Smokeless tobacco: Never  Vaping Use   Vaping Use: Never used  Substance and Sexual Activity   Alcohol use: Yes    Comment: occasional   Drug use: Never   Sexual activity: Yes    Partners: Male    Birth control/protection: Condom  Other Topics Concern   Not on file  Social History Narrative   Not on file   Social Determinants of Health   Financial Resource Strain: Not on file  Food Insecurity: Not on file  Transportation Needs: Not on file  Physical Activity: Not on file  Stress: Not on file  Social Connections: Not on file  Intimate Partner Violence: Not on file    No Known Allergies  No current facility-administered medications on file prior to encounter.   Current Outpatient Medications on File Prior to Encounter  Medication Sig Dispense Refill   Biotin 10000 MCG TABS Take 1 tablet by mouth daily.     calcium citrate (CALCITRATE - DOSED IN MG ELEMENTAL CALCIUM) 950 (200 Ca) MG tablet Take 4,200 mg of elemental calcium by mouth daily.  Cholecalciferol (VITAMIN D3) 125 MCG (5000 UT) CAPS Take 1 capsule by mouth daily.     Cyanocobalamin (B-12) 5000 MCG CAPS Take 1 capsule by mouth daily.     Ergocalciferol (VITAMIN D2 PO) Take 1,000 Int'l Units/day by mouth daily.     Ferrous Sulfate (IRON) 325 (65 Fe) MG TABS Take 1 tablet by mouth daily.     folic acid (FOLVITE) 1 MG tablet Take 1 mg by mouth daily.     lansoprazole (PREVACID) 15 MG capsule Take 15 mg by mouth daily at 12 noon.     loratadine (CLARITIN) 10 MG tablet Take 10 mg by mouth daily.     niacin (VITAMIN B3) 500 MG tablet Take 500 mg by mouth at bedtime.     Omega-3 Fatty Acids (OMEGA-3 FISH OIL PO) Take 5,000 mg by mouth daily.     OVER THE COUNTER MEDICATION Take 1 tablet by mouth daily. Lions' mane supplement (mushroom extract)   2000 mg     Prenatal Vit-Fe Fumarate-FA (PRENATAL MULTIVITAMIN) TABS tablet Take 1 tablet by mouth daily at 12 noon.     PSYLLIUM FIBER PO Take 3,000 mg by mouth daily.     Riboflavin (B-2-400 PO) Take 1 tablet by mouth daily.     Turmeric 1053 MG TABS Take 1 tablet by mouth daily.     valACYclovir (VALTREX) 1000 MG tablet TAKE 1 TABLET BY MOUTH  DAILY (Patient taking differently: Take 1,000 mg by mouth as needed.) 90 tablet 3   Vitamin A 3 MG (10000 UT) TABS Take 1 tablet by mouth daily.       Review of Systems  Constitutional: Negative.   HENT: Negative.   Eyes: Negative.   Respiratory: Negative.   Cardiovascular: Negative.   Gastrointestinal: Negative.   Genitourinary: Negative.   Musculoskeletal: Negative.   Skin: Negative.   Neurological: Negative.   Endo/Heme/Allergies: Negative.   Psychiatric/Behavioral: Negative.    Physical Exam  BP (!) 166/104   Pulse 71   Temp 98 F (36.7 C)   Resp 18   Ht '5\' 5"'$  (1.651 m)   Wt 119.1 kg   LMP 07/04/2022 (Exact Date)   SpO2 97%   BMI 43.70 kg/m  Constitutional: She is oriented to person, place, and time. She appears well-developed and well-nourished.  HENT:  Head: Normocephalic and atraumatic.  Nose: Nose normal.  Mouth/Throat: Oropharynx is clear and moist. No oropharyngeal exudate.  Eyes: Conjunctivae normal and EOM are normal. Pupils are equal, round, and reactive to light. No scleral icterus.  Neck: Normal range of motion. Neck supple. No tracheal deviation present. No thyromegaly present.  Cardiovascular: Normal rate.   Respiratory: Effort normal and breath sounds normal.  GI: Soft. Bowel sounds are normal. She exhibits no distension and no mass. There is no tenderness.  Lymphadenopathy:    She has no cervical adenopathy.  Neurological: She is alert and oriented to person, place, and time. She has normal reflexes.  Skin: Skin is warm.  Psychiatric: She has a normal mood and affect. Her behavior is normal. Judgment and  thought content normal.    Assessment/Plan:  History of cervical insufficiency, desiring transabdominal cerclage Ultrasound evidence of endometrial polyp Preoperative for hysteroscopy, possible polypectomy, laparoscopy, transabdominal cervical isthmic cerclage. Benefits and risks of the proposed procedure were discussed with the patient and her family member again.  Bowel prep instructions were given.  All of patient's questions were answered.  She verbalized understanding.  She knows that she will need a cesarean  delivery for future pregnancies.  Governor Specking, MD

## 2022-07-27 NOTE — Anesthesia Procedure Notes (Signed)
Procedure Name: Intubation Date/Time: 07/27/2022 2:16 PM  Performed by: Justice Rocher, CRNAPre-anesthesia Checklist: Patient identified, Emergency Drugs available, Suction available, Patient being monitored and Timeout performed Patient Re-evaluated:Patient Re-evaluated prior to induction Oxygen Delivery Method: Circle system utilized Preoxygenation: Pre-oxygenation with 100% oxygen Induction Type: IV induction Ventilation: Mask ventilation without difficulty Laryngoscope Size: Mac and 3 Grade View: Grade II Tube type: Oral Tube size: 7.0 mm Number of attempts: 1 Airway Equipment and Method: Stylet and Oral airway Placement Confirmation: ETT inserted through vocal cords under direct vision, positive ETCO2, breath sounds checked- equal and bilateral and CO2 detector Secured at: 22 cm Tube secured with: Tape Dental Injury: Teeth and Oropharynx as per pre-operative assessment

## 2022-07-27 NOTE — Discharge Instructions (Addendum)
DISCHARGE INSTRUCTIONS: Laparoscopy  The following instructions have been prepared to help you care for yourself upon your return home today.  Wound care:  Do not get the incision wet for the first 24 hours. The incision should be kept clean and dry.  The Band-Aids or dressings may be removed the day after surgery.  Should the incision become sore, red, and swollen after the first week, check with your doctor.  Personal hygiene:  Shower the day after your procedure.  Activity and limitations:  Do NOT drive or operate any equipment today.  Do NOT lift anything more than 15 pounds for 2-3 weeks after surgery.  Do NOT rest in bed all day.  Walking is encouraged. Walk each day, starting slowly with 5-minute walks 3 or 4 times a day. Slowly increase the length of your walks.  Walk up and down stairs slowly.  Do NOT do strenuous activities, such as golfing, playing tennis, bowling, running, biking, weight lifting, gardening, mowing, or vacuuming for 2-4 weeks. Ask your doctor when it is okay to start.  Diet: Eat a light meal as desired this evening. You may resume your usual diet tomorrow.  Return to work: This is dependent on the type of work you do. For the most part you can return to a desk job within a week of surgery. If you are more active at work, please discuss this with your doctor.  What to expect after your surgery: You may have a slight burning sensation when you urinate on the first day. You may have a very small amount of blood in the urine. Expect to have a small amount of vaginal discharge/light bleeding for 1-2 weeks. It is not unusual to have abdominal soreness and bruising for up to 2 weeks. You may be tired and need more rest for about 1 week. You may experience shoulder pain for 24-72 hours. Lying flat in bed may relieve it.  Call your doctor for any of the following:  Develop a fever of 100.4 or greater  Inability to urinate 6 hours after discharge from hospital  Severe  pain not relieved by pain medications  Persistent of heavy bleeding at incision site  Redness or swelling around incision site after a week  Increasing nausea or vomiting  Do not take any nonsteroidal anti inflammatories until after 9:20 pm today if needed.    Post Anesthesia Home Care Instructions  Activity: Get plenty of rest for the remainder of the day. A responsible individual must stay with you for 24 hours following the procedure.  For the next 24 hours, DO NOT: -Drive a car -Paediatric nurse -Drink alcoholic beverages -Take any medication unless instructed by your physician -Make any legal decisions or sign important papers.  Meals: Start with liquid foods such as gelatin or soup. Progress to regular foods as tolerated. Avoid greasy, spicy, heavy foods. If nausea and/or vomiting occur, drink only clear liquids until the nausea and/or vomiting subsides. Call your physician if vomiting continues.  Special Instructions/Symptoms: Your throat may feel dry or sore from the anesthesia or the breathing tube placed in your throat during surgery. If this causes discomfort, gargle with warm salt water. The discomfort should disappear within 24 hours.  If you had a scopolamine patch placed behind your ear for the management of post- operative nausea and/or vomiting:  1. The medication in the patch is effective for 72 hours, after which it should be removed.  Wrap patch in a tissue and discard in the trash.  Wash hands thoroughly with soap and water. 2. You may remove the patch earlier than 72 hours if you experience unpleasant side effects which may include dry mouth, dizziness or visual disturbances. 3. Avoid touching the patch. Wash your hands with soap and water after contact with the patch.

## 2022-07-27 NOTE — Transfer of Care (Signed)
Immediate Anesthesia Transfer of Care Note  Patient: Debra [name]  Procedure(s) Performed: Procedure(s) (LRB): CERCLAGE LAPAROSCOPIC CERVICO- ABDOMINAL AND LYSIS OF ADHESIONS (N/A) HYSTEROSCOPY WITH ENDOMETRIAL BIOPSY (N/A)  Patient Location: PACU  Anesthesia Type: General  Level of Consciousness: awake, oriented, sedated and patient cooperative  Airway & Oxygen Therapy: Patient Spontanous Breathing and Patient connected to face mask oxygen  Post-op Assessment: Report given to PACU RN and Post -op Vital signs reviewed and stable  Post vital signs: Reviewed and stable  Complications: No apparent anesthesia complications Last Vitals:  Vitals Value Taken Time  BP 132/92 07/27/22 1536  Temp 36.5 C 07/27/22 1536  Pulse 75 07/27/22 1541  Resp 9 07/27/22 1541  SpO2 95 % 07/27/22 1541  Vitals shown include unvalidated device data.  Last Pain: There were no vitals filed for this visit.       Complications: No notable events documented.

## 2022-07-27 NOTE — Anesthesia Preprocedure Evaluation (Signed)
Anesthesia Evaluation  Patient identified by MRN, date of birth, ID band Patient awake    Reviewed: Allergy & Precautions, NPO status , Patient's Chart, lab work & pertinent test results  Airway Mallampati: III  TM Distance: >3 FB Neck ROM: Full    Dental no notable dental hx.    Pulmonary sleep apnea and Continuous Positive Airway Pressure Ventilation ,    Pulmonary exam normal        Cardiovascular negative cardio ROS   Rhythm:Regular Rate:Normal     Neuro/Psych Depression negative neurological ROS     GI/Hepatic Neg liver ROS, GERD  Medicated,  Endo/Other  Morbid obesity  Renal/GU negative Renal ROS  negative genitourinary   Musculoskeletal negative musculoskeletal ROS (+)   Abdominal (+) + obese,   Peds  Hematology negative hematology ROS (+)   Anesthesia Other Findings   Reproductive/Obstetrics D/C for retained POC. Fetal demise 20w                             Anesthesia Physical  Anesthesia Plan  ASA: 3  Anesthesia Plan: General   Post-op Pain Management: Minimal or no pain anticipated   Induction: Intravenous  PONV Risk Score and Plan: 3 and Ondansetron, Dexamethasone, Midazolam and Treatment may vary due to age or medical condition  Airway Management Planned: LMA  Additional Equipment: None  Intra-op Plan:   Post-operative Plan: Extubation in OR  Informed Consent: I have reviewed the patients History and Physical, chart, labs and discussed the procedure including the risks, benefits and alternatives for the proposed anesthesia with the patient or authorized representative who has indicated his/her understanding and acceptance.     Dental advisory given  Plan Discussed with: CRNA  Anesthesia Plan Comments: (Lab Results      Component                Value               Date                      WBC                      15.6 (H)            03/31/2022                 HGB                      12.5                03/31/2022                HCT                      39.5                03/31/2022                MCV                      81.1                03/31/2022                PLT  239                 03/31/2022           Lab Results      Component                Value               Date                      NA                       137                 06/03/2021                K                        4.4                 06/03/2021                CO2                      23                  06/03/2021                GLUCOSE                  79                  06/03/2021                BUN                      15                  06/03/2021                CREATININE               0.76                06/03/2021                CALCIUM                  9.7                 06/03/2021                GFRNONAA                 99                  06/03/2021          )        Anesthesia Quick Evaluation

## 2022-07-27 NOTE — Anesthesia Postprocedure Evaluation (Signed)
Anesthesia Post Note  Patient: Debra [name]  Procedure(s) Performed: CERCLAGE LAPAROSCOPIC CERVICO- ABDOMINAL AND LYSIS OF ADHESIONS (Pelvis) HYSTEROSCOPY WITH ENDOMETRIAL BIOPSY (Uterus)     Patient location during evaluation: PACU Anesthesia Type: General Level of consciousness: awake and alert Pain management: pain level controlled Vital Signs Assessment: post-procedure vital signs reviewed and stable Respiratory status: spontaneous breathing, nonlabored ventilation, respiratory function stable and patient connected to nasal cannula oxygen Cardiovascular status: blood pressure returned to baseline and stable Postop Assessment: no apparent nausea or vomiting Anesthetic complications: no   No notable events documented.  Last Vitals:  Vitals:   07/27/22 1601 07/27/22 1602  BP: (!) 133/119 (!) 134/102  Pulse: 75 74  Resp: 13 14  Temp:    SpO2: 94%     Last Pain:  Vitals:   07/27/22 1602  PainSc: 7                  Monta Maiorana S

## 2022-07-28 ENCOUNTER — Encounter (HOSPITAL_BASED_OUTPATIENT_CLINIC_OR_DEPARTMENT_OTHER): Payer: Self-pay | Admitting: Obstetrics and Gynecology

## 2022-07-28 LAB — SURGICAL PATHOLOGY

## 2022-07-28 NOTE — Addendum Note (Signed)
Addendum  created 07/28/22 0803 by Suan Halter, CRNA   Charge Capture section accepted

## 2022-09-15 ENCOUNTER — Ambulatory Visit (INDEPENDENT_AMBULATORY_CARE_PROVIDER_SITE_OTHER): Payer: 59 | Admitting: Family Medicine

## 2022-09-15 ENCOUNTER — Encounter: Payer: Self-pay | Admitting: Family Medicine

## 2022-09-15 VITALS — BP 129/83 | HR 74 | Temp 98.4°F | Ht 65.0 in | Wt 273.0 lb

## 2022-09-15 DIAGNOSIS — J029 Acute pharyngitis, unspecified: Secondary | ICD-10-CM

## 2022-09-15 LAB — CBC WITH DIFFERENTIAL/PLATELET
Absolute Monocytes: 474 cells/uL (ref 200–950)
Basophils Absolute: 63 cells/uL (ref 0–200)
Basophils Relative: 0.8 %
Eosinophils Absolute: 111 cells/uL (ref 15–500)
Eosinophils Relative: 1.4 %
HCT: 43.7 % (ref 35.0–45.0)
Hemoglobin: 13.6 g/dL (ref 11.7–15.5)
Lymphs Abs: 3223 cells/uL (ref 850–3900)
MCH: 26.6 pg — ABNORMAL LOW (ref 27.0–33.0)
MCHC: 31.1 g/dL — ABNORMAL LOW (ref 32.0–36.0)
MCV: 85.4 fL (ref 80.0–100.0)
MPV: 12.4 fL (ref 7.5–12.5)
Monocytes Relative: 6 %
Neutro Abs: 4029 cells/uL (ref 1500–7800)
Neutrophils Relative %: 51 %
Platelets: 270 10*3/uL (ref 140–400)
RBC: 5.12 10*6/uL — ABNORMAL HIGH (ref 3.80–5.10)
RDW: 14.7 % (ref 11.0–15.0)
Total Lymphocyte: 40.8 %
WBC: 7.9 10*3/uL (ref 3.8–10.8)

## 2022-09-15 LAB — POCT RAPID STREP A (OFFICE): Rapid Strep A Screen: NEGATIVE

## 2022-09-15 NOTE — Progress Notes (Signed)
Debra [name] - 41 y.o. female MRN [id number]  Date of birth: 03-11-1981  Subjective Chief Complaint  Patient presents with   Sore Throat    HPI Debra [name] is a 41 y.o. female here today with complaint of sore throat.  She has had this for nearly 2 weeks.  She has noticed some mild postnasal drainage.  She does have history of allergies.  She is currently pregnant.  She has taken COVID test at home which have been negative.  She denies fever, chills, cough, nausea or vomiting.  ROS:  A comprehensive ROS was completed and negative except as noted per HPI  Allergies  Allergen Reactions   Dust Mite Extract Other (See Comments)    Past Medical History:  Diagnosis Date   Acne    Allergic rhinitis    Cold sore 11/02/2016   Depression    GERD (gastroesophageal reflux disease)    Hearing loss of both ears    per pt right > left,  does not wear hearing aids   IDA (iron deficiency anemia)    Incompetent cervix    Infertility, female    Mild obstructive sleep apnea    per pt had sleep study was very mild osa,  did not meet critiria for insurance to pay but pt paid out of pocket since it helps due to dust mite allergy   Prior pregnancy with incompetent cervix in second trimester, antepartum 04/01/2022   03-31-2022 pre-mature ruptured membrane [redacted]wk gestation, nonviable   Uterine polyp     Past Surgical History:  Procedure Laterality Date   CERCLAGE LAPAROSCOPIC ABDOMINAL N/A 07/27/2022   Procedure: CERCLAGE LAPAROSCOPIC CERVICO- ABDOMINAL AND LYSIS OF ADHESIONS;  Surgeon: Governor Specking, MD;  Location: [facility];  Service: Gynecology;  Laterality: N/A;   DILATION AND CURETTAGE OF UTERUS N/A 04/01/2022   Procedure: DILATATION AND CURETTAGE;  Surgeon: Azucena Fallen, MD;  Location: [facility];  Service: Gynecology;  Laterality: N/A;   HYSTEROSCOPY N/A 07/27/2022   Procedure: HYSTEROSCOPY WITH ENDOMETRIAL BIOPSY;  Surgeon: Governor Specking, MD;   Location: [facility];  Service: Gynecology;  Laterality: N/A;    Social History   Socioeconomic History   Marital status: Married    Spouse name: Not on file   Number of children: Not on file   Years of education: Not on file   Highest education level: Not on file  Occupational History   Not on file  Tobacco Use   Smoking status: Never   Smokeless tobacco: Never  Vaping Use   Vaping Use: Never used  Substance and Sexual Activity   Alcohol use: Yes    Comment: occasional   Drug use: Never   Sexual activity: Yes    Partners: Male    Birth control/protection: Condom  Other Topics Concern   Not on file  Social History Narrative   Not on file   Social Determinants of Health   Financial Resource Strain: Not on file  Food Insecurity: Not on file  Transportation Needs: Not on file  Physical Activity: Not on file  Stress: Not on file  Social Connections: Not on file    Family History  Problem Relation Age of Onset   Breast cancer Mother    Allergic rhinitis Mother    Eczema Mother    Allergic rhinitis Father    Allergic rhinitis Brother     Health Maintenance  Topic Date Due   PAP SMEAR-Modifier  01/19/2023 (Originally 02/17/2020)  Hepatitis C Screening  06/09/2023 (Originally 04/24/2000)   COVID-19 Vaccine (6 - Moderna risk series) 09/30/2022   TETANUS/TDAP  06/01/2030   INFLUENZA VACCINE  Completed   HIV Screening  Completed   HPV VACCINES  Aged Out     ----------------------------------------------------------------------------------------------------------------------------------------------------------------------------------------------------------------- Physical Exam BP 129/83 (BP Location: Right Arm, Patient Position: Sitting, Cuff Size: Large)   Pulse 74   Temp 98.4 F (36.9 C) (Oral)   Ht '5\' 5"'$  (1.651 m)   Wt 273 lb (123.8 kg)   SpO2 100%   BMI 45.43 kg/m   Physical Exam Constitutional:      Appearance: She is well-developed.   HENT:     Head: Normocephalic and atraumatic.     Right Ear: Tympanic membrane normal.     Left Ear: Tympanic membrane normal.     Mouth/Throat:     Tonsils: No tonsillar exudate or tonsillar abscesses.  Cardiovascular:     Rate and Rhythm: Normal rate and regular rhythm.     Heart sounds: Normal heart sounds.  Pulmonary:     Effort: Pulmonary effort is normal.     Breath sounds: Normal breath sounds.  Musculoskeletal:     Cervical back: Neck supple.  Neurological:     Mental Status: She is alert.     ------------------------------------------------------------------------------------------------------------------------------------------------------------------------------------------------------------------- Assessment and Plan  Pharyngitis She has had this for couple weeks.  Point-of-care rapid strep is negative.  No red flags on exam.  Likely related to chronic allergies.  She can use saline nasal rinses.  She may continue antihistamine as needed.   No orders of the defined types were placed in this encounter.   No follow-ups on file.    This visit occurred during the SARS-CoV-2 public health emergency.  Safety protocols were in place, including screening questions prior to the visit, additional usage of staff PPE, and extensive cleaning of exam room while observing appropriate contact time as indicated for disinfecting solutions.

## 2022-09-15 NOTE — Patient Instructions (Signed)

## 2022-09-18 DIAGNOSIS — J029 Acute pharyngitis, unspecified: Secondary | ICD-10-CM | POA: Insufficient documentation

## 2022-09-18 NOTE — Assessment & Plan Note (Signed)
She has had this for couple weeks.  Point-of-care rapid strep is negative.  No red flags on exam.  Likely related to chronic allergies.  She can use saline nasal rinses.  She may continue antihistamine as needed.

## 2022-09-26 ENCOUNTER — Encounter (HOSPITAL_COMMUNITY): Payer: Self-pay | Admitting: *Deleted

## 2022-09-26 ENCOUNTER — Inpatient Hospital Stay (HOSPITAL_COMMUNITY): Payer: 59

## 2022-09-26 ENCOUNTER — Inpatient Hospital Stay (HOSPITAL_COMMUNITY)
Admission: AD | Admit: 2022-09-26 | Discharge: 2022-09-27 | Disposition: A | Discharge status: Home / Self Care | Payer: 59 | Attending: Obstetrics and Gynecology | Admitting: Obstetrics and Gynecology

## 2022-09-26 DIAGNOSIS — Z6740 Type O blood, Rh positive: Secondary | ICD-10-CM | POA: Insufficient documentation

## 2022-09-26 DIAGNOSIS — O26891 Other specified pregnancy related conditions, first trimester: Secondary | ICD-10-CM | POA: Insufficient documentation

## 2022-09-26 DIAGNOSIS — O039 Complete or unspecified spontaneous abortion without complication: Secondary | ICD-10-CM

## 2022-09-26 DIAGNOSIS — Z3A01 Less than 8 weeks gestation of pregnancy: Secondary | ICD-10-CM | POA: Insufficient documentation

## 2022-09-26 DIAGNOSIS — O418X10 Other specified disorders of amniotic fluid and membranes, first trimester, not applicable or unspecified: Secondary | ICD-10-CM

## 2022-09-26 DIAGNOSIS — O208 Other hemorrhage in early pregnancy: Secondary | ICD-10-CM | POA: Diagnosis present

## 2022-09-26 LAB — CBC WITH DIFFERENTIAL/PLATELET
Abs Immature Granulocytes: 0.04 10*3/uL (ref 0.00–0.07)
Basophils Absolute: 0.1 10*3/uL (ref 0.0–0.1)
Basophils Relative: 1 %
Eosinophils Absolute: 0.2 10*3/uL (ref 0.0–0.5)
Eosinophils Relative: 1 %
HCT: 40.4 % (ref 36.0–46.0)
Hemoglobin: 13.6 g/dL (ref 12.0–15.0)
Immature Granulocytes: 0 %
Lymphocytes Relative: 22 %
Lymphs Abs: 2.8 10*3/uL (ref 0.7–4.0)
MCH: 28 pg (ref 26.0–34.0)
MCHC: 33.7 g/dL (ref 30.0–36.0)
MCV: 83.1 fL (ref 80.0–100.0)
Monocytes Absolute: 0.7 10*3/uL (ref 0.1–1.0)
Monocytes Relative: 6 %
Neutro Abs: 8.8 10*3/uL — ABNORMAL HIGH (ref 1.7–7.7)
Neutrophils Relative %: 70 %
Platelets: 214 10*3/uL (ref 150–400)
RBC: 4.86 MIL/uL (ref 3.87–5.11)
RDW: 16 % — ABNORMAL HIGH (ref 11.5–15.5)
WBC: 12.6 10*3/uL — ABNORMAL HIGH (ref 4.0–10.5)
nRBC: 0 % (ref 0.0–0.2)

## 2022-09-26 MED ORDER — ACETAMINOPHEN 500 MG PO TABS
500.0000 mg | ORAL_TABLET | Freq: Once | ORAL | Status: AC
  Administered 2022-09-26: 500 mg via ORAL
  Filled 2022-09-26: qty 1

## 2022-09-26 NOTE — Discharge Instructions (Signed)
US OB LESS THAN 14 WEEKS WITH OB TRANSVAGINAL  Result Date: 09/26/2022 CLINICAL DATA:  Vaginal bleeding today. First-trimester pregnancy. Quantitative beta HCG is pending. Estimated gestational age by LMP is 6 weeks 3 days. EXAM: OBSTETRIC <14 WK [place] AND TRANSVAGINAL OB US TECHNIQUE: Both transabdominal and transvaginal ultrasound examinations were performed for complete evaluation of the gestation as well as the maternal uterus, adnexal regions, and pelvic cul-de-sac. Transvaginal technique was performed to assess early pregnancy. COMPARISON:  None Available. FINDINGS: Intrauterine gestational sac: A single intrauterine gestational sac is present. Yolk sac:  Yolk sac is not visualized. Embryo:  Fetal pole is identified. Cardiac Activity: Fetal cardiac activity is visualized. Heart Rate: 138 bpm CRL:  7.6 mm   6 w   5 d                  [place] EDC: 05/17/2023 Subchorionic hemorrhage: Small subchorionic hemorrhages are demonstrated. Maternal uterus/adnexae: No myometrial mass lesions are identified. The ovaries are not visualized. No free pelvic fluid. IMPRESSION: Single intrauterine pregnancy. Estimated gestational age by crown-rump length is 6 weeks 5 days. Small subchorionic hemorrhages are demonstrated. Electronically Signed   By: Lucienne Capers M.D.   On: 09/26/2022 23:19

## 2022-09-26 NOTE — MAU Note (Signed)
Pt says is an infertility pt with Dr Kerin Perna - in [facility] This morning at 1145- internal vag U/S- had HR  VB started at 720pm-  In Triage - on bed pad At 745pm- Dr suggested  to take progesterone - it did not help bleeding. Cramping started at 830pm Has passed clots- unsure if tissue

## 2022-09-26 NOTE — MAU Provider Note (Signed)
History     CSN: [id number]  Arrival date and time: 09/26/22 2127   Event Date/Time   First Provider Initiated Contact with Patient 09/26/22 2223      Chief Complaint  Patient presents with   Vaginal Bleeding   Debra [name] is a 41 y.o. G2P1 at [redacted]w[redacted]d(She had a five day embryo implanted on 9/13) who presents today with vaginal bleeding. She states that around 1930 she had some spotting and then it became very heavy bleeding with clots. She is passing clots about the size of a small apple. She spoke with Dr. YDarreld Mcleanand he suggested she take 118mof progesterone in oil IM. She did do this prior to arrival, but states that it did not help the bleeding. BP is elevated today. Patient denies CHTN and states that her BP is always elevated when she comes here due past experiences here.   Vaginal Bleeding The patient's primary symptoms include vaginal bleeding. This is a new problem. The current episode started today. The problem occurs constantly. The problem has been unchanged. The pain is mild. The problem affects both sides. She is pregnant. The vaginal discharge was bloody. The vaginal bleeding is heavier than menses. She has been passing clots. She has not been passing tissue. Nothing aggravates the symptoms. She has tried nothing for the symptoms.    OB History     Gravida  2   Para  1   Term      Preterm      AB      Living         SAB      IAB      Ectopic      Multiple  0   Live Births              Past Medical History:  Diagnosis Date   Acne    Allergic rhinitis    Cold sore 11/02/2016   Depression    GERD (gastroesophageal reflux disease)    Hearing loss of both ears    per pt right > left,  does not wear hearing aids   IDA (iron deficiency anemia)    Incompetent cervix    Infertility, female    Mild obstructive sleep apnea    per pt had sleep study was very mild osa,  did not meet critiria for insurance to pay but pt paid out of pocket since  it helps due to dust mite allergy   Prior pregnancy with incompetent cervix in second trimester, antepartum 04/01/2022   03-31-2022 pre-mature ruptured membrane [redacted]wk gestation, nonviable   Uterine polyp     Past Surgical History:  Procedure Laterality Date   CERCLAGE LAPAROSCOPIC ABDOMINAL N/A 07/27/2022   Procedure: CERCLAGE LAPAROSCOPIC CERVICO- ABDOMINAL AND LYSIS OF ADHESIONS;  Surgeon: YaGovernor SpeckingMD;  [facility] Service: [facility];  Laterality: N/A;   DILATION AND CURETTAGE OF UTERUS N/A 04/01/2022   Procedure: DILATATION AND CURETTAGE;  Surgeon: MoAzucena FallenMD;  Location: [facility];  Service: Gynecology;  Laterality: N/A;   HYSTEROSCOPY N/A 07/27/2022   Procedure: HYSTEROSCOPY WITH ENDOMETRIAL BIOPSY;  Surgeon: YaGovernor SpeckingMD;  [facility];  Laterality: N/A;    Family History  Problem Relation Age of Onset   Breast cancer Mother    Allergic rhinitis Mother    Eczema Mother    Allergic rhinitis Father    Allergic rhinitis Brother     Social  History   Tobacco Use   Smoking status: Never   Smokeless tobacco: Never  Vaping Use   Vaping Use: Never used  Substance Use Topics   Alcohol use: Yes    Comment: occasional   Drug use: Never    Allergies:  Allergies  Allergen Reactions   Dust Mite Extract Other (See Comments)    Medications Prior to Admission  Medication Sig Dispense Refill Last Dose   aspirin EC (ASPIRIN LOW DOSE) 81 MG tablet       calcium citrate (CALCITRATE - DOSED IN MG ELEMENTAL CALCIUM) 950 (200 Ca) MG tablet Take 4,200 mg of elemental calcium by mouth daily.      Cholecalciferol (VITAMIN D3) 125 MCG (5000 UT) CAPS Take 1 capsule by mouth daily.      Cyanocobalamin (B-12) 5000 MCG CAPS Take 1 capsule by mouth daily.      DOTTI 0.1 MG/24HR patch Place onto the skin.      Ergocalciferol (VITAMIN D2 PO) Take 1,000 Int'l Units/day by mouth daily.      estradiol  (ESTRACE) 2 MG tablet Take by mouth.      Ferrous Sulfate (IRON) 325 (65 Fe) MG TABS Take 1 tablet by mouth daily.      folic acid (FOLVITE) 1 MG tablet Take 1 mg by mouth daily.      lansoprazole (PREVACID) 15 MG capsule Take 15 mg by mouth daily at 12 noon.      niacin (VITAMIN B3) 500 MG tablet Take 500 mg by mouth at bedtime.      Omega-3 Fatty Acids (OMEGA-3 FISH OIL PO) Take 5,000 mg by mouth daily.      OVER THE COUNTER MEDICATION Take 1 tablet by mouth daily. Lions' mane supplement (mushroom extract)  2000 mg      Prenatal Vit-Fe Fumarate-FA (PRENATAL MULTIVITAMIN) TABS tablet Take 1 tablet by mouth daily at 12 noon.      progesterone 50 MG/ML injection SMARTSIG:Milliliter(s) IM      PSYLLIUM FIBER PO Take 3,000 mg by mouth daily.      Riboflavin (B-2-400 PO) Take 1 tablet by mouth daily.      valACYclovir (VALTREX) 1000 MG tablet TAKE 1 TABLET BY MOUTH  DAILY (Patient taking differently: Take 1,000 mg by mouth as needed.) 90 tablet 3    Vitamin A 3 MG (10000 UT) TABS Take 1 tablet by mouth daily.       Review of Systems  Genitourinary:  Positive for vaginal bleeding.  All other systems reviewed and are negative.  Physical Exam   Blood pressure (!) 157/88, pulse 93, temperature 98.1 F (36.7 C), temperature source Oral, resp. rate 20, height '5\' 4"'$  (1.626 m), weight 123.8 kg, last menstrual period 07/04/2022.  Physical Exam Constitutional:      Appearance: She is well-developed.  HENT:     Head: Normocephalic.  Eyes:     Pupils: Pupils are equal, round, and reactive to light.  Cardiovascular:     Rate and Rhythm: Normal rate and regular rhythm.     Heart sounds: Normal heart sounds.  Pulmonary:     Effort: Pulmonary effort is normal. No respiratory distress.     Breath sounds: Normal breath sounds.  Abdominal:     Palpations: Abdomen is soft.     Tenderness: There is no abdominal tenderness.  Genitourinary:    Vagina: No bleeding.     Comments: External: no  lesion Vagina: bright red blood with clots     Musculoskeletal:  General: Normal range of motion.     Cervical back: Normal range of motion and neck supple.  Skin:    General: Skin is warm and dry.  Neurological:     Mental Status: She is alert and oriented to person, place, and time.  Psychiatric:        Mood and Affect: Mood normal.        Behavior: Behavior normal.     Results for orders placed or performed during the hospital encounter of 09/26/22 (from the past 24 hour(s))  CBC with Differential/Platelet     Status: Abnormal   Collection Time: 09/26/22 10:50 PM  Result Value Ref Range   WBC 12.6 (H) 4.0 - 10.5 K/uL   RBC 4.86 3.87 - 5.11 MIL/uL   Hemoglobin 13.6 12.0 - 15.0 g/dL   HCT 40.4 36.0 - 46.0 %   MCV 83.1 80.0 - 100.0 fL   MCH 28.0 26.0 - 34.0 pg   MCHC 33.7 30.0 - 36.0 g/dL   RDW 16.0 (H) 11.5 - 15.5 %   Platelets 214 150 - 400 K/uL   nRBC 0.0 0.0 - 0.2 %   Neutrophils Relative % 70 %   Neutro Abs 8.8 (H) 1.7 - 7.7 K/uL   Lymphocytes Relative 22 %   Lymphs Abs 2.8 0.7 - 4.0 K/uL   Monocytes Relative 6 %   Monocytes Absolute 0.7 0.1 - 1.0 K/uL   Eosinophils Relative 1 %   Eosinophils Absolute 0.2 0.0 - 0.5 K/uL   Basophils Relative 1 %   Basophils Absolute 0.1 0.0 - 0.1 K/uL   Immature Granulocytes 0 %   Abs Immature Granulocytes 0.04 0.00 - 0.07 K/uL   US OB LESS THAN 14 WEEKS WITH OB TRANSVAGINAL  Result Date: 09/26/2022 CLINICAL DATA:  Vaginal bleeding today. First-trimester pregnancy. Quantitative beta HCG is pending. Estimated gestational age by LMP is 6 weeks 3 days. EXAM: OBSTETRIC <14 WK [place] AND TRANSVAGINAL OB US TECHNIQUE: Both transabdominal and transvaginal ultrasound examinations were performed for complete evaluation of the gestation as well as the maternal uterus, adnexal regions, and pelvic cul-de-sac. Transvaginal technique was performed to assess early pregnancy. COMPARISON:  None Available. FINDINGS: Intrauterine gestational sac: A  single intrauterine gestational sac is present. Yolk sac:  Yolk sac is not visualized. Embryo:  Fetal pole is identified. Cardiac Activity: Fetal cardiac activity is visualized. Heart Rate: 138 bpm CRL:  7.6 mm   6 w   5 d                  [place] EDC: 05/17/2023 Subchorionic hemorrhage: Small subchorionic hemorrhages are demonstrated. Maternal uterus/adnexae: No myometrial mass lesions are identified. The ovaries are not visualized. No free pelvic fluid. IMPRESSION: Single intrauterine pregnancy. Estimated gestational age by crown-rump length is 6 weeks 5 days. Small subchorionic hemorrhages are demonstrated. Electronically Signed   By: Lucienne Capers M.D.   On: 09/26/2022 23:19     MAU Course  Procedures  MDM  Patient given '500mg'$  tylenol prior to DC to help with cramps  Reviewed [place] results with patient and answered questions. Patient has FU appt with Dr. Darreld Mclean tomorrow morning.   Assessment and Plan   1. [redacted] weeks gestation of pregnancy   2. Subchorionic hemorrhage of placenta in first trimester, single or unspecified fetus   3. Type O blood, Rh positive    DC home in stable condition  Comfort measures reviewed  1st Trimester precautions  Bleeding precautions RX: none  Return to  MAU as needed FU with OB as planned   Follow-up Information     Governor Specking, MD Follow up.   Specialty: Obstetrics and Gynecology Contact information: [address]. [place] [address] [phone]                Jacquelynn Friend DNP, CNM  09/26/22  11:52 PM

## 2022-09-27 LAB — HCG, QUANTITATIVE, PREGNANCY: hCG, Beta Chain, Quant, S: 24130 m[IU]/mL — ABNORMAL HIGH (ref <5)

## 2022-09-27 NOTE — MAU Provider Note (Signed)
12:34 AM patient getting dressed for DC home and called out because she passed some tissue. I came to the room to inspect the tissue and it appears to be a complete gestational sac. DW Dr. Kennon Rounds, and no need to repeat US at this time since patient has appt with Dr. Darreld Mclean later this morning. Patient offered for [place] to send this to pathology or if she would like to keep this for burial at home etc. Patient and husband elected to take it home with them. Questions answered. RN will provide the patient with comfort items.   1. [redacted] weeks gestation of pregnancy   2. Subchorionic hemorrhage of placenta in first trimester, single or unspecified fetus   3. Type O blood, Rh positive   4. SAB (spontaneous abortion)    DC home in stable condition  1st Trimester precautions  RX: none  Return to MAU as needed FU with OB as planned   Follow-up Information     Governor Specking, MD Follow up.   Specialty: Obstetrics and Gynecology Contact information: [address]. [place] [address] [phone]                Kambra Beachem DNP, CNM  09/27/22  12:38 AM

## 2022-09-27 NOTE — MAU Note (Signed)
Pt passed tissue - wanted to take it home . Bleeding decreased .  Cramping less

## 2022-10-04 ENCOUNTER — Other Ambulatory Visit: Payer: Self-pay | Admitting: Family Medicine

## 2022-10-04 DIAGNOSIS — Z1231 Encounter for screening mammogram for malignant neoplasm of breast: Secondary | ICD-10-CM

## 2022-10-19 ENCOUNTER — Ambulatory Visit (INDEPENDENT_AMBULATORY_CARE_PROVIDER_SITE_OTHER): Payer: 59

## 2022-10-19 DIAGNOSIS — Z1231 Encounter for screening mammogram for malignant neoplasm of breast: Secondary | ICD-10-CM

## 2022-10-24 ENCOUNTER — Ambulatory Visit
Admission: EM | Admit: 2022-10-24 | Discharge: 2022-10-24 | Disposition: A | Discharge status: Home / Self Care | Payer: 59 | Attending: Family Medicine | Admitting: Family Medicine

## 2022-10-24 DIAGNOSIS — L03211 Cellulitis of face: Secondary | ICD-10-CM

## 2022-10-24 MED ORDER — FLUCONAZOLE 150 MG PO TABS: 150.0000 mg | ORAL_TABLET | Freq: Every day | ORAL | 0 refills | Status: DC

## 2022-10-24 MED ORDER — CEPHALEXIN 500 MG PO CAPS: 500.0000 mg | ORAL_CAPSULE | Freq: Three times a day (TID) | ORAL | 0 refills | Status: DC

## 2022-10-24 NOTE — ED Triage Notes (Signed)
Pt presents with c/o left sided facial swelling under her eye that began overnight. Pt states she recently noticed a pimple under her eye last week and has been using blemish patches.

## 2022-10-24 NOTE — ED Provider Notes (Signed)
Debra Scrape, RN  Student-NP Specialty: Student   Medical Student Note     Signed   Date of Service: 10/24/2022  9:46 AM   Signed     Expand All Collapse All     [facility] Provider Student Note For educational purposes for Medical, PA and NP students only and not part of the legal medical record.     CSN: [id number] Arrival date & time: 10/24/22  0907       History              Chief Complaint    Chief Complaint  Patient presents with   Facial Swelling      HPI Debra [name] is a 41 y.o. female.   Debra [name] presents today with complaints of facial redness, swelling, mild tenderness on waking this morning. States she's been picking at a pimple on her face, just below left lower infraorbital margin in location. Woke up to some mild swelling that extended up into the infraorbital region and decided to come in to be seen today. Denies fever, visual changes, ocular pain, otalgia, rhinorrhea, maxillary/frontal sinus tenderness, jaw pain. Redness and swelling appears localized around the pimple under her left eye, skin at site feels firm to touch, induration extending around 0.5 cm in diameter, tender, warm, with soft tissue swelling extending linearly across the thin skin of the infraorbital border that is very soft to touch, no induration.               Past Medical History:  Diagnosis Date   Acne     Allergic rhinitis     Cold sore 11/02/2016   Depression     GERD (gastroesophageal reflux disease)     Hearing loss of both ears      per pt right > left,  does not wear hearing aids   IDA (iron deficiency anemia)     Incompetent cervix     Infertility, female     Mild obstructive sleep apnea      per pt had sleep study was very mild osa,  did not meet critiria for insurance to pay but pt paid out of pocket since it helps due to dust mite allergy   Prior pregnancy with incompetent cervix in second trimester, antepartum 04/01/2022     03-31-2022 pre-mature ruptured membrane [redacted]wk gestation, nonviable   Uterine polyp            Patient Active Problem List    Diagnosis Date Noted   Pharyngitis 09/18/2022   Well adult exam 06/08/2022   Preterm delivery 04/01/2022   Incompetent cervix in pregnancy, antepartum, second trimester 04/01/2022   Pregnancy resulting from in vitro fertilization in second trimester 03/31/2022   Premature rupture of membranes in second trimester 03/31/2022   Allergic conjunctivitis of both eyes 12/16/2020   Allergic conjunctivitis 09/29/2020   Insomnia 07/18/2018   Elevated ALT measurement 07/18/2018   Snoring 01/03/2017   Chronic low back pain without sciatica 01/03/2017   Vitamin D deficiency 11/02/2016   Morbid obesity with BMI of 40.0-44.9, adult ([place]) 11/02/2016   Dermatographia 11/02/2016   Cold sore 11/02/2016   Perennial allergic rhinitis 11/02/2016   Acne 11/02/2016   Depressive disorder 11/25/2014   Herpes simplex 12/02/2013   Esophageal reflux 12/02/2013           Past Surgical History:  Procedure Laterality Date   CERCLAGE LAPAROSCOPIC ABDOMINAL N/A 07/27/2022    Procedure: CERCLAGE LAPAROSCOPIC CERVICO- ABDOMINAL AND  LYSIS OF ADHESIONS;  Surgeon: Governor Specking, MD;  Location: [facility];  Service: Gynecology;  Laterality: N/A;   DILATION AND CURETTAGE OF UTERUS N/A 04/01/2022    Procedure: DILATATION AND CURETTAGE;  Surgeon: Azucena Fallen, MD;  Location: [facility];  Service: Gynecology;  Laterality: N/A;   HYSTEROSCOPY N/A 07/27/2022    Procedure: HYSTEROSCOPY WITH ENDOMETRIAL BIOPSY;  Surgeon: Governor Specking, MD;  Location[facility];  Service: Gynecology;  Laterality: N/A;      OB History       Gravida  2   Para  1   Term      Preterm      AB      Living           SAB      IAB      Ectopic      Multiple  0   Live Births                    Home Medications                       Prior to Admission  medications   Medication Sig Start Date End Date Taking? Authorizing Provider  aspirin EC (ASPIRIN LOW DOSE) 81 MG tablet   08/29/22     [provider]  calcium citrate (CALCITRATE - DOSED IN MG ELEMENTAL CALCIUM) 950 (200 Ca) MG tablet Take 4,200 mg of elemental calcium by mouth daily.       [provider]  Cholecalciferol (VITAMIN D3) 125 MCG (5000 UT) CAPS Take 1 capsule by mouth daily.       [provider]  Cyanocobalamin (B-12) 5000 MCG CAPS Take 1 capsule by mouth daily.       [provider]  DOTTI 0.1 MG/24HR patch Place onto the skin. Patient not taking: Reported on 10/24/2022 09/13/22     [provider]  Ergocalciferol (VITAMIN D2 PO) Take 1,000 Int'l Units/day by mouth daily.       [provider]  estradiol (ESTRACE) 2 MG tablet Take by mouth. Patient not taking: Reported on 10/24/2022 07/30/22     [provider]  Ferrous Sulfate (IRON) 325 (65 Fe) MG TABS Take 1 tablet by mouth daily.       [provider]  folic acid (FOLVITE) 1 MG tablet Take 1 mg by mouth daily.       [provider]  lansoprazole (PREVACID) 15 MG capsule Take 15 mg by mouth daily at 12 noon.       [provider]  niacin (VITAMIN B3) 500 MG tablet Take 500 mg by mouth at bedtime.       [provider]  Omega-3 Fatty Acids (OMEGA-3 FISH OIL PO) Take 5,000 mg by mouth daily.       [provider]  OVER THE COUNTER MEDICATION Take 1 tablet by mouth daily. Lions' mane supplement (mushroom extract)  2000 mg       [provider]  Prenatal Vit-Fe Fumarate-FA (PRENATAL MULTIVITAMIN) TABS tablet Take 1 tablet by mouth daily at 12 noon.       [provider]  progesterone 50 MG/ML injection SMARTSIG:Milliliter(s) IM Patient not taking: Reported on 10/24/2022 09/12/22     [provider]  PSYLLIUM FIBER PO Take 3,000 mg by mouth daily.       [provider]  Riboflavin (B-2-400 PO)  Take 1 tablet  by mouth daily.       [provider]  valACYclovir (VALTREX) 1000 MG tablet TAKE 1 TABLET BY MOUTH  DAILY Patient taking differently: Take 1,000 mg by mouth as needed. 12/01/20     Orma Render, NP  Vitamin A 3 MG (10000 UT) TABS Take 1 tablet by mouth daily.       [provider]      Family History      Family History  Problem Relation Age of Onset   Breast cancer Mother     Allergic rhinitis Mother     Eczema Mother     Allergic rhinitis Father     Allergic rhinitis Brother        Social History Social History         Tobacco Use   Smoking status: Never   Smokeless tobacco: Never  Vaping Use   Vaping Use: Never used  Substance Use Topics   Alcohol use: Yes      Comment: occasional   Drug use: Never        Allergies              Dust mite extract     Review of Systems Review of Systems  HENT:  Positive for facial swelling. Negative for congestion, ear pain, sinus pressure, sinus pain and sore throat.   Eyes:  Negative for photophobia, pain, discharge, redness, itching and visual disturbance.  Skin:  Positive for color change (erythema). Negative for pallor, rash and wound.  All other systems reviewed and are negative.       Physical Exam Updated Vital Signs BP 133/85 (BP Location: Right Arm)   Pulse 86   Temp 98.9 F (37.2 C) (Oral)   Resp 14   Wt 121.6 kg   LMP 10/07/2022 (Exact Date)   SpO2 98%   Breastfeeding No   BMI 46.00 kg/m    Physical Exam Vitals and nursing note reviewed.  Constitutional:      Appearance: Normal appearance.  HENT:     Head: Normocephalic and atraumatic.     Right Ear: Tympanic membrane normal.     Left Ear: Tympanic membrane normal.     Ears:     Comments: Right external ear canal pink/red, slightly more narrow than the left.     Nose: Nose normal.     Mouth/Throat:     Mouth: Mucous membranes are moist.     Pharynx: Oropharynx is clear.  Eyes:     Extraocular Movements:  Extraocular movements intact.     Conjunctiva/sclera: Conjunctivae normal.     Pupils: Pupils are equal, round, and reactive to light.  Musculoskeletal:     Cervical back: Neck supple.  Skin:    General: Skin is warm and dry.     Coloration: Skin is not jaundiced or pale.     Findings: Erythema present. No bruising, lesion or rash.     Comments: Erythema extending across left lower infraorbital border, not extending into lower eyelid/orbital region. Induration localized to small pimple on upper left cheek with small area of central lightening in color. No fluctuation  Neurological:     General: No focal deficit present.     Mental Status: She is alert.          ED Treatments / Results  Labs (all labs ordered are listed, but only abnormal results are displayed) Labs Reviewed - No data to display   EKG   Radiology Imaging Results (Last 48  hours)  No results found.     Procedures Procedures (including critical care time)   Medications Ordered in ED Medications - No data to display     Initial Impression / Assessment and Plan / ED Course  I have reviewed the triage vital signs and the nursing notes.   Pertinent labs & imaging results that were available during my care of the patient were reviewed by me and considered in my medical decision making (see chart for details).     Prescribing Keflex for localized skin infection. No fever, fluctuation, localized pocket of infection/concern for abscess. Patient requested treatment for subsequent yeast infection following antibiotics, prescribing fluconazole accordingly. Instructed patient to follow up with PCP for any new or concerning symptoms.   Final Clinical Impressions(s) / ED Diagnoses    Final diagnoses:  None      New Prescriptions    New Prescriptions    No medications on file             Electronically signed by Debra Scrape, RN at 10/24/2022 10:09 AM    Patient seen and examined along with student.   Agree with all documentation and medical decision making.  Doyne Keel, [place]   Arrived  [id number]   Discharged     Raylene Everts, MD 10/24/22 1147

## 2022-10-24 NOTE — Discharge Instructions (Signed)
Take the antibiotic 3 times a day Fill and take the Diflucan if needed Warm compresses may help Call or return if worse at any time instead of better

## 2022-10-24 NOTE — Medical Student Note (Signed)
[facility] Provider Student Note For educational purposes for Medical, PA and NP students only and not part of the legal medical record.   CSN: [id number] Arrival date & time: 10/24/22  0907      History   Chief Complaint Chief Complaint  Patient presents with   Facial Swelling    HPI Milaya [place] Kretsch is a 41 y.o. female.  Kenndra presents today with complaints of facial redness, swelling, mild tenderness on waking this morning. States she's been picking at a pimple on her face, just below left lower infraorbital margin in location. Woke up to some mild swelling that extended up into the infraorbital region and decided to come in to be seen today. Denies fever, visual changes, ocular pain, otalgia, rhinorrhea, maxillary/frontal sinus tenderness, jaw pain. Redness and swelling appears localized around the pimple under her left eye, skin at site feels firm to touch, induration extending around 0.5 cm in diameter, tender, warm, with soft tissue swelling extending linearly across the thin skin of the infraorbital border that is very soft to touch, no induration.       Past Medical History:  Diagnosis Date   Acne    Allergic rhinitis    Cold sore 11/02/2016   Depression    GERD (gastroesophageal reflux disease)    Hearing loss of both ears    per pt right > left,  does not wear hearing aids   IDA (iron deficiency anemia)    Incompetent cervix    Infertility, female    Mild obstructive sleep apnea    per pt had sleep study was very mild osa,  did not meet critiria for insurance to pay but pt paid out of pocket since it helps due to dust mite allergy   Prior pregnancy with incompetent cervix in second trimester, antepartum 04/01/2022   03-31-2022 pre-mature ruptured membrane [redacted]wk gestation, nonviable   Uterine polyp     Patient Active Problem List   Diagnosis Date Noted   Pharyngitis 09/18/2022   Well adult exam 06/08/2022   Preterm delivery 04/01/2022    Incompetent cervix in pregnancy, antepartum, second trimester 04/01/2022   Pregnancy resulting from in vitro fertilization in second trimester 03/31/2022   Premature rupture of membranes in second trimester 03/31/2022   Allergic conjunctivitis of both eyes 12/16/2020   Allergic conjunctivitis 09/29/2020   Insomnia 07/18/2018   Elevated ALT measurement 07/18/2018   Snoring 01/03/2017   Chronic low back pain without sciatica 01/03/2017   Vitamin D deficiency 11/02/2016   Morbid obesity with BMI of 40.0-44.9, adult ([place]) 11/02/2016   Dermatographia 11/02/2016   Cold sore 11/02/2016   Perennial allergic rhinitis 11/02/2016   Acne 11/02/2016   Depressive disorder 11/25/2014   Herpes simplex 12/02/2013   Esophageal reflux 12/02/2013    Past Surgical History:  Procedure Laterality Date   CERCLAGE LAPAROSCOPIC ABDOMINAL N/A 07/27/2022   Procedure: CERCLAGE LAPAROSCOPIC CERVICO- ABDOMINAL AND LYSIS OF ADHESIONS;  Surgeon: Governor Specking, MD;  Location: [facility];  Service: Gynecology;  Laterality: N/A;   DILATION AND CURETTAGE OF UTERUS N/A 04/01/2022   Procedure: DILATATION AND CURETTAGE;  Surgeon: Azucena Fallen, MD;  Location: [facility];  Service: Gynecology;  Laterality: N/A;   HYSTEROSCOPY N/A 07/27/2022   Procedure: HYSTEROSCOPY WITH ENDOMETRIAL BIOPSY;  Surgeon: Governor Specking, MD;  Location: [facility];  Service: Gynecology;  Laterality: N/A;    OB History     Gravida  2   Para  1  Term      Preterm      AB      Living         SAB      IAB      Ectopic      Multiple  0   Live Births               Home Medications    Prior to Admission medications   Medication Sig Start Date End Date Taking? Authorizing Provider  aspirin EC (ASPIRIN LOW DOSE) 81 MG tablet  08/29/22   [provider]  calcium citrate (CALCITRATE - DOSED IN MG ELEMENTAL CALCIUM) 950 (200 Ca) MG tablet Take 4,200 mg of elemental calcium by  mouth daily.    [provider]  Cholecalciferol (VITAMIN D3) 125 MCG (5000 UT) CAPS Take 1 capsule by mouth daily.    [provider]  Cyanocobalamin (B-12) 5000 MCG CAPS Take 1 capsule by mouth daily.    [provider]  DOTTI 0.1 MG/24HR patch Place onto the skin. Patient not taking: Reported on 10/24/2022 09/13/22   [provider]  Ergocalciferol (VITAMIN D2 PO) Take 1,000 Int'l Units/day by mouth daily.    [provider]  estradiol (ESTRACE) 2 MG tablet Take by mouth. Patient not taking: Reported on 10/24/2022 07/30/22   [provider]  Ferrous Sulfate (IRON) 325 (65 Fe) MG TABS Take 1 tablet by mouth daily.    [provider]  folic acid (FOLVITE) 1 MG tablet Take 1 mg by mouth daily.    [provider]  lansoprazole (PREVACID) 15 MG capsule Take 15 mg by mouth daily at 12 noon.    [provider]  niacin (VITAMIN B3) 500 MG tablet Take 500 mg by mouth at bedtime.    [provider]  Omega-3 Fatty Acids (OMEGA-3 FISH OIL PO) Take 5,000 mg by mouth daily.    [provider]  OVER THE COUNTER MEDICATION Take 1 tablet by mouth daily. Lions' mane supplement (mushroom extract)  2000 mg    [provider]  Prenatal Vit-Fe Fumarate-FA (PRENATAL MULTIVITAMIN) TABS tablet Take 1 tablet by mouth daily at 12 noon.    [provider]  progesterone 50 MG/ML injection SMARTSIG:Milliliter(s) IM Patient not taking: Reported on 10/24/2022 09/12/22   [provider]  PSYLLIUM FIBER PO Take 3,000 mg by mouth daily.    [provider]  Riboflavin (B-2-400 PO) Take 1 tablet by mouth daily.    [provider]  valACYclovir (VALTREX) 1000 MG tablet TAKE 1 TABLET BY MOUTH  DAILY Patient taking differently: Take 1,000 mg by mouth as needed. 12/01/20   Orma Render, NP  Vitamin A 3 MG (10000 UT) TABS Take 1 tablet by mouth daily.    [provider]    Family  History Family History  Problem Relation Age of Onset   Breast cancer Mother    Allergic rhinitis Mother    Eczema Mother    Allergic rhinitis Father    Allergic rhinitis Brother     Social History Social History   Tobacco Use   Smoking status: Never   Smokeless tobacco: Never  Vaping Use   Vaping Use: Never used  Substance Use Topics   Alcohol use: Yes    Comment: occasional   Drug use: Never     Allergies   Dust mite extract   Review of Systems Review of Systems  HENT:  Positive for facial swelling. Negative  for congestion, ear pain, sinus pressure, sinus pain and sore throat.   Eyes:  Negative for photophobia, pain, discharge, redness, itching and visual disturbance.  Skin:  Positive for color change (erythema). Negative for pallor, rash and wound.  All other systems reviewed and are negative.    Physical Exam Updated Vital Signs BP 133/85 (BP Location: Right Arm)   Pulse 86   Temp 98.9 F (37.2 C) (Oral)   Resp 14   Wt 121.6 kg   LMP 10/07/2022 (Exact Date)   SpO2 98%   Breastfeeding No   BMI 46.00 kg/m   Physical Exam Vitals and nursing note reviewed.  Constitutional:      Appearance: Normal appearance.  HENT:     Head: Normocephalic and atraumatic.     Right Ear: Tympanic membrane normal.     Left Ear: Tympanic membrane normal.     Ears:     Comments: Right external ear canal pink/red, slightly more narrow than the left.     Nose: Nose normal.     Mouth/Throat:     Mouth: Mucous membranes are moist.     Pharynx: Oropharynx is clear.  Eyes:     Extraocular Movements: Extraocular movements intact.     Conjunctiva/sclera: Conjunctivae normal.     Pupils: Pupils are equal, round, and reactive to light.  Musculoskeletal:     Cervical back: Neck supple.  Skin:    General: Skin is warm and dry.     Coloration: Skin is not jaundiced or pale.     Findings: Erythema present. No bruising, lesion or rash.     Comments: Erythema extending across  left lower infraorbital border, not extending into lower eyelid/orbital region. Induration localized to small pimple on upper left cheek with small area of central lightening in color. No fluctuation  Neurological:     General: No focal deficit present.     Mental Status: She is alert.      ED Treatments / Results  Labs (all labs ordered are listed, but only abnormal results are displayed) Labs Reviewed - No data to display  EKG  Radiology No results found.  Procedures Procedures (including critical care time)  Medications Ordered in ED Medications - No data to display   Initial Impression / Assessment and Plan / ED Course  I have reviewed the triage vital signs and the nursing notes.  Pertinent labs & imaging results that were available during my care of the patient were reviewed by me and considered in my medical decision making (see chart for details).     Prescribing Keflex for localized skin infection. No fever, fluctuation, localized pocket of infection/concern for abscess. Patient requested treatment for subsequent yeast infection following antibiotics, prescribing fluconazole accordingly. Instructed patient to follow up with PCP for any new or concerning symptoms.  Final Clinical Impressions(s) / ED Diagnoses   Final diagnoses:  None    New Prescriptions New Prescriptions   No medications on file

## 2023-02-12 IMAGING — MG MM DIGITAL SCREENING BILAT W/ TOMO AND CAD
8 series · 8 of 24 positions shown · non-contrast
Comparison: Previous exam(s).

CLINICAL DATA: Screening.

EXAM:
DIGITAL SCREENING BILATERAL MAMMOGRAM WITH TOMOSYNTHESIS AND CAD
TECHNIQUE: Bilateral screening digital craniocaudal and mediolateral oblique
mammograms were obtained. Bilateral screening digital breast
tomosynthesis was performed. The images were evaluated with
computer-aided detection.

[R CC synth-2D]
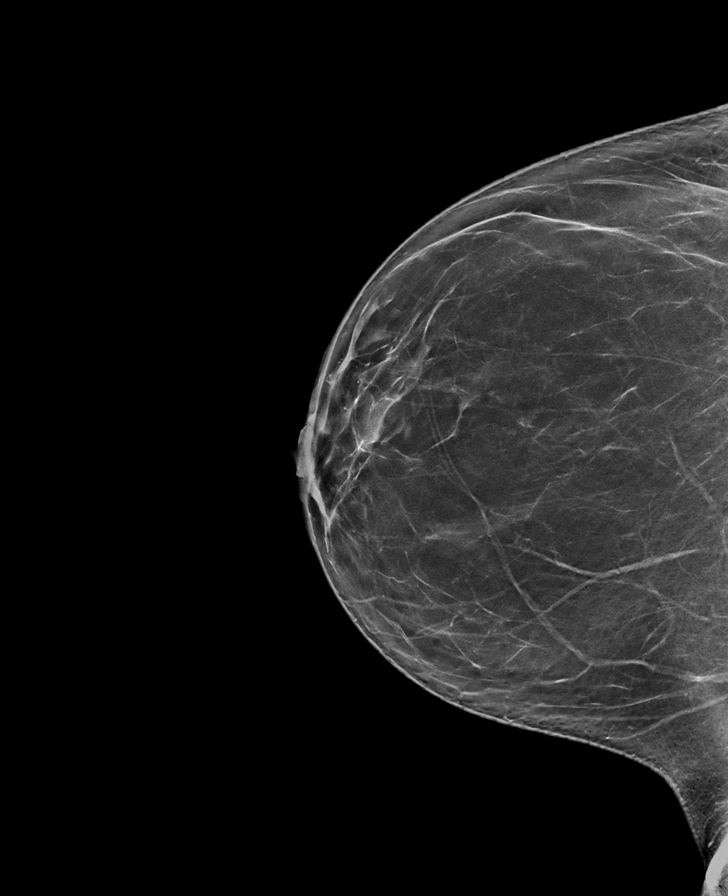

[L MLO synth-2D]
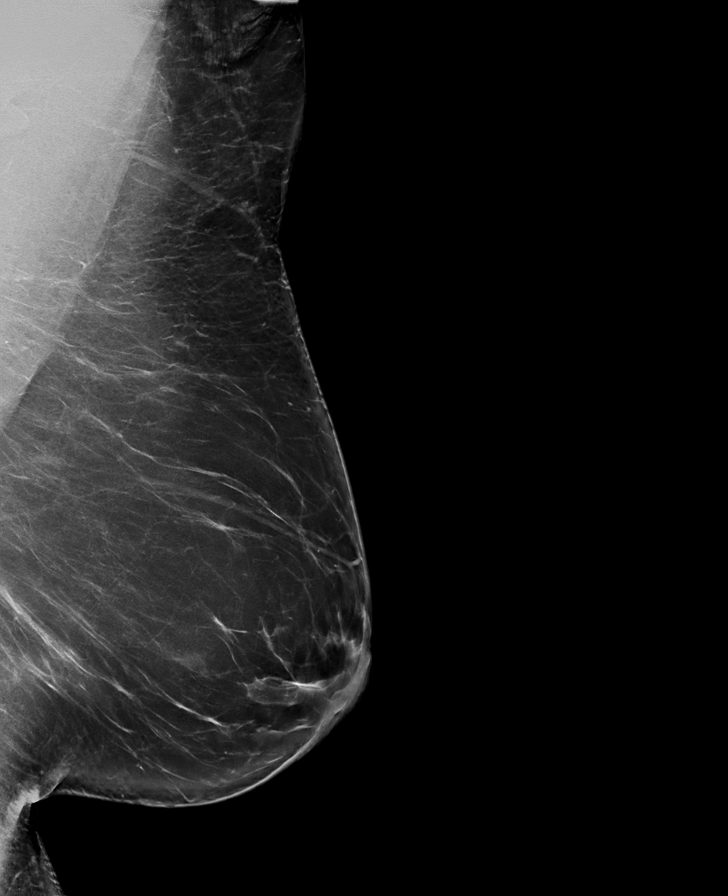

[R MLO synth-2D]
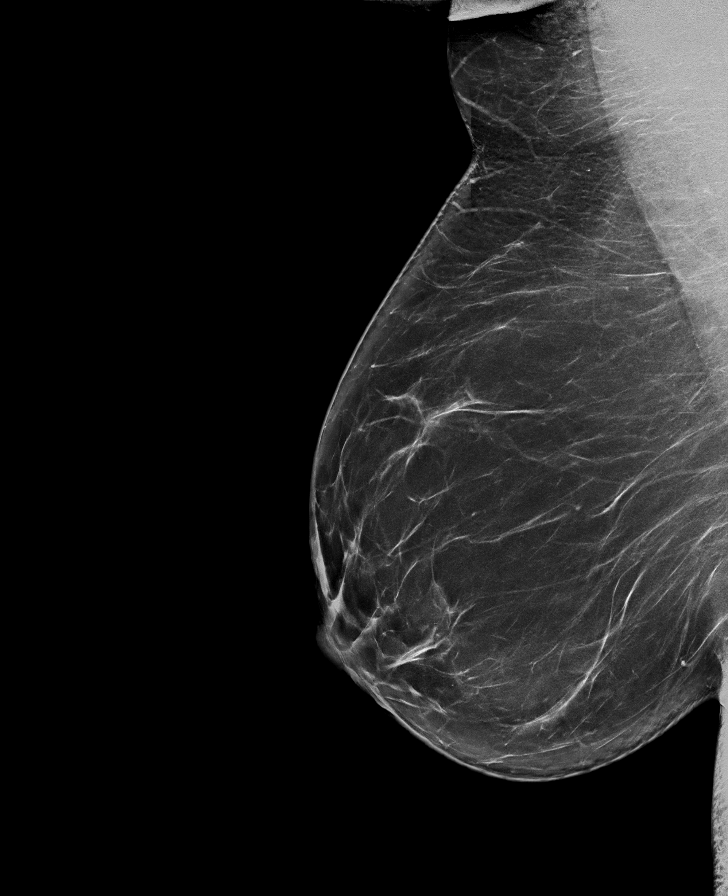

[L CC synth-2D]
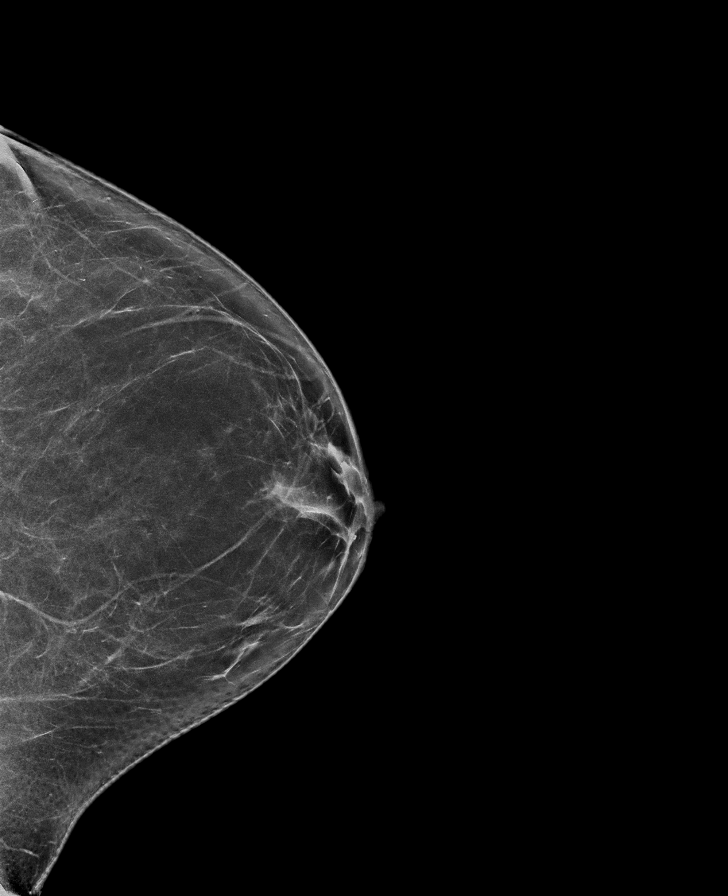

[R CC tomo · tomo slice 38/75.0]
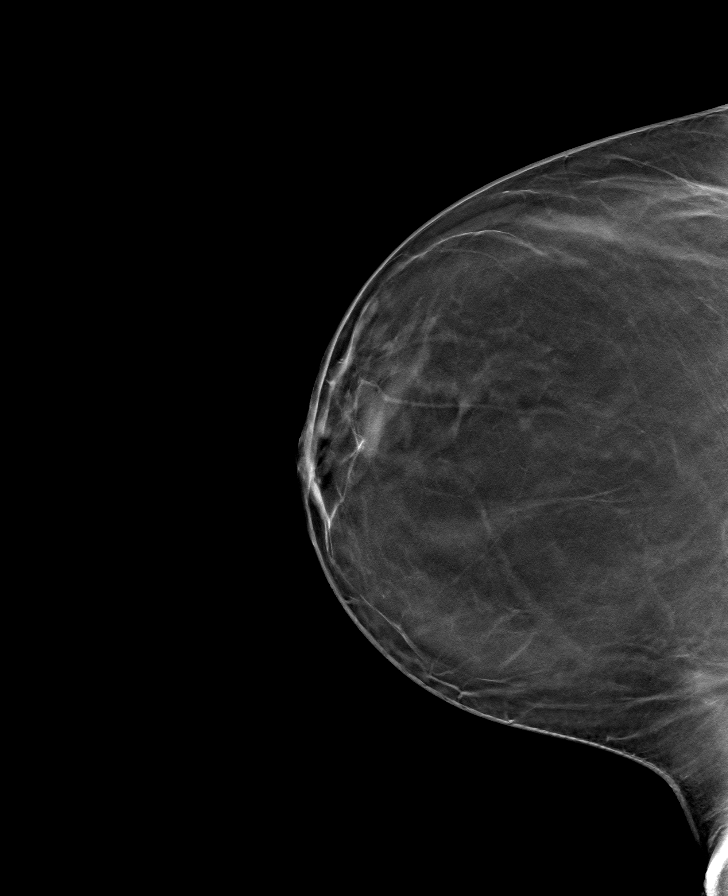

[L MLO tomo · tomo slice 49/97.0]
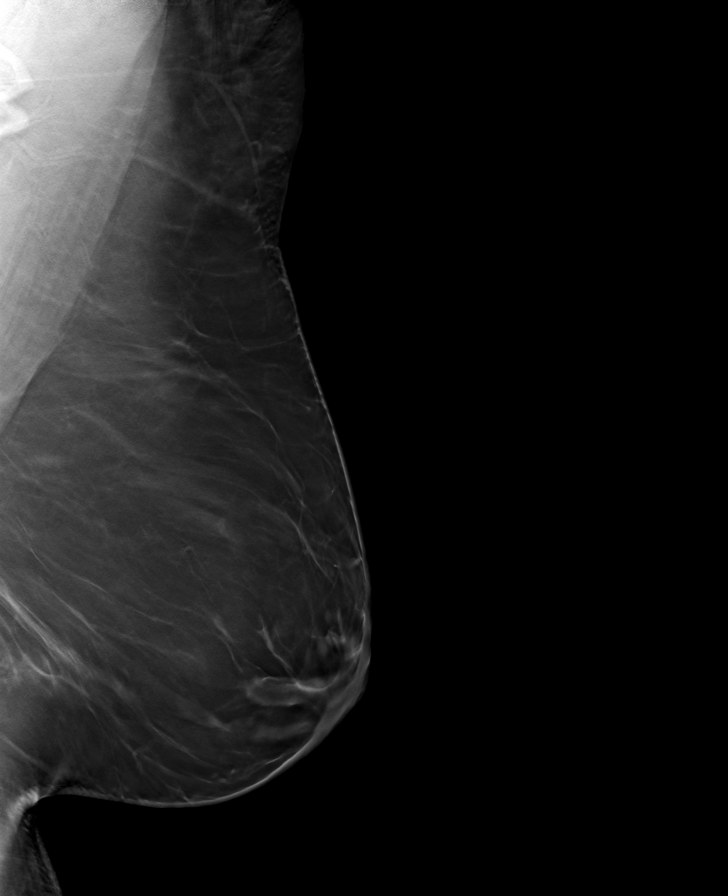

[R MLO tomo · tomo slice 44/87.0]
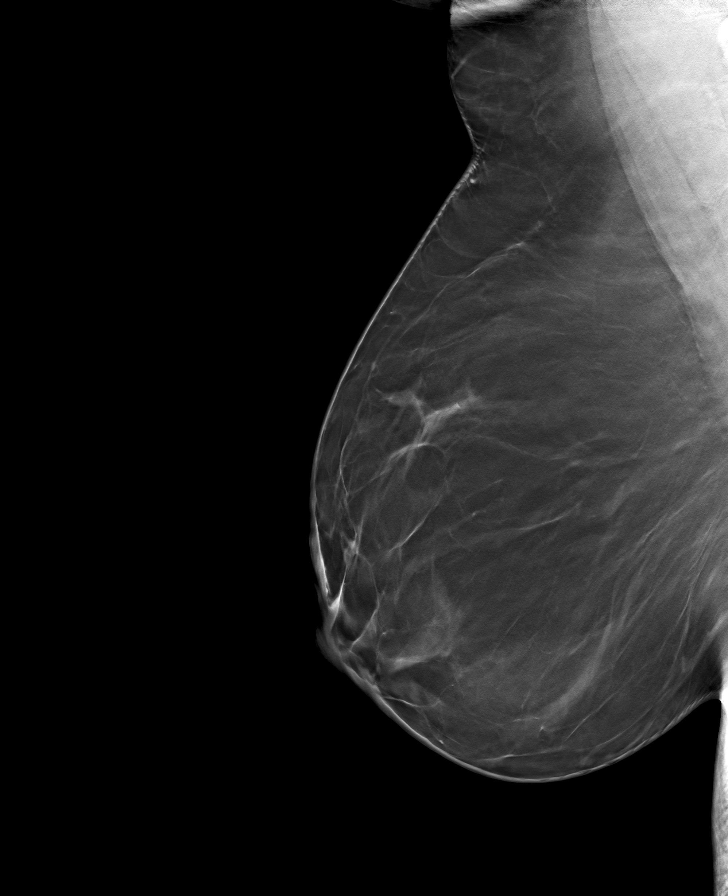

[L CC tomo · tomo slice 37/74.0]
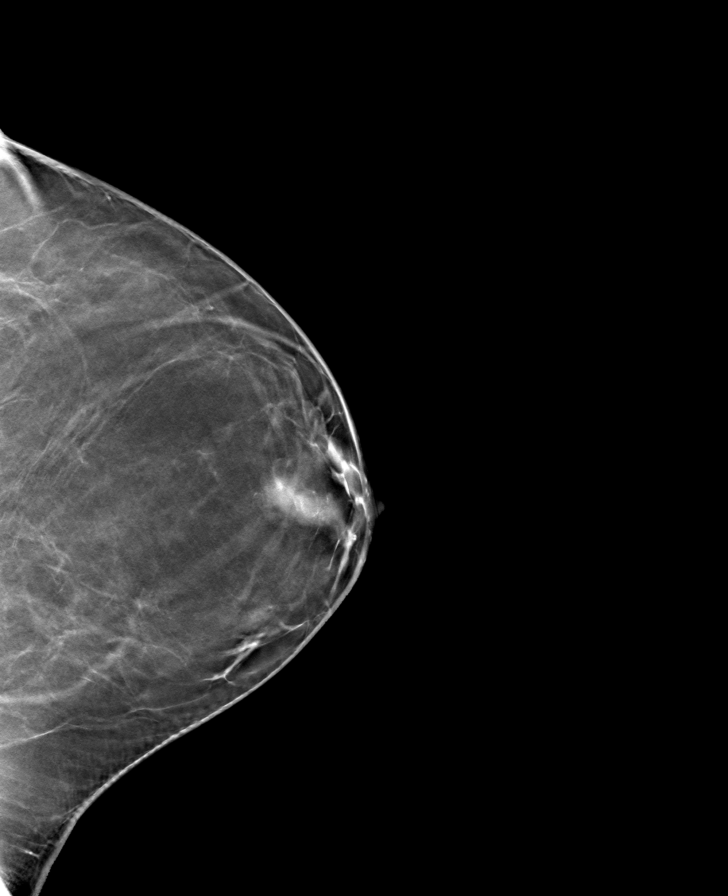

[8 of 24 positions shown; findings below may reference images not displayed]

ACR Breast Density Category b: There are scattered areas of
fibroglandular density.
FINDINGS: There are no findings suspicious for malignancy.
IMPRESSION: No mammographic evidence of malignancy. A result letter of this
screening mammogram will be mailed directly to the patient.

RECOMMENDATION:
Screening mammogram in one year. (Code:51-O-LD2)

BI-RADS CATEGORY  1: Negative.

## 2023-05-30 ENCOUNTER — Telehealth: Payer: Self-pay | Admitting: Family Medicine

## 2023-05-30 DIAGNOSIS — E559 Vitamin D deficiency, unspecified: Secondary | ICD-10-CM

## 2023-05-30 DIAGNOSIS — Z13220 Encounter for screening for lipoid disorders: Secondary | ICD-10-CM

## 2023-05-30 DIAGNOSIS — Z0000 Encounter for general adult medical examination without abnormal findings: Secondary | ICD-10-CM

## 2023-05-30 NOTE — Telephone Encounter (Signed)
Patient request labs prior to CPE. Please order. Would like to add vitamin D to the labs  Preferred lab - [facility]

## 2023-06-01 NOTE — Telephone Encounter (Signed)
Orders entered

## 2023-06-12 LAB — OB RESULTS CONSOLE RUBELLA ANTIBODY, IGM: Rubella: IMMUNE

## 2023-06-12 LAB — OB RESULTS CONSOLE HIV ANTIBODY (ROUTINE TESTING): HIV: NONREACTIVE

## 2023-06-12 LAB — OB RESULTS CONSOLE GC/CHLAMYDIA
Chlamydia: NEGATIVE
Neisseria Gonorrhea: NEGATIVE

## 2023-06-12 LAB — OB RESULTS CONSOLE HEPATITIS B SURFACE ANTIGEN: Hepatitis B Surface Ag: NEGATIVE

## 2023-06-12 LAB — OB RESULTS CONSOLE RPR: RPR: NONREACTIVE

## 2023-06-30 LAB — CBC WITH DIFFERENTIAL/PLATELET
Basophils Relative: 0.5 %
Eosinophils Absolute: 133 cells/uL (ref 15–500)
Eosinophils Relative: 1.6 %
MCH: 26.4 pg — ABNORMAL LOW (ref 27.0–33.0)
MCHC: 31.7 g/dL — ABNORMAL LOW (ref 32.0–36.0)
MCV: 83.1 fL (ref 80.0–100.0)
Monocytes Relative: 6.4 %
Platelets: 223 10*3/uL (ref 140–400)
WBC: 8.3 10*3/uL (ref 3.8–10.8)

## 2023-07-01 LAB — CBC WITH DIFFERENTIAL/PLATELET
Absolute Monocytes: 531 cells/uL (ref 200–950)
Basophils Absolute: 42 cells/uL (ref 0–200)
HCT: 41.9 % (ref 35.0–45.0)
Hemoglobin: 13.3 g/dL (ref 11.7–15.5)
Lymphs Abs: 2864 cells/uL (ref 850–3900)
MPV: 12.9 fL — ABNORMAL HIGH (ref 7.5–12.5)
Neutro Abs: 4731 cells/uL (ref 1500–7800)
Neutrophils Relative %: 57 %
RBC: 5.04 10*6/uL (ref 3.80–5.10)
RDW: 13.5 % (ref 11.0–15.0)
Total Lymphocyte: 34.5 %

## 2023-07-01 LAB — COMPLETE METABOLIC PANEL WITH GFR
AG Ratio: 1.4 (calc) (ref 1.0–2.5)
ALT: 11 U/L (ref 6–29)
AST: 11 U/L (ref 10–30)
Albumin: 4 g/dL (ref 3.6–5.1)
Alkaline phosphatase (APISO): 55 U/L (ref 31–125)
BUN: 13 mg/dL (ref 7–25)
CO2: 20 mmol/L (ref 20–32)
Calcium: 9.2 mg/dL (ref 8.6–10.2)
Chloride: 105 mmol/L (ref 98–110)
Creat: 0.62 mg/dL (ref 0.50–0.99)
Globulin: 2.9 g/dL (calc) (ref 1.9–3.7)
Glucose, Bld: 87 mg/dL (ref 65–99)
Potassium: 4.4 mmol/L (ref 3.5–5.3)
Sodium: 136 mmol/L (ref 135–146)
Total Bilirubin: 0.3 mg/dL (ref 0.2–1.2)
Total Protein: 6.9 g/dL (ref 6.1–8.1)
eGFR: 115 mL/min/{1.73_m2} (ref >=60)

## 2023-07-01 LAB — VITAMIN D 25 HYDROXY (VIT D DEFICIENCY, FRACTURES): Vit D, 25-Hydroxy: 47 ng/mL (ref 30–100)

## 2023-07-01 LAB — LIPID PANEL W/REFLEX DIRECT LDL
Cholesterol: 198 mg/dL (ref <200)
HDL: 74 mg/dL (ref >=50)
LDL Cholesterol (Calc): 98 mg/dL (calc)
Non-HDL Cholesterol (Calc): 124 mg/dL (calc) (ref <130)
Total CHOL/HDL Ratio: 2.7 (calc) (ref <5.0)
Triglycerides: 154 mg/dL — ABNORMAL HIGH (ref <150)

## 2023-07-03 ENCOUNTER — Encounter: Payer: Self-pay | Admitting: Family Medicine

## 2023-07-03 ENCOUNTER — Ambulatory Visit (INDEPENDENT_AMBULATORY_CARE_PROVIDER_SITE_OTHER): Payer: 59 | Admitting: Family Medicine

## 2023-07-03 VITALS — BP 121/84 | HR 103 | Ht 64.0 in | Wt 288.0 lb

## 2023-07-03 DIAGNOSIS — R0683 Snoring: Secondary | ICD-10-CM | POA: Diagnosis not present

## 2023-07-03 DIAGNOSIS — G4733 Obstructive sleep apnea (adult) (pediatric): Secondary | ICD-10-CM | POA: Diagnosis not present

## 2023-07-03 DIAGNOSIS — Z0000 Encounter for general adult medical examination without abnormal findings: Secondary | ICD-10-CM

## 2023-07-03 DIAGNOSIS — Z3490 Encounter for supervision of normal pregnancy, unspecified, unspecified trimester: Secondary | ICD-10-CM | POA: Insufficient documentation

## 2023-07-03 NOTE — Assessment & Plan Note (Signed)
Well adult Recent labs reviewed with patient.  Immunizations: UTD Screenings: UTD Anticipatory guidance/Risk factor reduction:  Recommendations per AVS.

## 2023-07-03 NOTE — Assessment & Plan Note (Signed)
Borderline OSA in the past.  Currently using CPAP.  Updated study ordered to update equipment.

## 2023-07-03 NOTE — Progress Notes (Signed)
Debra [name] - 42 y.o. female MRN [id number]  Date of birth: [birth year]  Subjective Chief Complaint  Patient presents with   Annual Exam    HPI Debra [name] is a 42 y.o. female here today for annual exam.   She reports that she is doing pretty well.  She would like to have an updated sleep study.  She has had this in the past and is currently using a CPAP.  She feels that she gets good benefit from her current CPAP but it is fairly old.  She also thinks settings may need updating.   She is currently about [redacted] weeks pregnant.   She is somewhat active.  Plans to work on diet and her TG were a little elevated.   Non-smoker.  Denies EtOH use at this time.   Review of Systems  Constitutional:  Negative for chills, fever, malaise/fatigue and weight loss.  HENT:  Negative for congestion, ear pain and sore throat.   Eyes:  Negative for blurred vision, double vision and pain.  Respiratory:  Negative for cough and shortness of breath.   Cardiovascular:  Negative for chest pain and palpitations.  Gastrointestinal:  Negative for abdominal pain, blood in stool, constipation, heartburn and nausea.  Genitourinary:  Negative for dysuria and urgency.  Musculoskeletal:  Negative for joint pain and myalgias.  Neurological:  Negative for dizziness and headaches.  Endo/Heme/Allergies:  Does not bruise/bleed easily.  Psychiatric/Behavioral:  Negative for depression. The patient is not nervous/anxious and does not have insomnia.       Allergies  Allergen Reactions   Dust Mite Extract Other (See Comments)    Past Medical History:  Diagnosis Date   Acne    Allergic rhinitis    Cold sore 11/02/2016   Depression    GERD (gastroesophageal reflux disease)    Hearing loss of both ears    per pt right > left,  does not wear hearing aids   IDA (iron deficiency anemia)    Incompetent cervix    Infertility, female    Mild obstructive sleep apnea    per pt had sleep study was  very mild osa,  did not meet critiria for insurance to pay but pt paid out of pocket since it helps due to dust mite allergy   Prior pregnancy with incompetent cervix in second trimester, antepartum 04/01/2022   03-31-2022 pre-mature ruptured membrane [redacted]wk gestation, nonviable   Uterine polyp     Past Surgical History:  Procedure Laterality Date   CERCLAGE LAPAROSCOPIC ABDOMINAL N/A 07/27/2022   Procedure: CERCLAGE LAPAROSCOPIC CERVICO- ABDOMINAL AND LYSIS OF ADHESIONS;  Surgeon: Fermin Schwab, MD;  Location: [facility];  Service: Gynecology;  Laterality: N/A;   DILATION AND CURETTAGE OF UTERUS N/A 04/01/2022   Procedure: DILATATION AND CURETTAGE;  Surgeon: Shea Evans, MD;  Location: [facility];  Service: Gynecology;  Laterality: N/A;   HYSTEROSCOPY N/A 07/27/2022   Procedure: HYSTEROSCOPY WITH ENDOMETRIAL BIOPSY;  Surgeon: Fermin Schwab, MD;  Location: [facility];  Service: Gynecology;  Laterality: N/A;    Social History   Socioeconomic History   Marital status: Married    Spouse name: Not on file   Number of children: Not on file   Years of education: Not on file   Highest education level: Not on file  Occupational History   Not on file  Tobacco Use   Smoking status: Never   Smokeless tobacco: Never  Vaping Use   Vaping status: Never Used  Substance and Sexual Activity   Alcohol use: Yes    Comment: occasional   Drug use: Never   Sexual activity: Yes    Partners: Male    Birth control/protection: Condom  Other Topics Concern   Not on file  Social History Narrative   Not on file   Social Determinants of Health   Financial Resource Strain: Not on file  Food Insecurity: Not on file  Transportation Needs: Not on file  Physical Activity: Not on file  Stress: Not on file  Social Connections: Not on file    Family History  Problem Relation Age of Onset   Breast cancer Mother    Allergic rhinitis Mother    Eczema Mother     Allergic rhinitis Father    Allergic rhinitis Brother     Health Maintenance  Topic Date Due   HPV VACCINES (2 - Risk 3-dose series) 01/16/2007   PAP SMEAR-Modifier  02/17/2020   COVID-19 Vaccine (6 - 2023-24 season) 07/19/2023 (Originally 09/30/2022)   Hepatitis C Screening  07/02/2024 (Originally 04/24/2000)   INFLUENZA VACCINE  07/20/2023   DTaP/Tdap/Td (3 - Td or Tdap) 06/01/2030   HIV Screening  Completed     ----------------------------------------------------------------------------------------------------------------------------------------------------------------------------------------------------------------- Physical Exam BP 121/84 (BP Location: Left Arm, Patient Position: Sitting, Cuff Size: Large)   Pulse (!) 103   Ht 5\' 4"  (1.626 m)   Wt 288 lb (130.6 kg)   LMP 10/07/2022 (Exact Date)   SpO2 98%   BMI 49.44 kg/m   Physical Exam Constitutional:      General: She is not in acute distress. HENT:     Head: Normocephalic and atraumatic.     Right Ear: Tympanic membrane and ear canal normal.     Left Ear: Tympanic membrane and ear canal normal.     Nose: Nose normal.  Eyes:     General: No scleral icterus.    Conjunctiva/sclera: Conjunctivae normal.  Neck:     Thyroid: No thyromegaly.  Cardiovascular:     Rate and Rhythm: Normal rate and regular rhythm.     Heart sounds: Normal heart sounds.  Pulmonary:     Effort: Pulmonary effort is normal.     Breath sounds: Normal breath sounds.  Abdominal:     General: Bowel sounds are normal. There is no distension.     Palpations: Abdomen is soft.     Tenderness: There is no abdominal tenderness. There is no guarding.  Musculoskeletal:        General: Normal range of motion.     Cervical back: Normal range of motion and neck supple.  Lymphadenopathy:     Cervical: No cervical adenopathy.  Skin:    General: Skin is warm and dry.     Findings: No rash.  Neurological:     General: No focal deficit present.      Mental Status: She is alert and oriented to person, place, and time.     Cranial Nerves: No cranial nerve deficit.     Coordination: Coordination normal.  Psychiatric:        Mood and Affect: Mood normal.        Behavior: Behavior normal.     ------------------------------------------------------------------------------------------------------------------------------------------------------------------------------------------------------------------- Assessment and Plan  Well adult exam Well adult Recent labs reviewed with patient.  Immunizations: UTD Screenings: UTD Anticipatory guidance/Risk factor reduction:  Recommendations per AVS.   Snoring Borderline OSA in the past.  Currently using CPAP.  Updated study ordered to update equipment.    No orders of the  defined types were placed in this encounter.   No follow-ups on file.    This visit occurred during the SARS-CoV-2 public health emergency.  Safety protocols were in place, including screening questions prior to the visit, additional usage of staff PPE, and extensive cleaning of exam room while observing appropriate contact time as indicated for disinfecting solutions.

## 2023-07-03 NOTE — Patient Instructions (Addendum)
Preventive Care 40-41 Years Old, Female Preventive care refers to lifestyle choices and visits with your health care provider that can promote health and wellness. Preventive care visits are also called wellness exams. What can I expect for my preventive care visit? Counseling Your health care provider may ask you questions about your: Medical history, including: Past medical problems. Family medical history. Pregnancy history. Current health, including: Menstrual cycle. Method of birth control. Emotional well-being. Home life and relationship well-being. Sexual activity and sexual health. Lifestyle, including: Alcohol, nicotine or tobacco, and drug use. Access to firearms. Diet, exercise, and sleep habits. Work and work environment. Sunscreen use. Safety issues such as seatbelt and bike helmet use. Physical exam Your health care provider will check your: Height and weight. These may be used to calculate your BMI (body mass index). BMI is a measurement that tells if you are at a healthy weight. Waist circumference. This measures the distance around your waistline. This measurement also tells if you are at a healthy weight and may help predict your risk of certain diseases, such as type 2 diabetes and high blood pressure. Heart rate and blood pressure. Body temperature. Skin for abnormal spots. What immunizations do I need?  Vaccines are usually given at various ages, according to a schedule. Your health care provider will recommend vaccines for you based on your age, medical history, and lifestyle or other factors, such as travel or where you work. What tests do I need? Screening Your health care provider may recommend screening tests for certain conditions. This may include: Lipid and cholesterol levels. Diabetes screening. This is done by checking your blood sugar (glucose) after you have not eaten for a while (fasting). Pelvic exam and Pap test. Hepatitis B test. Hepatitis C  test. HIV (human immunodeficiency virus) test. STI (sexually transmitted infection) testing, if you are at risk. Lung cancer screening. Colorectal cancer screening. Mammogram. Talk with your health care provider about when you should start having regular mammograms. This may depend on whether you have a family history of breast cancer. BRCA-related cancer screening. This may be done if you have a family history of breast, ovarian, tubal, or peritoneal cancers. Bone density scan. This is done to screen for osteoporosis. Talk with your health care provider about your test results, treatment options, and if necessary, the need for more tests. Follow these instructions at home: Eating and drinking  Eat a diet that includes fresh fruits and vegetables, whole grains, lean protein, and low-fat dairy products. Take vitamin and mineral supplements as recommended by your health care provider. Do not drink alcohol if: Your health care provider tells you not to drink. You are pregnant, may be pregnant, or are planning to become pregnant. If you drink alcohol: Limit how much you have to 0-1 drink a day. Know how much alcohol is in your drink. In the [place] one drink equals one 12 oz bottle of beer (355 mL), one 5 oz glass of wine (148 mL), or one 1 oz glass of hard liquor (44 mL). Lifestyle Brush your teeth every morning and night with fluoride toothpaste. Floss one time each day. Exercise for at least 30 minutes 5 or more days each week. Do not use any products that contain nicotine or tobacco. These products include cigarettes, chewing tobacco, and vaping devices, such as e-cigarettes. If you need help quitting, ask your health care provider. Do not use drugs. If you are sexually active, practice safe sex. Use a condom or other form of protection to   prevent STIs. If you do not wish to become pregnant, use a form of birth control. If you plan to become pregnant, see your health care provider for a  prepregnancy visit. Take aspirin only as told by your health care provider. Make sure that you understand how much to take and what form to take. Work with your health care provider to find out whether it is safe and beneficial for you to take aspirin daily. Find healthy ways to manage stress, such as: Meditation, yoga, or listening to music. Journaling. Talking to a trusted person. Spending time with friends and family. Minimize exposure to UV radiation to reduce your risk of skin cancer. Safety Always wear your seat belt while driving or riding in a vehicle. Do not drive: If you have been drinking alcohol. Do not ride with someone who has been drinking. When you are tired or distracted. While texting. If you have been using any mind-altering substances or drugs. Wear a helmet and other protective equipment during sports activities. If you have firearms in your house, make sure you follow all gun safety procedures. Seek help if you have been physically or sexually abused. What's next? Visit your health care provider once a year for an annual wellness visit. Ask your health care provider how often you should have your eyes and teeth checked. Stay up to date on all vaccines. This information is not intended to replace advice given to you by your health care provider. Make sure you discuss any questions you have with your health care provider. Document Revised: 06/02/2021 Document Reviewed: 06/02/2021 Elsevier Patient Education  2024 Elsevier Inc.  

## 2023-07-14 ENCOUNTER — Telehealth: Payer: Self-pay | Admitting: Family Medicine

## 2023-07-14 DIAGNOSIS — O99019 Anemia complicating pregnancy, unspecified trimester: Secondary | ICD-10-CM | POA: Insufficient documentation

## 2023-07-14 NOTE — Telephone Encounter (Signed)
Patient is requesting a sleep study no referral in system Thank you

## 2023-07-19 NOTE — Telephone Encounter (Signed)
Orders for HST were placed previously on 07/12/23

## 2023-08-04 ENCOUNTER — Encounter (HOSPITAL_COMMUNITY): Payer: Self-pay

## 2023-08-04 ENCOUNTER — Inpatient Hospital Stay (HOSPITAL_COMMUNITY)
Admission: AD | Admit: 2023-08-04 | Discharge: 2023-08-04 | Disposition: A | Discharge status: Home / Self Care | Payer: 59 | Attending: Obstetrics and Gynecology | Admitting: Obstetrics and Gynecology

## 2023-08-04 DIAGNOSIS — O368120 Decreased fetal movements, second trimester, not applicable or unspecified: Secondary | ICD-10-CM | POA: Diagnosis not present

## 2023-08-04 DIAGNOSIS — Z3A18 18 weeks gestation of pregnancy: Secondary | ICD-10-CM | POA: Diagnosis not present

## 2023-08-04 DIAGNOSIS — O09812 Supervision of pregnancy resulting from assisted reproductive technology, second trimester: Secondary | ICD-10-CM | POA: Diagnosis not present

## 2023-08-04 NOTE — MAU Provider Note (Signed)
Chief Complaint:  Decreased Fetal Movement   Event Date/Time   First Provider Initiated Contact with Patient 08/04/23 0432     HPI: Debra [name] is a 42 y.o. G3P1 at 75w6dwho presents to maternity admissions reporting decreased fetal movement since Wednesday.  Had felt some movement on left side of abdomen adjacent to umbilicus.  Concerned over this high risk pregnancy. . She denies LOF, vaginal bleeding.    Other This is a new problem. The current episode started yesterday. The problem has been unchanged. Pertinent negatives include no abdominal pain, chest pain, fever, headaches or vomiting. Nothing aggravates the symptoms. She has tried nothing for the symptoms.   RN Note: .Debra [name] is a 42 y.o. at [redacted]w[redacted]d here in MAU reporting DFM since Weds night. She had felt some kicks but has not felt any and is concerned. Denies VB or pain  Onset of complaint: Weds night Pain score: 0  Past Medical History: Past Medical History:  Diagnosis Date   Acne    Allergic rhinitis    Cold sore 11/02/2016   Depression    GERD (gastroesophageal reflux disease)    Hearing loss of both ears    per pt right > left,  does not wear hearing aids   IDA (iron deficiency anemia)    Incompetent cervix    Infertility, female    Mild obstructive sleep apnea    per pt had sleep study was very mild osa,  did not meet critiria for insurance to pay but pt paid out of pocket since it helps due to dust mite allergy   Prior pregnancy with incompetent cervix in second trimester, antepartum 04/01/2022   03-31-2022 pre-mature ruptured membrane [redacted]wk gestation, nonviable   Uterine polyp     Past obstetric history: OB History  Gravida Para Term Preterm AB Living  3 1          SAB IAB Ectopic Multiple Live Births        0      # Outcome Date GA Lbr Len/2nd Weight Sex Type Anes PTL Lv  3 Current           2 Para 04/01/22 [redacted]w[redacted]d 12:25 / 00:19 365 g F Vag-Breech None  FD  1 Gravida              Past Surgical History: Past Surgical History:  Procedure Laterality Date   CERCLAGE LAPAROSCOPIC ABDOMINAL N/A 07/27/2022   Procedure: CERCLAGE LAPAROSCOPIC CERVICO- ABDOMINAL AND LYSIS OF ADHESIONS;  Surgeon: Fermin Schwab, MD;  Location: [facility];  Service: Gynecology;  Laterality: N/A;   DILATION AND CURETTAGE OF UTERUS N/A 04/01/2022   Procedure: DILATATION AND CURETTAGE;  Surgeon: Shea Evans, MD;  Location: [facility];  Service: Gynecology;  Laterality: N/A;   HYSTEROSCOPY N/A 07/27/2022   Procedure: HYSTEROSCOPY WITH ENDOMETRIAL BIOPSY;  Surgeon: Fermin Schwab, MD;  Location: [facility];  Service: Gynecology;  Laterality: N/A;    Family History: Family History  Problem Relation Age of Onset   Breast cancer Mother    Allergic rhinitis Mother    Eczema Mother    Allergic rhinitis Father    Allergic rhinitis Brother     Social History: Social History   Tobacco Use   Smoking status: Never   Smokeless tobacco: Never  Vaping Use   Vaping status: Never Used  Substance Use Topics   Alcohol use: Yes    Comment: occasional   Drug use: Never  Allergies:  Allergies  Allergen Reactions   Dust Mite Extract Other (See Comments)    Meds:  Medications Prior to Admission  Medication Sig Dispense Refill Last Dose   aspirin EC (ASPIRIN LOW DOSE) 81 MG tablet       calcium citrate (CALCITRATE - DOSED IN MG ELEMENTAL CALCIUM) 950 (200 Ca) MG tablet Take 4,200 mg of elemental calcium by mouth daily.      Cholecalciferol (VITAMIN D3) 125 MCG (5000 UT) CAPS Take 1 capsule by mouth daily.      diphenhydramine-acetaminophen (TYLENOL PM) 25-500 MG TABS tablet Take 1 tablet by mouth at bedtime as needed.      Ergocalciferol (VITAMIN D2 PO) Take 1,000 Int'l Units/day by mouth daily.      lansoprazole (PREVACID) 15 MG capsule Take 15 mg by mouth daily at 12 noon.      Omega-3 Fatty Acids (OMEGA-3 FISH OIL PO) Take 5,000 mg by mouth daily.       OVER THE COUNTER MEDICATION Take 1 tablet by mouth daily. Lions' mane supplement (mushroom extract)  2000 mg      Prenatal MV-Min-Fe Fum-FA-DHA (PRENATAL+DHA PO)       Prenatal Vit-Fe Fumarate-FA (PRENATAL MULTIVITAMIN) TABS tablet Take 1 tablet by mouth daily at 12 noon.      PSYLLIUM FIBER PO Take 3,000 mg by mouth daily.      Riboflavin (B-2-400 PO) Take 1 tablet by mouth daily.      Vitamin A 3 MG (10000 UT) TABS Take 1 tablet by mouth daily.       I have reviewed patient's Past Medical Hx, Surgical Hx, Family Hx, Social Hx, medications and allergies.   ROS:  Review of Systems  Constitutional:  Negative for fever.  Cardiovascular:  Negative for chest pain.  Gastrointestinal:  Negative for abdominal pain and vomiting.  Neurological:  Negative for headaches.   Other systems negative  Physical Exam  Patient Vitals for the past 24 hrs:  BP Temp Pulse Resp SpO2 Height Weight  08/04/23 0402 (!) 148/94 -- -- -- -- -- --  08/04/23 0358 -- 98.5 F (36.9 C) 95 19 99 % 5\' 5"  (1.651 m) 132.8 kg   Constitutional: Well-developed, well-nourished female in no acute distress.  Cardiovascular: normal rate  Respiratory: normal effort GI: Abd soft, non-tender, gravid appropriate for gestational age.   No rebound or guarding. MS: Extremities nontender, no edema, normal ROM Neurologic: Alert and oriented x 4.  GU: Neg CVAT.   Labs: No results found for this or any previous visit (from the past 24 hour(s)).    Imaging:  Pt informed that the ultrasound is considered a limited OB ultrasound and is not intended to be a complete ultrasound exam.  Patient also informed that the ultrasound is not being completed with the intent of assessing for fetal or placental anomalies or any pelvic abnormalities.  Explained that the purpose of today's ultrasound is to assess for presentation, BPP and amniotic fluid volume.  Patient acknowledges the purpose of the exam and the limitations of the study.     Bedside US showed and active single fetus Good amniotic fluid Anterior placenta Fetus is very active but patient does not feel movements FHR 150s   MAU Course/MDM: I have reviewed the triage vital signs and the nursing notes.   Pertinent labs & imaging results that were available during my care of the patient were reviewed by me and considered in my medical decision making (see chart for details).  I have reviewed her medical records including past results, notes and treatments.    Treatments in MAU included Ultrasound  Based on where she was feeling movement, I suspect it was not fetal movement that she felt.  Discussed most feel the first flutters at about 20 wks and they are not usually felt as big kicks.  Has appt in office next week.    Assessment: Single IUP at [redacted]w[redacted]d IVF pregnancy Decreased perception of fetal movement Observed active fetus  Plan: Discharge home DIscussed monitoring movements Follow up in Office for prenatal visits and recheck Encouraged to return if she develops worsening of symptoms, increase in pain, fever, or other concerning symptoms.   Pt stable at time of discharge.  Wynelle Bourgeois CNM, MSN Certified Nurse-Midwife 08/04/2023 4:32 AM

## 2023-08-04 NOTE — Progress Notes (Signed)
Written and verbal d/c instructions given and understanding voiced. 

## 2023-08-04 NOTE — MAU Note (Signed)
.  Debra [name] is a 42 y.o. at [redacted]w[redacted]d here in MAU reporting DFM since Weds night. She had felt some kicks but has not felt any and is concerned. Denies VB or pain  Onset of complaint: Weds night Pain score: 0 Vitals:   08/04/23 0358 08/04/23 0402  BP:  (!) 148/94  Pulse: 95   Resp: 19   Temp: 98.5 F (36.9 C)   SpO2: 99%      NWG:NFAOZH to hear in Triage. Will go to a room where the pt can be flatter Lab orders placed from triage:  none

## 2023-08-04 NOTE — MAU Note (Signed)
Wynelle Bourgeois CNM in to see pt and do bedside u/s. Baby very active and pt reassured.

## 2023-08-04 NOTE — Progress Notes (Signed)
Written and verbal d/c instructions given and understanding voiced. Pt states she does not have elevated b/ps but thinks her pressure is up here as she does not have good memories of the hospital due to past fetal loss

## 2023-08-09 ENCOUNTER — Ambulatory Visit (HOSPITAL_BASED_OUTPATIENT_CLINIC_OR_DEPARTMENT_OTHER): Payer: 59 | Attending: Family Medicine | Admitting: Internal Medicine

## 2023-08-09 VITALS — Ht 65.0 in | Wt 288.0 lb

## 2023-08-09 DIAGNOSIS — G4733 Obstructive sleep apnea (adult) (pediatric): Secondary | ICD-10-CM | POA: Diagnosis not present

## 2023-08-10 NOTE — Telephone Encounter (Signed)
Appointment for Home sleep study was scheduled for 08/09/2023 at 3:30pm with Jetty Duhamel.

## 2023-08-19 DIAGNOSIS — G4733 Obstructive sleep apnea (adult) (pediatric): Secondary | ICD-10-CM

## 2023-08-19 NOTE — Procedures (Signed)
     Patient Name: Debra [name], Debra [name] Date: 08/09/2023 Gender: Female D.O.B: 1981-10-17 Age (years): 19 Referring Provider: Elmarie Shiley MD Height (inches): 65 Interpreting Physician: Jetty Duhamel MD, ABSM Weight (lbs): 288 RPSGT: [place] BMI: 48 MRN: [id number] Neck Size: <br>  CLINICAL INFORMATION Sleep Study Type: HST Indication for sleep study: OSA Epworth Sleepiness Score: 6  SLEEP STUDY TECHNIQUE A multi-channel overnight portable sleep study was performed. The channels recorded were: nasal airflow, thoracic respiratory movement, and oxygen saturation with a pulse oximetry. Snoring was also monitored.  MEDICATIONS Patient self administered medications include: none reported.  SLEEP ARCHITECTURE Patient was studied for 371.9 minutes. The sleep efficiency was 100.0 % and the patient was supine for 0%. The arousal index was 0.0 per hour.  RESPIRATORY PARAMETERS The overall AHI was 5.3 per hour, with a central apnea index of 0 per hour. The oxygen nadir was 90% during sleep.  CARDIAC DATA Mean heart rate during sleep was 81.8 bpm.  IMPRESSIONS - Mild obstructive sleep apnea occurred during this study (AHI = 5.3/h). - The patient had minimal or no oxygen desaturation during the study (Min O2 = 90%) - Patient snored.  DIAGNOSIS - Obstructive Sleep Apnea (G47.33)  RECOMMENDATIONS - Treatment for minimal OSA is guided by symptoms and co-morbidity. Conservative measures may include observation, weight loss, sleep position off back or chin strap. Other measures would be based on clinical judgment. - Be careful with alcohol, sedatives and other CNS depressants that may worsen sleep apnea and disrupt normal sleep architecture. - Sleep hygiene should be reviewed to assess factors that may improve sleep quality. - Weight management and regular exercise should be initiated or continued.  [Electronically signed] 08/19/2023 10:58 AM  Jetty Duhamel MD,  ABSM Diplomate, American Board of Sleep Medicine NPI: [id number]                        Jetty Duhamel Diplomate, American Board of Sleep Medicine  ELECTRONICALLY SIGNED ON:  08/19/2023, 10:53 AM [facility] PH: [phone]   FX: [phone] ACCREDITED BY THE AMERICAN ACADEMY OF SLEEP MEDICINE

## 2023-08-23 NOTE — Therapy (Signed)
OUTPATIENT PHYSICAL THERAPY THORACOLUMBAR EVALUATION   Patient Name: Debra [name] MRN: [id number] DOB:05/05/1981, 42 y.o., female Today's Date: 08/24/2023  END OF SESSION:  PT End of Session - 08/24/23 0856     Visit Number 1    Date for PT Re-Evaluation 11/16/23    Authorization Type UHC [id number]    PT Start Time 0850    PT Stop Time 0935    PT Time Calculation (min) 45 min    Activity Tolerance Patient tolerated treatment well    Behavior During Therapy [facility] for tasks assessed/performed             Past Medical History:  Diagnosis Date   Acne    Allergic rhinitis    Cold sore 11/02/2016   Depression    GERD (gastroesophageal reflux disease)    Hearing loss of both ears    per pt right > left,  does not wear hearing aids   IDA (iron deficiency anemia)    Incompetent cervix    Infertility, female    Mild obstructive sleep apnea    per pt had sleep study was very mild osa,  did not meet critiria for insurance to pay but pt paid out of pocket since it helps due to dust mite allergy   Prior pregnancy with incompetent cervix in second trimester, antepartum 04/01/2022   03-31-2022 pre-mature ruptured membrane [redacted]wk gestation, nonviable   Uterine polyp    Past Surgical History:  Procedure Laterality Date   CERCLAGE LAPAROSCOPIC ABDOMINAL N/A 07/27/2022   Procedure: CERCLAGE LAPAROSCOPIC CERVICO- ABDOMINAL AND LYSIS OF ADHESIONS;  Surgeon: Fermin Schwab, MD;  Location: [facility];  Service: Gynecology;  Laterality: N/A;   DILATION AND CURETTAGE OF UTERUS N/A 04/01/2022   Procedure: DILATATION AND CURETTAGE;  Surgeon: Shea Evans, MD;  Location: [facility];  Service: Gynecology;  Laterality: N/A;   HYSTEROSCOPY N/A 07/27/2022   Procedure: HYSTEROSCOPY WITH ENDOMETRIAL BIOPSY;  Surgeon: Fermin Schwab, MD;  Location: [facility];  Service: Gynecology;  Laterality: N/A;   Patient Active Problem List   Diagnosis Date Noted    Pregnancy 07/03/2023   Pharyngitis 09/18/2022   Well adult exam 06/08/2022   Preterm delivery 04/01/2022   Incompetent cervix in pregnancy, antepartum, second trimester 04/01/2022   Pregnancy resulting from in vitro fertilization in second trimester 03/31/2022   Premature rupture of membranes in second trimester 03/31/2022   Allergic conjunctivitis of both eyes 12/16/2020   Allergic conjunctivitis 09/29/2020   Insomnia 07/18/2018   Elevated ALT measurement 07/18/2018   Snoring 01/03/2017   Chronic low back pain without sciatica 01/03/2017   Vitamin D deficiency 11/02/2016   Morbid obesity with BMI of 40.0-44.9, adult (HCC) 11/02/2016   Dermatographia 11/02/2016   Cold sore 11/02/2016   Perennial allergic rhinitis 11/02/2016   Acne 11/02/2016   Depressive disorder 11/25/2014   Herpes simplex 12/02/2013   Esophageal reflux 12/02/2013    PCP: Everrett Coombe, DO   REFERRING PROVIDER: Olivia Mackie, MD   REFERRING DIAG: M54.50 (ICD-10-CM) - Low back pain, unspecified   Rationale for Evaluation and Treatment: Rehabilitation  THERAPY DIAG:  Other low back pain  Pain in right hip  Cramp and spasm  ONSET DATE: ~ 1 month  SUBJECTIVE:  SUBJECTIVE STATEMENT: My glutes on R are very tight and it radiates up and gives me back pain, I used to take muscle relaxors but can't because I'm pregnant.  I've done dry needling and PT before and its really helpful.  More painful in the morning because I'm a side sleeper.  It prevents me from sleeping not the back part but the hip.  She is [redacted] weeks pregnant, due Jun 11 but mostly likely going to have C-section around Christmas.  Said MD ok with dry needling in glutes, not spine.   PERTINENT HISTORY:  [redacted] weeks pregnant, h/o anemia, leukocytosis, LBP  PAIN:   Are you having pain? Yes: NPRS scale: 3-4/10 Pain location: R hip/glutes  Pain description: consistent dull pain in hip, acute sharp pain in back Aggravating factors: 7-8/10 at worst, laying on side Relieving factors: heating pad, massage, PT  PRECAUTIONS: Other: pregnant, no DN in lumbar/sacral region  RED FLAGS: None   WEIGHT BEARING RESTRICTIONS: No  FALLS:  Has patient fallen in last 6 months? No  LIVING ENVIRONMENT: Lives with: lives with their spouse Lives in: House/apartment Stairs: Yes: Internal: 14 steps; on right going up and on left going up and External: 1 steps; none Has following equipment at home: None  OCCUPATION: full time at [employer], but works at home most of the time.   PLOF: Independent  PATIENT GOALS: "lesses the pain, strengthen the muscles, stretch things out so not as painful"  NEXT MD VISIT: 08/25/2023  OBJECTIVE:   DIAGNOSTIC FINDINGS:  No relevant imaging  PATIENT SURVEYS:  Modified Oswestry 21/50   COGNITION: Overall cognitive status: Within functional limits for tasks assessed     SENSATION: WFL  MUSCLE LENGTH: Hamstrings: Right 90 deg; Left 90 deg  POSTURE: No Significant postural limitations  PALPATION: Tenderness L4/L5, bil QL, glutes and piriformis, SIJ, greater trochanters, Right worse than left.   LUMBAR ROM:   AROM eval  Flexion   Extension   Right lateral flexion   Left lateral flexion   Right rotation   Left rotation    (Blank rows = not tested)  LOWER EXTREMITY ROM:   good hip mobility bilaterally for both hip internal and external rotation.    LOWER EXTREMITY MMT:    MMT Right eval Left eval  Hip flexion 4+ 4+  Hip extension    Hip abduction 5 (in SL) 5 (in SL)  Hip adduction 5 5  Knee flexion 5 5  Knee extension 5 5  Ankle dorsiflexion 5 5  Ankle plantarflexion     (Blank rows = not tested)  LUMBAR SPECIAL TESTS:  Straight leg raise test: Negative and FABER test: Negative  FUNCTIONAL  TESTS:    GAIT: Distance walked: 35' Assistive device utilized: None Level of assistance: Complete Independence Comments: no significant deviation or device  TODAY'S TREATMENT:  DATE:   08/24/2023 EVAL Self Care: Findings, POC, education on anatomy, role of hormones (relaxin) on joints of pelvis (specifically SIJ) and relationship with SIJ/low back and hip pain, recommendations to discuss TENS with OB/GYN, need for stabilization as well as gentle stretching and TrDN.    Also recommended pregnancy belt to help support abdomen and decrease pressure on pelvis.      PATIENT EDUCATION:  Education details: see self care Person educated: Patient Education method: Explanation Education comprehension: verbalized understanding  HOME EXERCISE PROGRAM: TBD  ASSESSMENT:  CLINICAL IMPRESSION: Debra [name]  is a 42 y.o. female who was seen today for physical therapy evaluation and treatment for low back pain.    Patient presents primarily with R hip/SIJ/low back pain and is [redacted] weeks pregnant.  Overall she demonstrates good strength and flexibility, discussed roll of pregnancy placing pressure on pelvic girdle while ligaments are loosening in preparation for childbirth, causing more pain, as well as more pressure on hips due to sleeping on side and difficulty repositioning during sleep.  While she reports feeling a lot of tightness in hips Ober's was negative bilaterally and she demonstrated good hip mobility, but was very tender throughout all ligamentous/tendonous connections to pelvis and greater trochanters.  She has had good response to TrDN in past and reports Ob/Gyn has verbally ok'd TrDN in glutes only, to avoid lumbar and sacral spine.  We discussed TENS as well as another method to manage pain, she will discuss with her Ob/Gyn tommorow.  Debra [place]  [name] will benefit from skilled physical therapy to decrease/manage hip/SIJ/low back pain during pregnancy, improve core strength to stabilize pelvic girdle, and improve QOL.    OBJECTIVE IMPAIRMENTS: decreased activity tolerance, increased fascial restrictions, increased muscle spasms, and pain.   ACTIVITY LIMITATIONS: carrying, lifting, bending, sleeping, bed mobility, and locomotion level  PARTICIPATION LIMITATIONS: meal prep, cleaning, laundry, shopping, and community activity  PERSONAL FACTORS: Past/current experiences and 1-2 comorbidities: history of LBP, pregnancy, obesity   are also affecting patient's functional outcome.   REHAB POTENTIAL: Good  CLINICAL DECISION MAKING: Evolving/moderate complexity  EVALUATION COMPLEXITY: Moderate   GOALS: Goals reviewed with patient? Yes  SHORT TERM GOALS: Target date: 09/07/2023   Patient will be independent with initial HEP.  Baseline:  Goal status: INITIAL   LONG TERM GOALS: Target date: 11/16/2023   Patient will be independent with advanced/ongoing HEP to improve outcomes and carryover.  Baseline:  Goal status: INITIAL  2.  Patient will report 50% improvement in low back pain/R hip pain to improve QOL.  Baseline:  Goal status: INITIAL  3.  Patient will demonstrate full pain free lumbar ROM to perform ADLs.   Baseline:  Goal status: INITIAL  4.  Patient will report at least 6 points improvement on modified Oswestry to demonstrate improved functional ability.  Baseline: 21/50 Goal status: INITIAL   5. Patient will report 50% improvement in sleep quality  Baseline: increased pain at night preventing sleep Goal status: INITIAL   PLAN:  PT FREQUENCY: 2x/week for 6-8 weeks decreasing to 1x per week when appropriate for 12 weeks total  PT DURATION: 12 weeks  PLANNED INTERVENTIONS: Therapeutic exercises, Therapeutic activity, Neuromuscular re-education, Balance training, Gait training, Patient/Family education, Self  Care, Joint mobilization, Dry Needling, Electrical stimulation, Spinal mobilization, Cryotherapy, Moist heat, Manual therapy, and Re-evaluation.  PLAN FOR NEXT SESSION: core stabilization exercises, can lie on back for short periods but try in semi-reclined, manual therapy, TrDN to glutes/TFL only, no needling in spine region.  Check lumbar AROM.    Jena Gauss, PT, DPT  08/24/2023, 10:07 AM

## 2023-08-24 ENCOUNTER — Ambulatory Visit: Payer: 59 | Attending: Obstetrics and Gynecology | Admitting: Physical Therapy

## 2023-08-24 ENCOUNTER — Encounter: Payer: Self-pay | Admitting: Physical Therapy

## 2023-08-24 DIAGNOSIS — R252 Cramp and spasm: Secondary | ICD-10-CM | POA: Diagnosis present

## 2023-08-24 DIAGNOSIS — M25551 Pain in right hip: Secondary | ICD-10-CM | POA: Diagnosis present

## 2023-08-24 DIAGNOSIS — M5459 Other low back pain: Secondary | ICD-10-CM | POA: Diagnosis present

## 2023-08-25 ENCOUNTER — Telehealth: Payer: Self-pay

## 2023-08-25 NOTE — Telephone Encounter (Signed)
Patient called office concerning her sleep study results , patient states that she'd like to receive a call if possible, thanks.

## 2023-08-25 NOTE — Telephone Encounter (Signed)
Patient has 2 questions- 1-Patient questioning how many incidents per hour she had on sleep study  [place] insurance will pay for cpap with more that 15 per hour and in some incidents 7-15 per hour.  2-She is wanting to know if she can get an order for CPAP sent anyway to [facility] on [address] even if she has to pay out of pocket for the machine.  States she uses a CPAP currently and when she does not have it she sleeps poorly.  Patient contact # [phone].

## 2023-08-25 NOTE — Telephone Encounter (Signed)
Please let patient know that sleep study shows mild OSA.  Not at a level where she would need CPAP.  Modification of sleep position and weight loss are recommended initially.  It looks like she is transferring to a new PCP in a couple of weeks.  She can discuss any further recommendations with them as well at that visit.

## 2023-08-28 ENCOUNTER — Encounter: Payer: Self-pay | Admitting: Physical Therapy

## 2023-08-28 ENCOUNTER — Ambulatory Visit: Payer: 59 | Admitting: Physical Therapy

## 2023-08-28 DIAGNOSIS — R252 Cramp and spasm: Secondary | ICD-10-CM

## 2023-08-28 DIAGNOSIS — M5459 Other low back pain: Secondary | ICD-10-CM

## 2023-08-28 DIAGNOSIS — M25551 Pain in right hip: Secondary | ICD-10-CM

## 2023-08-28 NOTE — Telephone Encounter (Signed)
Patient informed. 

## 2023-08-28 NOTE — Therapy (Signed)
OUTPATIENT PHYSICAL THERAPY TREATMENT   Patient Name: Debra [name] MRN: [id number] DOB:October 23, 1981, 42 y.o., female Today's Date: 08/28/2023  END OF SESSION:  PT End of Session - 08/28/23 0804     Visit Number 2    Date for PT Re-Evaluation 11/16/23    Authorization Type UHC [id number]    PT Start Time 0804    PT Stop Time 0850    PT Time Calculation (min) 46 min    Activity Tolerance Patient tolerated treatment well    Behavior During Therapy [facility] for tasks assessed/performed             Past Medical History:  Diagnosis Date   Acne    Allergic rhinitis    Cold sore 11/02/2016   Depression    GERD (gastroesophageal reflux disease)    Hearing loss of both ears    per pt right > left,  does not wear hearing aids   IDA (iron deficiency anemia)    Incompetent cervix    Infertility, female    Mild obstructive sleep apnea    per pt had sleep study was very mild osa,  did not meet critiria for insurance to pay but pt paid out of pocket since it helps due to dust mite allergy   Prior pregnancy with incompetent cervix in second trimester, antepartum 04/01/2022   03-31-2022 pre-mature ruptured membrane [redacted]wk gestation, nonviable   Uterine polyp    Past Surgical History:  Procedure Laterality Date   CERCLAGE LAPAROSCOPIC ABDOMINAL N/A 07/27/2022   Procedure: CERCLAGE LAPAROSCOPIC CERVICO- ABDOMINAL AND LYSIS OF ADHESIONS;  Surgeon: Fermin Schwab, MD;  Location: [facility];  Service: Gynecology;  Laterality: N/A;   DILATION AND CURETTAGE OF UTERUS N/A 04/01/2022   Procedure: DILATATION AND CURETTAGE;  Surgeon: Shea Evans, MD;  Location: [facility];  Service: Gynecology;  Laterality: N/A;   HYSTEROSCOPY N/A 07/27/2022   Procedure: HYSTEROSCOPY WITH ENDOMETRIAL BIOPSY;  Surgeon: Fermin Schwab, MD;  Location: [facility];  Service: Gynecology;  Laterality: N/A;   Patient Active Problem List   Diagnosis Date Noted   Pregnancy 07/03/2023    Pharyngitis 09/18/2022   Well adult exam 06/08/2022   Preterm delivery 04/01/2022   Incompetent cervix in pregnancy, antepartum, second trimester 04/01/2022   Pregnancy resulting from in vitro fertilization in second trimester 03/31/2022   Premature rupture of membranes in second trimester 03/31/2022   Allergic conjunctivitis of both eyes 12/16/2020   Allergic conjunctivitis 09/29/2020   Insomnia 07/18/2018   Elevated ALT measurement 07/18/2018   Snoring 01/03/2017   Chronic low back pain without sciatica 01/03/2017   Vitamin D deficiency 11/02/2016   Morbid obesity with BMI of 40.0-44.9, adult (HCC) 11/02/2016   Dermatographia 11/02/2016   Cold sore 11/02/2016   Perennial allergic rhinitis 11/02/2016   Acne 11/02/2016   Depressive disorder 11/25/2014   Herpes simplex 12/02/2013   Esophageal reflux 12/02/2013    PCP: Everrett Coombe, DO   REFERRING PROVIDER: Olivia Mackie, MD   REFERRING DIAG: M54.50 (ICD-10-CM) - Low back pain, unspecified   Rationale for Evaluation and Treatment: Rehabilitation  THERAPY DIAG:  Other low back pain  Pain in right hip  Cramp and spasm  ONSET DATE: ~ 1 month  SUBJECTIVE:  SUBJECTIVE STATEMENT: Patient reported pain was 6.5 when she woke up so laid on heating pad for 30 min before she got up.  OB ok'd using TENS on her back.    PERTINENT HISTORY:  [redacted] weeks pregnant, h/o anemia, leukocytosis, LBP  PAIN:  Are you having pain? Yes: NPRS scale: 6.5/10 Pain location:  low back, R hip/glutes  Pain description: consistent dull pain in hip, acute sharp pain in back Aggravating factors: 7-8/10 at worst, laying on side Relieving factors: heating pad, massage, PT  PRECAUTIONS: Other: pregnant, no DN in lumbar/sacral region  RED FLAGS: None   WEIGHT  BEARING RESTRICTIONS: No  FALLS:  Has patient fallen in last 6 months? No  LIVING ENVIRONMENT: Lives with: lives with their spouse Lives in: House/apartment Stairs: Yes: Internal: 14 steps; on right going up and on left going up and External: 1 steps; none Has following equipment at home: None  OCCUPATION: full time at [employer], but works at home most of the time.   PLOF: Independent  PATIENT GOALS: "lesses the pain, strengthen the muscles, stretch things out so not as painful"  NEXT MD VISIT: 08/25/2023  OBJECTIVE:   DIAGNOSTIC FINDINGS:  No relevant imaging  PATIENT SURVEYS:  Modified Oswestry 21/50   COGNITION: Overall cognitive status: Within functional limits for tasks assessed     SENSATION: WFL  MUSCLE LENGTH: Hamstrings: Right 90 deg; Left 90 deg  POSTURE: No Significant postural limitations  PALPATION: Tenderness L4/L5, bil QL, glutes and piriformis, SIJ, greater trochanters, Right worse than left.   LUMBAR ROM:   AROM eval  Flexion   Extension   Right lateral flexion   Left lateral flexion   Right rotation   Left rotation    (Blank rows = not tested)  LOWER EXTREMITY ROM:   good hip mobility bilaterally for both hip internal and external rotation.    LOWER EXTREMITY MMT:    MMT Right eval Left eval  Hip flexion 4+ 4+  Hip extension    Hip abduction 5 (in SL) 5 (in SL)  Hip adduction 5 5  Knee flexion 5 5  Knee extension 5 5  Ankle dorsiflexion 5 5  Ankle plantarflexion     (Blank rows = not tested)  LUMBAR SPECIAL TESTS:  Straight leg raise test: Negative and FABER test: Negative  FUNCTIONAL TESTS:    GAIT: Distance walked: 84' Assistive device utilized: None Level of assistance: Complete Independence Comments: no significant deviation or device  TODAY'S TREATMENT:                                                                                                                              DATE:   08/28/23 Therapeutic  Exercise: to improve strength and mobility.  Demo, verbal and tactile cues throughout for technique. Placed in reclined position for safety: Diaphragmatic Breathing for TrA contraction x 10 PPT with TrA contraction x 10  Bent knee fall outs alternating with TrA  x 10  PPT with pillow squeeze and TrA contraction x 10 Knee rocks Manual Therapy: to decrease muscle spasm and pain and improve mobility STM/TPR to bil glutes, skilled palpation and monitoring during dry needling. Trigger Point Dry-Needling  Treatment instructions: Expect mild to moderate muscle soreness. S/S of pneumothorax if dry needled over a lung field, and to seek immediate medical attention should they occur. Patient verbalized understanding of these instructions and education. Patient Consent Given: Yes Education handout provided: Yes Muscles treated: bil glute med and piriformis Electrical stimulation performed: No Parameters: N/A Treatment response/outcome: Twitch Response Elicited and Palpable Increase in Muscle Length  08/24/2023 EVAL Self Care: Findings, POC, education on anatomy, role of hormones (relaxin) on joints of pelvis (specifically SIJ) and relationship with SIJ/low back and hip pain, recommendations to discuss TENS with OB/GYN, need for stabilization as well as gentle stretching and TrDN.    Also recommended pregnancy belt to help support abdomen and decrease pressure on pelvis.      PATIENT EDUCATION:  Education details: initial HEP, TrDN Person educated: Patient Education method: Explanation, Demonstration, Verbal cues, and Handouts Education comprehension: verbalized understanding and returned demonstration  HOME EXERCISE PROGRAM: Access Code: [email or web address] URL: [email or web address] Date: 08/28/2023 Prepared by: Harrie Foreman  Exercises - Supine Diaphragmatic Breathing  - 1 x daily - 7 x weekly - 1 sets - 10 reps - Seated Diaphragmatic Breathing  - 1 x daily - 7 x weekly - 1 sets - 10  reps - Supine Posterior Pelvic Tilt  - 1 x daily - 7 x weekly - 1 sets - 10 reps - Seated Pelvic Tilt  - 1 x daily - 7 x weekly - 1 sets - 10 reps - Posterior Pelvic Tilt with Adduction Using Pillow in Hooklying  - 1 x daily - 7 x weekly - 1 sets - 10 reps - Bent Knee Fallouts  - 1 x daily - 7 x weekly - 1 sets - 10 reps - Supine Posterior Pelvic Tilt with Knee Rocks  - 1 x daily - 7 x weekly - 1 sets - 10 reps  ASSESSMENT:  CLINICAL IMPRESSION: Started lumbopelvic strengthening today, patient placed in reclined position due to pregnancy rather than supine, tolerated exercises well, also provided hand out with directions for sitting exercises as well, discussed working on diaphragmatic breathing and PPT for TrA contraction throughout the day in limited reps as exercise "snacks" to help tone TrA.   Other exercises just once a day or every other day if cause soreness at first.  Followed therex with manual therapy and TrDN to glutes in [place] to bil hips with good tolerance and reported immediate reduction in discomfort.  Debra [name] continues to demonstrate potential for improvement and would benefit from continued skilled therapy to address impairments.       OBJECTIVE IMPAIRMENTS: decreased activity tolerance, increased fascial restrictions, increased muscle spasms, and pain.   ACTIVITY LIMITATIONS: carrying, lifting, bending, sleeping, bed mobility, and locomotion level  PARTICIPATION LIMITATIONS: meal prep, cleaning, laundry, shopping, and community activity  PERSONAL FACTORS: Past/current experiences and 1-2 comorbidities: history of LBP, pregnancy, obesity   are also affecting patient's functional outcome.   REHAB POTENTIAL: Good  CLINICAL DECISION MAKING: Evolving/moderate complexity  EVALUATION COMPLEXITY: Moderate   GOALS: Goals reviewed with patient? Yes  SHORT TERM GOALS: Target date: 09/07/2023   Patient will be independent with initial HEP.  Baseline:   Given 08/28/23 Goal status: IN PROGRESS   LONG TERM GOALS: Target date: 11/16/2023  Patient will be independent with advanced/ongoing HEP to improve outcomes and carryover.  Baseline:   Goal status: IN PROGRESS  2.  Patient will report 50% improvement in low back pain/R hip pain to improve QOL.  Baseline:  Goal status: IN PROGRESS  3.  Patient will demonstrate full pain free lumbar ROM to perform ADLs.   Baseline:  Goal status: IN PROGRESS  4.  Patient will report at least 6 points improvement on modified Oswestry to demonstrate improved functional ability.  Baseline: 21/50 Goal status: IN PROGRESS   5. Patient will report 50% improvement in sleep quality  Baseline: increased pain at night preventing sleep Goal status: IN PROGRESS   PLAN:  PT FREQUENCY: 2x/week for 6-8 weeks decreasing to 1x per week when appropriate for 12 weeks total  PT DURATION: 12 weeks  PLANNED INTERVENTIONS: Therapeutic exercises, Therapeutic activity, Neuromuscular re-education, Balance training, Gait training, Patient/Family education, Self Care, Joint mobilization, Dry Needling, Electrical stimulation, Spinal mobilization, Cryotherapy, Moist heat, Manual therapy, and Re-evaluation.  PLAN FOR NEXT SESSION: core stabilization exercises, can lie on back for short periods but try in semi-reclined, manual therapy, TrDN to glutes/TFL only, no needling in spine region.   Check lumbar AROM.    Jena Gauss, PT, DPT  08/28/2023, 8:59 AM

## 2023-09-04 ENCOUNTER — Ambulatory Visit: Payer: 59 | Admitting: Physical Therapy

## 2023-09-04 ENCOUNTER — Encounter: Payer: Self-pay | Admitting: Physical Therapy

## 2023-09-04 DIAGNOSIS — M5459 Other low back pain: Secondary | ICD-10-CM | POA: Diagnosis not present

## 2023-09-04 DIAGNOSIS — M25551 Pain in right hip: Secondary | ICD-10-CM

## 2023-09-04 DIAGNOSIS — R252 Cramp and spasm: Secondary | ICD-10-CM

## 2023-09-04 NOTE — Therapy (Signed)
OUTPATIENT PHYSICAL THERAPY TREATMENT   Patient Name: Debra [name] MRN: [id number] DOB:1981/09/16, 42 y.o., female Today's Date: 09/04/2023  END OF SESSION:  PT End of Session - 09/04/23 0851     Visit Number 3    Date for PT Re-Evaluation 11/16/23    Authorization Type [id number]    PT Start Time 0847    PT Stop Time 0932    PT Time Calculation (min) 45 min    Activity Tolerance Patient tolerated treatment well    Behavior During Therapy [facility] for tasks assessed/performed             Past Medical History:  Diagnosis Date   Acne    Allergic rhinitis    Cold sore 11/02/2016   Depression    GERD (gastroesophageal reflux disease)    Hearing loss of both ears    per pt right > left,  does not wear hearing aids   IDA (iron deficiency anemia)    Incompetent cervix    Infertility, female    Mild obstructive sleep apnea    per pt had sleep study was very mild osa,  did not meet critiria for insurance to pay but pt paid out of pocket since it helps due to dust mite allergy   Prior pregnancy with incompetent cervix in second trimester, antepartum 04/01/2022   03-31-2022 pre-mature ruptured membrane [redacted]wk gestation, nonviable   Uterine polyp    Past Surgical History:  Procedure Laterality Date   CERCLAGE LAPAROSCOPIC ABDOMINAL N/A 07/27/2022   Procedure: CERCLAGE LAPAROSCOPIC CERVICO- ABDOMINAL AND LYSIS OF ADHESIONS;  Surgeon: Fermin Schwab, MD;  Location: [facility];  Service: Gynecology;  Laterality: N/A;   DILATION AND CURETTAGE OF UTERUS N/A 04/01/2022   Procedure: DILATATION AND CURETTAGE;  Surgeon: Shea Evans, MD;  Location: [facility];  Service: Gynecology;  Laterality: N/A;   HYSTEROSCOPY N/A 07/27/2022   Procedure: HYSTEROSCOPY WITH ENDOMETRIAL BIOPSY;  Surgeon: Fermin Schwab, MD;  Location: [facility];  Service: Gynecology;  Laterality: N/A;   Patient Active Problem List   Diagnosis Date Noted   Pregnancy 07/03/2023    Pharyngitis 09/18/2022   Well adult exam 06/08/2022   Preterm delivery 04/01/2022   Incompetent cervix in pregnancy, antepartum, second trimester 04/01/2022   Pregnancy resulting from in vitro fertilization in second trimester 03/31/2022   Premature rupture of membranes in second trimester 03/31/2022   Allergic conjunctivitis of both eyes 12/16/2020   Allergic conjunctivitis 09/29/2020   Insomnia 07/18/2018   Elevated ALT measurement 07/18/2018   Snoring 01/03/2017   Chronic low back pain without sciatica 01/03/2017   Vitamin D deficiency 11/02/2016   Morbid obesity with BMI of 40.0-44.9, adult (HCC) 11/02/2016   Dermatographia 11/02/2016   Cold sore 11/02/2016   Perennial allergic rhinitis 11/02/2016   Acne 11/02/2016   Depressive disorder 11/25/2014   Herpes simplex 12/02/2013   Esophageal reflux 12/02/2013    PCP: Everrett Coombe, DO   REFERRING PROVIDER: Olivia Mackie, MD   REFERRING DIAG: M54.50 (ICD-10-CM) - Low back pain, unspecified   Rationale for Evaluation and Treatment: Rehabilitation  THERAPY DIAG:  Other low back pain  Pain in right hip  Cramp and spasm  ONSET DATE: ~ 1 month  SUBJECTIVE:  SUBJECTIVE STATEMENT: Patient  reports mostly feeling pain below hip, back was not happy this weekend after cleaning house, took some tylenol which helped.   Fluctuates between 4-7.   Dry needling did help last time and exercises are helping too make things less painful.   PERTINENT HISTORY:  [redacted] weeks pregnant, h/o anemia, leukocytosis, LBP  PAIN:  Are you having pain? Yes: NPRS scale: 4.5/10 Pain location:  low back, R hip/glutes  Pain description: consistent dull pain in hip, acute sharp pain in back Aggravating factors: 7-8/10 at worst, laying on side Relieving factors:  heating pad, massage, PT  PRECAUTIONS: Other: pregnant, no DN in lumbar/sacral region  RED FLAGS: None   WEIGHT BEARING RESTRICTIONS: No  FALLS:  Has patient fallen in last 6 months? No  LIVING ENVIRONMENT: Lives with: lives with their spouse Lives in: House/apartment Stairs: Yes: Internal: 14 steps; on right going up and on left going up and External: 1 steps; none Has following equipment at home: None  OCCUPATION: full time at [employer], but works at home most of the time.   PLOF: Independent  PATIENT GOALS: "lesses the pain, strengthen the muscles, stretch things out so not as painful"  NEXT MD VISIT: 08/25/2023  OBJECTIVE:   DIAGNOSTIC FINDINGS:  No relevant imaging  PATIENT SURVEYS:  Modified Oswestry 21/50   COGNITION: Overall cognitive status: Within functional limits for tasks assessed     SENSATION: WFL  MUSCLE LENGTH: Hamstrings: Right 90 deg; Left 90 deg  POSTURE: No Significant postural limitations  PALPATION: Tenderness L4/L5, bil QL, glutes and piriformis, SIJ, greater trochanters, Right worse than left.   LUMBAR ROM:   AROM eval  Flexion   Extension   Right lateral flexion   Left lateral flexion   Right rotation   Left rotation    (Blank rows = not tested)  LOWER EXTREMITY ROM:   good hip mobility bilaterally for both hip internal and external rotation.    LOWER EXTREMITY MMT:    MMT Right eval Left eval  Hip flexion 4+ 4+  Hip extension    Hip abduction 5 (in SL) 5 (in SL)  Hip adduction 5 5  Knee flexion 5 5  Knee extension 5 5  Ankle dorsiflexion 5 5  Ankle plantarflexion     (Blank rows = not tested)  LUMBAR SPECIAL TESTS:  Straight leg raise test: Negative and FABER test: Negative  FUNCTIONAL TESTS:    GAIT: Distance walked: 39' Assistive device utilized: None Level of assistance: Complete Independence Comments: no significant deviation or device  TODAY'S TREATMENT:                                                                                                                               DATE:   09/04/23 Therapeutic Exercise: to improve strength and mobility.  Demo, verbal and tactile cues throughout for technique. Patient placed in reclined position rather than supine for safety: PPT x 10  with breathing x 15 : Bent knee fall outs x 10  PPT with pillow squeeze x 15 Manual Therapy: to decrease muscle spasm and pain and improve mobility STM/TPR to bil glute med and max, piriformis, skilled palpation and monitoring during dry needling. Trigger Point Dry-Needling  Treatment instructions: Expect mild to moderate muscle soreness. S/S of pneumothorax if dry needled over a lung field, and to seek immediate medical attention should they occur. Patient verbalized understanding of these instructions and education. Patient Consent Given: Yes Education handout provided: Yes Muscles treated: bil glute med and piriformis Electrical stimulation performed: No Parameters: N/A Treatment response/outcome: Twitch Response Elicited and Palpable Increase in Muscle Length  08/28/23 Therapeutic Exercise: to improve strength and mobility.  Demo, verbal and tactile cues throughout for technique. Placed in reclined position for safety: Diaphragmatic Breathing for TrA contraction x 10 PPT with TrA contraction x 10  Bent knee fall outs alternating with TrA x 10  PPT with pillow squeeze and TrA contraction x 10 Knee rocks Manual Therapy: to decrease muscle spasm and pain and improve mobility STM/TPR to bil glutes, skilled palpation and monitoring during dry needling. Trigger Point Dry-Needling  Treatment instructions: Expect mild to moderate muscle soreness. S/S of pneumothorax if dry needled over a lung field, and to seek immediate medical attention should they occur. Patient verbalized understanding of these instructions and education. Patient Consent Given: Yes Education handout provided: Yes Muscles treated: bil  glute med and piriformis Electrical stimulation performed: No Parameters: N/A Treatment response/outcome: Twitch Response Elicited and Palpable Increase in Muscle Length  08/24/2023 EVAL Self Care: Findings, POC, education on anatomy, role of hormones (relaxin) on joints of pelvis (specifically SIJ) and relationship with SIJ/low back and hip pain, recommendations to discuss TENS with OB/GYN, need for stabilization as well as gentle stretching and TrDN.    Also recommended pregnancy belt to help support abdomen and decrease pressure on pelvis.      PATIENT EDUCATION:  Education details: continue HEP Person educated: Patient Education method: Explanation Education comprehension: verbalized understanding  HOME EXERCISE PROGRAM: Access Code: [email or web address] URL: [email or web address] Date: 08/28/2023 Prepared by: Harrie Foreman  Exercises - Supine Diaphragmatic Breathing  - 1 x daily - 7 x weekly - 1 sets - 10 reps - Seated Diaphragmatic Breathing  - 1 x daily - 7 x weekly - 1 sets - 10 reps - Supine Posterior Pelvic Tilt  - 1 x daily - 7 x weekly - 1 sets - 10 reps - Seated Pelvic Tilt  - 1 x daily - 7 x weekly - 1 sets - 10 reps - Posterior Pelvic Tilt with Adduction Using Pillow in Hooklying  - 1 x daily - 7 x weekly - 1 sets - 10 reps - Bent Knee Fallouts  - 1 x daily - 7 x weekly - 1 sets - 10 reps - Supine Posterior Pelvic Tilt with Knee Rocks  - 1 x daily - 7 x weekly - 1 sets - 10 reps  ASSESSMENT:  CLINICAL IMPRESSION: Daijanae reports good response to initial session of manual therapy and TrDN, and good compliance with HEP, reporting improving lumbar mobility and decreasing pain, demonstrating improved activation of abdominals today, meeting STG #1.  Still had several trigger points in glutes, tolerated manual therapy well reporting decreased tightness following.   Calea [place] Frisch continues to demonstrate potential for improvement and would benefit from  continued skilled therapy to address impairments.       OBJECTIVE IMPAIRMENTS: decreased activity tolerance, increased fascial  restrictions, increased muscle spasms, and pain.   ACTIVITY LIMITATIONS: carrying, lifting, bending, sleeping, bed mobility, and locomotion level  PARTICIPATION LIMITATIONS: meal prep, cleaning, laundry, shopping, and community activity  PERSONAL FACTORS: Past/current experiences and 1-2 comorbidities: history of LBP, pregnancy, obesity   are also affecting patient's functional outcome.   REHAB POTENTIAL: Good  CLINICAL DECISION MAKING: Evolving/moderate complexity  EVALUATION COMPLEXITY: Moderate   GOALS: Goals reviewed with patient? Yes  SHORT TERM GOALS: Target date: 09/07/2023   Patient will be independent with initial HEP.  Baseline:  Given 08/28/23 Goal status: MET 09/04/23   LONG TERM GOALS: Target date: 11/16/2023   Patient will be independent with advanced/ongoing HEP to improve outcomes and carryover.  Baseline:   Goal status: IN PROGRESS  2.  Patient will report 50% improvement in low back pain/R hip pain to improve QOL.  Baseline:  Goal status: IN PROGRESS  3.  Patient will demonstrate full pain free lumbar ROM to perform ADLs.   Baseline:  Goal status: IN PROGRESS  4.  Patient will report at least 6 points improvement on modified Oswestry to demonstrate improved functional ability.  Baseline: 21/50 Goal status: IN PROGRESS   5. Patient will report 50% improvement in sleep quality  Baseline: increased pain at night preventing sleep Goal status: IN PROGRESS   PLAN:  PT FREQUENCY: 2x/week for 6-8 weeks decreasing to 1x per week when appropriate for 12 weeks total  PT DURATION: 12 weeks  PLANNED INTERVENTIONS: Therapeutic exercises, Therapeutic activity, Neuromuscular re-education, Balance training, Gait training, Patient/Family education, Self Care, Joint mobilization, Dry Needling, Electrical stimulation, Spinal mobilization,  Cryotherapy, Moist heat, Manual therapy, and Re-evaluation.  PLAN FOR NEXT SESSION: core stabilization exercises, can lie on back for short periods but try in semi-reclined, manual therapy, TrDN to glutes/TFL only, no needling in spine region.  Consider Ktape to abdomen also.     Jena Gauss, PT, DPT  09/04/2023, 9:54 AM

## 2023-09-07 ENCOUNTER — Encounter: Payer: Self-pay | Admitting: Physical Therapy

## 2023-09-07 ENCOUNTER — Ambulatory Visit: Payer: 59 | Admitting: Physical Therapy

## 2023-09-07 DIAGNOSIS — M5459 Other low back pain: Secondary | ICD-10-CM

## 2023-09-07 DIAGNOSIS — R252 Cramp and spasm: Secondary | ICD-10-CM

## 2023-09-07 DIAGNOSIS — M25551 Pain in right hip: Secondary | ICD-10-CM

## 2023-09-07 NOTE — Therapy (Signed)
OUTPATIENT PHYSICAL THERAPY TREATMENT   Patient Name: Debra [name] MRN: [id number] DOB:September 20, 1981, 42 y.o., female Today's Date: 09/07/2023  END OF SESSION:  PT End of Session - 09/07/23 1039     Visit Number 4    Date for PT Re-Evaluation 11/16/23    Authorization Type UHC [id number]    PT Start Time 1020    PT Stop Time 1102    PT Time Calculation (min) 42 min    Activity Tolerance Patient tolerated treatment well    Behavior During Therapy WFL for tasks assessed/performed             Past Medical History:  Diagnosis Date   Acne    Allergic rhinitis    Cold sore 11/02/2016   Depression    GERD (gastroesophageal reflux disease)    Hearing loss of both ears    per pt right > left,  does not wear hearing aids   IDA (iron deficiency anemia)    Incompetent cervix    Infertility, female    Mild obstructive sleep apnea    per pt had sleep study was very mild osa,  did not meet critiria for insurance to pay but pt paid out of pocket since it helps due to dust mite allergy   Prior pregnancy with incompetent cervix in second trimester, antepartum 04/01/2022   03-31-2022 pre-mature ruptured membrane [redacted]wk gestation, nonviable   Uterine polyp    Past Surgical History:  Procedure Laterality Date   CERCLAGE LAPAROSCOPIC ABDOMINAL N/A 07/27/2022   Procedure: CERCLAGE LAPAROSCOPIC CERVICO- ABDOMINAL AND LYSIS OF ADHESIONS;  Surgeon: Fermin Schwab, MD;  Location: [facility];  Service: Gynecology;  Laterality: N/A;   DILATION AND CURETTAGE OF UTERUS N/A 04/01/2022   Procedure: DILATATION AND CURETTAGE;  Surgeon: Shea Evans, MD;  Location: [facility];  Service: Gynecology;  Laterality: N/A;   HYSTEROSCOPY N/A 07/27/2022   Procedure: HYSTEROSCOPY WITH ENDOMETRIAL BIOPSY;  Surgeon: Fermin Schwab, MD;  Location: [facility];  Service: Gynecology;  Laterality: N/A;   Patient Active Problem List   Diagnosis Date Noted   Pregnancy 07/03/2023    Pharyngitis 09/18/2022   Well adult exam 06/08/2022   Preterm delivery 04/01/2022   Incompetent cervix in pregnancy, antepartum, second trimester 04/01/2022   Pregnancy resulting from in vitro fertilization in second trimester 03/31/2022   Premature rupture of membranes in second trimester 03/31/2022   Allergic conjunctivitis of both eyes 12/16/2020   Allergic conjunctivitis 09/29/2020   Insomnia 07/18/2018   Elevated ALT measurement 07/18/2018   Snoring 01/03/2017   Chronic low back pain without sciatica 01/03/2017   Vitamin D deficiency 11/02/2016   Morbid obesity with BMI of 40.0-44.9, adult (HCC) 11/02/2016   Dermatographia 11/02/2016   Cold sore 11/02/2016   Perennial allergic rhinitis 11/02/2016   Acne 11/02/2016   Depressive disorder 11/25/2014   Herpes simplex 12/02/2013   Esophageal reflux 12/02/2013    PCP: Everrett Coombe, DO   REFERRING PROVIDER: Olivia Mackie, MD   REFERRING DIAG: M54.50 (ICD-10-CM) - Low back pain, unspecified   Rationale for Evaluation and Treatment: Rehabilitation  THERAPY DIAG:  Other low back pain  Pain in right hip  Cramp and spasm  ONSET DATE: ~ 1 month  SUBJECTIVE:  SUBJECTIVE STATEMENT: No pain yesterday but woke up with more back pain today.    PERTINENT HISTORY:  [redacted] weeks pregnant, h/o anemia, leukocytosis, LBP  PAIN:  Are you having pain? Yes: NPRS scale: 5-6/10 Pain location:  low back, R hip/glutes  Pain description: consistent dull pain in hip, acute sharp pain in back Aggravating factors: 7-8/10 at worst, laying on side Relieving factors: heating pad, massage, PT  PRECAUTIONS: Other: pregnant, no DN in lumbar/sacral region  RED FLAGS: None   WEIGHT BEARING RESTRICTIONS: No  FALLS:  Has patient fallen in last 6 months?  No  LIVING ENVIRONMENT: Lives with: lives with their spouse Lives in: House/apartment Stairs: Yes: Internal: 14 steps; on right going up and on left going up and External: 1 steps; none Has following equipment at home: None  OCCUPATION: full time at [employer], but works at home most of the time.   PLOF: Independent  PATIENT GOALS: "lesses the pain, strengthen the muscles, stretch things out so not as painful"  NEXT MD VISIT: 08/25/2023  OBJECTIVE:   DIAGNOSTIC FINDINGS:  No relevant imaging  PATIENT SURVEYS:  Modified Oswestry 21/50   COGNITION: Overall cognitive status: Within functional limits for tasks assessed     SENSATION: WFL  MUSCLE LENGTH: Hamstrings: Right 90 deg; Left 90 deg  POSTURE: No Significant postural limitations  PALPATION: Tenderness L4/L5, bil QL, glutes and piriformis, SIJ, greater trochanters, Right worse than left.   LUMBAR ROM:   AROM eval  Flexion   Extension   Right lateral flexion   Left lateral flexion   Right rotation   Left rotation    (Blank rows = not tested)  LOWER EXTREMITY ROM:   good hip mobility bilaterally for both hip internal and external rotation.    LOWER EXTREMITY MMT:    MMT Right eval Left eval  Hip flexion 4+ 4+  Hip extension    Hip abduction 5 (in SL) 5 (in SL)  Hip adduction 5 5  Knee flexion 5 5  Knee extension 5 5  Ankle dorsiflexion 5 5  Ankle plantarflexion     (Blank rows = not tested)  LUMBAR SPECIAL TESTS:  Straight leg raise test: Negative and FABER test: Negative  FUNCTIONAL TESTS:    GAIT: Distance walked: 12' Assistive device utilized: None Level of assistance: Complete Independence Comments: no significant deviation or device  TODAY'S TREATMENT:                                                                                                                              DATE:  09/07/23 Therapeutic Exercise: to improve strength and mobility.  Demo, verbal and tactile cues  throughout for technique. Nustep L5 x 3 min On physioball- pelvic circles and tilts.  Ball placed in corner for safety with chair with arm next to it to assist with transferring safely.  Squats x 10 Manual Therapy: to decrease muscle spasm and pain and improve mobility STM/TPR  to bil glute med and max, piriformis, skilled palpation and monitoring during dry needling. Trigger Point Dry-Needling  Treatment instructions: Expect mild to moderate muscle soreness. S/S of pneumothorax if dry needled over a lung field, and to seek immediate medical attention should they occur. Patient verbalized understanding of these instructions and education. Patient Consent Given: Yes Education handout provided: Yes Muscles treated: bil glute med and piriformis Electrical stimulation performed: No Parameters: N/A Treatment response/outcome: Twitch Response Elicited and Palpable Increase in Muscle Length  09/04/23 Therapeutic Exercise: to improve strength and mobility.  Demo, verbal and tactile cues throughout for technique. Patient placed in reclined position rather than supine for safety: PPT x 10 with breathing x 15 : Bent knee fall outs x 10  PPT with pillow squeeze x 15 Manual Therapy: to decrease muscle spasm and pain and improve mobility STM/TPR to bil glute med and max, piriformis, skilled palpation and monitoring during dry needling. Trigger Point Dry-Needling  Treatment instructions: Expect mild to moderate muscle soreness. S/S of pneumothorax if dry needled over a lung field, and to seek immediate medical attention should they occur. Patient verbalized understanding of these instructions and education. Patient Consent Given: Yes Education handout provided: Yes Muscles treated: bil glute med and piriformis Electrical stimulation performed: No Parameters: N/A Treatment response/outcome: Twitch Response Elicited and Palpable Increase in Muscle Length  08/28/23 Therapeutic Exercise: to improve strength  and mobility.  Demo, verbal and tactile cues throughout for technique. Placed in reclined position for safety: Diaphragmatic Breathing for TrA contraction x 10 PPT with TrA contraction x 10  Bent knee fall outs alternating with TrA x 10  PPT with pillow squeeze and TrA contraction x 10 Knee rocks Manual Therapy: to decrease muscle spasm and pain and improve mobility STM/TPR to bil glutes, skilled palpation and monitoring during dry needling. Trigger Point Dry-Needling  Treatment instructions: Expect mild to moderate muscle soreness. S/S of pneumothorax if dry needled over a lung field, and to seek immediate medical attention should they occur. Patient verbalized understanding of these instructions and education. Patient Consent Given: Yes Education handout provided: Yes Muscles treated: bil glute med and piriformis Electrical stimulation performed: No Parameters: N/A Treatment response/outcome: Twitch Response Elicited and Palpable Increase in Muscle Length  PATIENT EDUCATION:  Education details: continue HEP Person educated: Patient Education method: Explanation Education comprehension: verbalized understanding  HOME EXERCISE PROGRAM: Access Code: [email or web address] URL: [email or web address] Date: 08/28/2023 Prepared by: Harrie Foreman  Exercises - Supine Diaphragmatic Breathing  - 1 x daily - 7 x weekly - 1 sets - 10 reps - Seated Diaphragmatic Breathing  - 1 x daily - 7 x weekly - 1 sets - 10 reps - Supine Posterior Pelvic Tilt  - 1 x daily - 7 x weekly - 1 sets - 10 reps - Seated Pelvic Tilt  - 1 x daily - 7 x weekly - 1 sets - 10 reps - Posterior Pelvic Tilt with Adduction Using Pillow in Hooklying  - 1 x daily - 7 x weekly - 1 sets - 10 reps - Bent Knee Fallouts  - 1 x daily - 7 x weekly - 1 sets - 10 reps - Supine Posterior Pelvic Tilt with Knee Rocks  - 1 x daily - 7 x weekly - 1 sets - 10 reps  ASSESSMENT:  CLINICAL IMPRESSION: Today trialed sitting on  physioball as another surface to both help engage core, help wih lumbopelvic mobility, and decrease LBP.  Tolerated well, discussed size and safety while sitting on ball.  Continued with manual therapy and TrDN, reported decreased pain and tightness following.   Mumtaz [place] Brunton continues to demonstrate potential for improvement and would benefit from continued skilled therapy to address impairments.       OBJECTIVE IMPAIRMENTS: decreased activity tolerance, increased fascial restrictions, increased muscle spasms, and pain.   ACTIVITY LIMITATIONS: carrying, lifting, bending, sleeping, bed mobility, and locomotion level  PARTICIPATION LIMITATIONS: meal prep, cleaning, laundry, shopping, and community activity  PERSONAL FACTORS: Past/current experiences and 1-2 comorbidities: history of LBP, pregnancy, obesity   are also affecting patient's functional outcome.   REHAB POTENTIAL: Good  CLINICAL DECISION MAKING: Evolving/moderate complexity  EVALUATION COMPLEXITY: Moderate   GOALS: Goals reviewed with patient? Yes  SHORT TERM GOALS: Target date: 09/07/2023   Patient will be independent with initial HEP.  Baseline:  Given 08/28/23 Goal status: MET 09/04/23   LONG TERM GOALS: Target date: 11/16/2023   Patient will be independent with advanced/ongoing HEP to improve outcomes and carryover.  Baseline:   Goal status: IN PROGRESS  2.  Patient will report 50% improvement in low back pain/R hip pain to improve QOL.  Baseline:  Goal status: IN PROGRESS  3.  Patient will demonstrate full pain free lumbar ROM to perform ADLs.   Baseline:  Goal status: IN PROGRESS  4.  Patient will report at least 6 points improvement on modified Oswestry to demonstrate improved functional ability.  Baseline: 21/50 Goal status: IN PROGRESS   5. Patient will report 50% improvement in sleep quality  Baseline: increased pain at night preventing sleep Goal status: IN PROGRESS   PLAN:  PT  FREQUENCY: 2x/week for 6-8 weeks decreasing to 1x per week when appropriate for 12 weeks total  PT DURATION: 12 weeks  PLANNED INTERVENTIONS: Therapeutic exercises, Therapeutic activity, Neuromuscular re-education, Balance training, Gait training, Patient/Family education, Self Care, Joint mobilization, Dry Needling, Electrical stimulation, Spinal mobilization, Cryotherapy, Moist heat, Manual therapy, and Re-evaluation.  PLAN FOR NEXT SESSION: core stabilization exercises, can lie on back for short periods but try in semi-reclined, manual therapy, TrDN to glutes/TFL only, no needling in spine region.  Consider Ktape to abdomen also.     Jena Gauss, PT, DPT  09/07/2023, 1:09 PM

## 2023-09-11 ENCOUNTER — Ambulatory Visit: Payer: 59 | Admitting: Physical Therapy

## 2023-09-11 ENCOUNTER — Encounter: Payer: Self-pay | Admitting: Physical Therapy

## 2023-09-11 DIAGNOSIS — M5459 Other low back pain: Secondary | ICD-10-CM | POA: Diagnosis not present

## 2023-09-11 DIAGNOSIS — M25551 Pain in right hip: Secondary | ICD-10-CM

## 2023-09-11 DIAGNOSIS — R252 Cramp and spasm: Secondary | ICD-10-CM

## 2023-09-11 NOTE — Therapy (Signed)
OUTPATIENT PHYSICAL THERAPY TREATMENT   Patient Name: Debra [name] MRN: [id number] DOB:[birth year], 42 y.o., female Today's Date: 09/11/2023  END OF SESSION:  PT End of Session - 09/11/23 0804     Visit Number 5    Date for PT Re-Evaluation 11/16/23    Authorization Type UHC [id number]    PT Start Time 0802    PT Stop Time 0845    PT Time Calculation (min) 43 min    Activity Tolerance Patient tolerated treatment well    Behavior During Therapy [facility] for tasks assessed/performed             Past Medical History:  Diagnosis Date   Acne    Allergic rhinitis    Cold sore 11/02/2016   Depression    GERD (gastroesophageal reflux disease)    Hearing loss of both ears    per pt right > left,  does not wear hearing aids   IDA (iron deficiency anemia)    Incompetent cervix    Infertility, female    Mild obstructive sleep apnea    per pt had sleep study was very mild osa,  did not meet critiria for insurance to pay but pt paid out of pocket since it helps due to dust mite allergy   Prior pregnancy with incompetent cervix in second trimester, antepartum 04/01/2022   03-31-2022 pre-mature ruptured membrane [redacted]wk gestation, nonviable   Uterine polyp    Past Surgical History:  Procedure Laterality Date   CERCLAGE LAPAROSCOPIC ABDOMINAL N/A 07/27/2022   Procedure: CERCLAGE LAPAROSCOPIC CERVICO- ABDOMINAL AND LYSIS OF ADHESIONS;  Surgeon: Fermin Schwab, MD;  Location: [facility];  Service: Gynecology;  Laterality: N/A;   DILATION AND CURETTAGE OF UTERUS N/A 04/01/2022   Procedure: DILATATION AND CURETTAGE;  Surgeon: Shea Evans, MD;  Location: [facility];  Service: Gynecology;  Laterality: N/A;   HYSTEROSCOPY N/A 07/27/2022   Procedure: HYSTEROSCOPY WITH ENDOMETRIAL BIOPSY;  Surgeon: Fermin Schwab, MD;  Location: [facility];  Service: Gynecology;  Laterality: N/A;   Patient Active Problem List   Diagnosis Date Noted   Pregnancy 07/03/2023    Pharyngitis 09/18/2022   Well adult exam 06/08/2022   Preterm delivery 04/01/2022   Incompetent cervix in pregnancy, antepartum, second trimester 04/01/2022   Pregnancy resulting from in vitro fertilization in second trimester 03/31/2022   Premature rupture of membranes in second trimester 03/31/2022   Allergic conjunctivitis of both eyes 12/16/2020   Allergic conjunctivitis 09/29/2020   Insomnia 07/18/2018   Elevated ALT measurement 07/18/2018   Snoring 01/03/2017   Chronic low back pain without sciatica 01/03/2017   Vitamin D deficiency 11/02/2016   Morbid obesity with BMI of 40.0-44.9, adult (HCC) 11/02/2016   Dermatographia 11/02/2016   Cold sore 11/02/2016   Perennial allergic rhinitis 11/02/2016   Acne 11/02/2016   Depressive disorder 11/25/2014   Herpes simplex 12/02/2013   Esophageal reflux 12/02/2013    PCP: Everrett Coombe, DO   REFERRING PROVIDER: Olivia Mackie, MD   REFERRING DIAG: M54.50 (ICD-10-CM) - Low back pain, unspecified   Rationale for Evaluation and Treatment: Rehabilitation  THERAPY DIAG:  Other low back pain  Pain in right hip  Cramp and spasm  ONSET DATE: ~ 1 month  SUBJECTIVE:  SUBJECTIVE STATEMENT: Woke up with 7/10 hip pain but moving around brings it down to 5/10.  Mornings are the worst.     PERTINENT HISTORY:  [redacted] weeks pregnant, h/o anemia, leukocytosis, LBP  PAIN:  Are you having pain? Yes: NPRS scale: 5/10 Pain location:  low back, R hip/glutes  Pain description: consistent dull pain in hip, acute sharp pain in back Aggravating factors: 7-8/10 at worst, laying on side Relieving factors: heating pad, massage, PT  PRECAUTIONS: Other: pregnant, no DN in lumbar/sacral region  RED FLAGS: None   WEIGHT BEARING RESTRICTIONS: No  FALLS:  Has  patient fallen in last 6 months? No  LIVING ENVIRONMENT: Lives with: lives with their spouse Lives in: House/apartment Stairs: Yes: Internal: 14 steps; on right going up and on left going up and External: 1 steps; none Has following equipment at home: None  OCCUPATION: full time at [employer], but works at home most of the time.   PLOF: Independent  PATIENT GOALS: "lesses the pain, strengthen the muscles, stretch things out so not as painful"  NEXT MD VISIT: 08/25/2023  OBJECTIVE:   DIAGNOSTIC FINDINGS:  No relevant imaging  PATIENT SURVEYS:  Modified Oswestry 21/50   COGNITION: Overall cognitive status: Within functional limits for tasks assessed     SENSATION: WFL  MUSCLE LENGTH: Hamstrings: Right 90 deg; Left 90 deg  POSTURE: No Significant postural limitations  PALPATION: Tenderness L4/L5, bil QL, glutes and piriformis, SIJ, greater trochanters, Right worse than left.   LUMBAR ROM:   AROM eval  Flexion   Extension   Right lateral flexion   Left lateral flexion   Right rotation   Left rotation    (Blank rows = not tested)  LOWER EXTREMITY ROM:   good hip mobility bilaterally for both hip internal and external rotation.    LOWER EXTREMITY MMT:    MMT Right eval Left eval  Hip flexion 4+ 4+  Hip extension    Hip abduction 5 (in SL) 5 (in SL)  Hip adduction 5 5  Knee flexion 5 5  Knee extension 5 5  Ankle dorsiflexion 5 5  Ankle plantarflexion     (Blank rows = not tested)  LUMBAR SPECIAL TESTS:  Straight leg raise test: Negative and FABER test: Negative  FUNCTIONAL TESTS:    GAIT: Distance walked: 55' Assistive device utilized: None Level of assistance: Complete Independence Comments: no significant deviation or device  TODAY'S TREATMENT:                                                                                                                              DATE:   09/11/23 Therapeutic Exercise: to improve strength and mobility.   Demo, verbal and tactile cues throughout for technique. Nustep L5 x 3 min Review of HEP, seated pelvic tilts Self Care: Education on different positioning in bed with pillows and strategies for rolling over to help with pain.  Manual Therapy: to decrease muscle  spasm and pain and improve mobility STM/TPR to bil glute med and max, piriformis, QL, skilled palpation and monitoring during dry needling. Trigger Point Dry-Needling  Treatment instructions: Expect mild to moderate muscle soreness. S/S of pneumothorax if dry needled over a lung field, and to seek immediate medical attention should they occur. Patient verbalized understanding of these instructions and education. Patient Consent Given: Yes Education handout provided: Yes Muscles treated: bil glute med and piriformis Electrical stimulation performed: No Parameters: N/A Treatment response/outcome: Twitch Response Elicited and Palpable Increase in Muscle Length  09/07/23 Therapeutic Exercise: to improve strength and mobility.  Demo, verbal and tactile cues throughout for technique. Nustep L5 x 3 min On physioball- pelvic circles and tilts.  Ball placed in corner for safety with chair with arm next to it to assist with transferring safely.  Squats x 10 Manual Therapy: to decrease muscle spasm and pain and improve mobility STM/TPR to bil glute med and max, piriformis, skilled palpation and monitoring during dry needling. Trigger Point Dry-Needling  Treatment instructions: Expect mild to moderate muscle soreness. S/S of pneumothorax if dry needled over a lung field, and to seek immediate medical attention should they occur. Patient verbalized understanding of these instructions and education. Patient Consent Given: Yes Education handout provided: Yes Muscles treated: bil glute med and piriformis Electrical stimulation performed: No Parameters: N/A Treatment response/outcome: Twitch Response Elicited and Palpable Increase in Muscle  Length  09/04/23 Therapeutic Exercise: to improve strength and mobility.  Demo, verbal and tactile cues throughout for technique. Patient placed in reclined position rather than supine for safety: PPT x 10 with breathing x 15 : Bent knee fall outs x 10  PPT with pillow squeeze x 15 Manual Therapy: to decrease muscle spasm and pain and improve mobility STM/TPR to bil glute med and max, piriformis, skilled palpation and monitoring during dry needling. Trigger Point Dry-Needling  Treatment instructions: Expect mild to moderate muscle soreness. S/S of pneumothorax if dry needled over a lung field, and to seek immediate medical attention should they occur. Patient verbalized understanding of these instructions and education. Patient Consent Given: Yes Education handout provided: Yes Muscles treated: bil glute med and piriformis Electrical stimulation performed: No Parameters: N/A Treatment response/outcome: Twitch Response Elicited and Palpable Increase in Muscle Length  PATIENT EDUCATION:  Education details: continue HEP Person educated: Patient Education method: Explanation Education comprehension: verbalized understanding  HOME EXERCISE PROGRAM: Access Code: [email or web address] URL: [email or web address] Date: 08/28/2023 Prepared by: Harrie Foreman  Exercises - Supine Diaphragmatic Breathing  - 1 x daily - 7 x weekly - 1 sets - 10 reps - Seated Diaphragmatic Breathing  - 1 x daily - 7 x weekly - 1 sets - 10 reps - Supine Posterior Pelvic Tilt  - 1 x daily - 7 x weekly - 1 sets - 10 reps - Seated Pelvic Tilt  - 1 x daily - 7 x weekly - 1 sets - 10 reps - Posterior Pelvic Tilt with Adduction Using Pillow in Hooklying  - 1 x daily - 7 x weekly - 1 sets - 10 reps - Bent Knee Fallouts  - 1 x daily - 7 x weekly - 1 sets - 10 reps - Supine Posterior Pelvic Tilt with Knee Rocks  - 1 x daily - 7 x weekly - 1 sets - 10 reps  ASSESSMENT:  CLINICAL IMPRESSION: Debra reports pain is  worse in morning.  Discussed strategies for sleeping positions and rolling to help address and support abdomen to decrease pull.  Continued  with manual therapy and TrDN which she reports is decreasing tightness and discomfort in hips and back.   Lark [place] [name] continues to demonstrate potential for improvement and would benefit from continued skilled therapy to address impairments.       OBJECTIVE IMPAIRMENTS: decreased activity tolerance, increased fascial restrictions, increased muscle spasms, and pain.   ACTIVITY LIMITATIONS: carrying, lifting, bending, sleeping, bed mobility, and locomotion level  PARTICIPATION LIMITATIONS: meal prep, cleaning, laundry, shopping, and community activity  PERSONAL FACTORS: Past/current experiences and 1-2 comorbidities: history of LBP, pregnancy, obesity   are also affecting patient's functional outcome.   REHAB POTENTIAL: Good  CLINICAL DECISION MAKING: Evolving/moderate complexity  EVALUATION COMPLEXITY: Moderate   GOALS: Goals reviewed with patient? Yes  SHORT TERM GOALS: Target date: 09/07/2023   Patient will be independent with initial HEP.  Baseline:  Given 08/28/23 Goal status: MET 09/04/23   LONG TERM GOALS: Target date: 11/16/2023   Patient will be independent with advanced/ongoing HEP to improve outcomes and carryover.  Baseline:   Goal status: IN PROGRESS  2.  Patient will report 50% improvement in low back pain/R hip pain to improve QOL.  Baseline:  Goal status: IN PROGRESS  3.  Patient will demonstrate full pain free lumbar ROM to perform ADLs.   Baseline:  Goal status: IN PROGRESS  4.  Patient will report at least 6 points improvement on modified Oswestry to demonstrate improved functional ability.  Baseline: 21/50 Goal status: IN PROGRESS   5. Patient will report 50% improvement in sleep quality  Baseline: increased pain at night preventing sleep Goal status: IN PROGRESS   PLAN:  PT FREQUENCY: 2x/week for 6-8  weeks decreasing to 1x per week when appropriate for 12 weeks total  PT DURATION: 12 weeks  PLANNED INTERVENTIONS: Therapeutic exercises, Therapeutic activity, Neuromuscular re-education, Balance training, Gait training, Patient/Family education, Self Care, Joint mobilization, Dry Needling, Electrical stimulation, Spinal mobilization, Cryotherapy, Moist heat, Manual therapy, and Re-evaluation.  PLAN FOR NEXT SESSION: core stabilization exercises, can lie on back for short periods but try in semi-reclined, manual therapy, TrDN to glutes/TFL only, no needling in spine region.  Consider Ktape to abdomen also.     Jena Gauss, PT, DPT  09/11/2023, 11:06 AM

## 2023-09-14 ENCOUNTER — Ambulatory Visit: Payer: 59 | Admitting: Physical Therapy

## 2023-09-14 ENCOUNTER — Encounter: Payer: Self-pay | Admitting: Physical Therapy

## 2023-09-14 DIAGNOSIS — M5459 Other low back pain: Secondary | ICD-10-CM

## 2023-09-14 DIAGNOSIS — M25551 Pain in right hip: Secondary | ICD-10-CM

## 2023-09-14 DIAGNOSIS — R252 Cramp and spasm: Secondary | ICD-10-CM

## 2023-09-14 NOTE — Therapy (Signed)
OUTPATIENT PHYSICAL THERAPY TREATMENT   Patient Name: Debra [name] MRN: [id number] DOB:Oct 06, 1981, 42 y.o., female Today's Date: 09/14/2023  END OF SESSION:  PT End of Session - 09/14/23 1152     Visit Number 6    Date for PT Re-Evaluation 11/16/23    Authorization Type UHC [id number]    PT Start Time 1150    PT Stop Time 1227    PT Time Calculation (min) 37 min    Activity Tolerance Patient tolerated treatment well    Behavior During Therapy WFL for tasks assessed/performed             Past Medical History:  Diagnosis Date   Acne    Allergic rhinitis    Cold sore 11/02/2016   Depression    GERD (gastroesophageal reflux disease)    Hearing loss of both ears    per pt right > left,  does not wear hearing aids   IDA (iron deficiency anemia)    Incompetent cervix    Infertility, female    Mild obstructive sleep apnea    per pt had sleep study was very mild osa,  did not meet critiria for insurance to pay but pt paid out of pocket since it helps due to dust mite allergy   Prior pregnancy with incompetent cervix in second trimester, antepartum 04/01/2022   03-31-2022 pre-mature ruptured membrane [redacted]wk gestation, nonviable   Uterine polyp    Past Surgical History:  Procedure Laterality Date   CERCLAGE LAPAROSCOPIC ABDOMINAL N/A 07/27/2022   Procedure: CERCLAGE LAPAROSCOPIC CERVICO- ABDOMINAL AND LYSIS OF ADHESIONS;  Surgeon: Fermin Schwab, MD;  Location: [facility];  Service: Gynecology;  Laterality: N/A;   DILATION AND CURETTAGE OF UTERUS N/A 04/01/2022   Procedure: DILATATION AND CURETTAGE;  Surgeon: Shea Evans, MD;  Location: [facility];  Service: Gynecology;  Laterality: N/A;   HYSTEROSCOPY N/A 07/27/2022   Procedure: HYSTEROSCOPY WITH ENDOMETRIAL BIOPSY;  Surgeon: Fermin Schwab, MD;  Location: [facility];  Service: Gynecology;  Laterality: N/A;   Patient Active Problem List   Diagnosis Date Noted   Pregnancy 07/03/2023    Pharyngitis 09/18/2022   Well adult exam 06/08/2022   Preterm delivery 04/01/2022   Incompetent cervix in pregnancy, antepartum, second trimester 04/01/2022   Pregnancy resulting from in vitro fertilization in second trimester 03/31/2022   Premature rupture of membranes in second trimester 03/31/2022   Allergic conjunctivitis of both eyes 12/16/2020   Allergic conjunctivitis 09/29/2020   Insomnia 07/18/2018   Elevated ALT measurement 07/18/2018   Snoring 01/03/2017   Chronic low back pain without sciatica 01/03/2017   Vitamin D deficiency 11/02/2016   Morbid obesity with BMI of 40.0-44.9, adult (HCC) 11/02/2016   Dermatographia 11/02/2016   Cold sore 11/02/2016   Perennial allergic rhinitis 11/02/2016   Acne 11/02/2016   Depressive disorder 11/25/2014   Herpes simplex 12/02/2013   Esophageal reflux 12/02/2013    PCP: Everrett Coombe, DO   REFERRING PROVIDER: Olivia Mackie, MD   REFERRING DIAG: M54.50 (ICD-10-CM) - Low back pain, unspecified   Rationale for Evaluation and Treatment: Rehabilitation  THERAPY DIAG:  Other low back pain  Pain in right hip  Cramp and spasm  ONSET DATE: ~ 1 month  SUBJECTIVE:  SUBJECTIVE STATEMENT: Reports using her Estim on her back, between that an PT her pain is much more manageable.   Has also been doing core exercises at home.   PERTINENT HISTORY:  [redacted] weeks pregnant, h/o anemia, leukocytosis, LBP  PAIN:  Are you having pain? Yes: NPRS scale: 4/10 Pain location:  low back, R hip/glutes  Pain description: consistent dull pain in hip, acute sharp pain in back Aggravating factors: 7-8/10 at worst, laying on side Relieving factors: heating pad, massage, PT  PRECAUTIONS: Other: pregnant, no DN in lumbar/sacral region  RED FLAGS: None   WEIGHT  BEARING RESTRICTIONS: No  FALLS:  Has patient fallen in last 6 months? No  LIVING ENVIRONMENT: Lives with: lives with their spouse Lives in: House/apartment Stairs: Yes: Internal: 14 steps; on right going up and on left going up and External: 1 steps; none Has following equipment at home: None  OCCUPATION: full time at [employer], but works at home most of the time.   PLOF: Independent  PATIENT GOALS: "lesses the pain, strengthen the muscles, stretch things out so not as painful"  NEXT MD VISIT: 08/25/2023  OBJECTIVE:   DIAGNOSTIC FINDINGS:  No relevant imaging  PATIENT SURVEYS:  Modified Oswestry 21/50   COGNITION: Overall cognitive status: Within functional limits for tasks assessed     SENSATION: WFL  MUSCLE LENGTH: Hamstrings: Right 90 deg; Left 90 deg  POSTURE: No Significant postural limitations  PALPATION: Tenderness L4/L5, bil QL, glutes and piriformis, SIJ, greater trochanters, Right worse than left.   LUMBAR ROM:   AROM eval  Flexion   Extension   Right lateral flexion   Left lateral flexion   Right rotation   Left rotation    (Blank rows = not tested)  LOWER EXTREMITY ROM:   good hip mobility bilaterally for both hip internal and external rotation.    LOWER EXTREMITY MMT:    MMT Right eval Left eval  Hip flexion 4+ 4+  Hip extension    Hip abduction 5 (in SL) 5 (in SL)  Hip adduction 5 5  Knee flexion 5 5  Knee extension 5 5  Ankle dorsiflexion 5 5  Ankle plantarflexion     (Blank rows = not tested)  LUMBAR SPECIAL TESTS:  Straight leg raise test: Negative and FABER test: Negative  FUNCTIONAL TESTS:    GAIT: Distance walked: 43' Assistive device utilized: None Level of assistance: Complete Independence Comments: no significant deviation or device  TODAY'S TREATMENT:                                                                                                                              DATE:  09/14/23 Manual Therapy: to  decrease muscle spasm and pain and improve mobility STM/TPR to bil glute med and max, piriformis, QL, skilled palpation and monitoring during dry needling. Trigger Point Dry-Needling  Treatment instructions: Expect mild to moderate muscle soreness. S/S of pneumothorax if dry needled over a  lung field, and to seek immediate medical attention should they occur. Patient verbalized understanding of these instructions and education. Patient Consent Given: Yes Education handout provided: Yes Muscles treated: bil glute med and piriformis Electrical stimulation performed: No Parameters: N/A Treatment response/outcome: Twitch Response Elicited and Palpable Increase in Muscle Length    09/11/23 Therapeutic Exercise: to improve strength and mobility.  Demo, verbal and tactile cues throughout for technique. Nustep L5 x 3 min Review of HEP, seated pelvic tilts Self Care: Education on different positioning in bed with pillows and strategies for rolling over to help with pain.  Manual Therapy: to decrease muscle spasm and pain and improve mobility STM/TPR to bil glute med and max, piriformis, QL, skilled palpation and monitoring during dry needling. Trigger Point Dry-Needling  Treatment instructions: Expect mild to moderate muscle soreness. S/S of pneumothorax if dry needled over a lung field, and to seek immediate medical attention should they occur. Patient verbalized understanding of these instructions and education. Patient Consent Given: Yes Education handout provided: Yes Muscles treated: bil glute med and piriformis Electrical stimulation performed: No Parameters: N/A Treatment response/outcome: Twitch Response Elicited and Palpable Increase in Muscle Length  09/07/23 Therapeutic Exercise: to improve strength and mobility.  Demo, verbal and tactile cues throughout for technique. Nustep L5 x 3 min On physioball- pelvic circles and tilts.  Ball placed in corner for safety with chair with arm next  to it to assist with transferring safely.  Squats x 10 Manual Therapy: to decrease muscle spasm and pain and improve mobility STM/TPR to bil glute med and max, piriformis, skilled palpation and monitoring during dry needling. Trigger Point Dry-Needling  Treatment instructions: Expect mild to moderate muscle soreness. S/S of pneumothorax if dry needled over a lung field, and to seek immediate medical attention should they occur. Patient verbalized understanding of these instructions and education. Patient Consent Given: Yes Education handout provided: Yes Muscles treated: bil glute med and piriformis Electrical stimulation performed: No Parameters: N/A Treatment response/outcome: Twitch Response Elicited and Palpable Increase in Muscle Length   PATIENT EDUCATION:  Education details: continue HEP Person educated: Patient Education method: Explanation Education comprehension: verbalized understanding  HOME EXERCISE PROGRAM: Access Code: [email or web address] URL: [email or web address] Date: 08/28/2023 Prepared by: Harrie Foreman  Exercises - Supine Diaphragmatic Breathing  - 1 x daily - 7 x weekly - 1 sets - 10 reps - Seated Diaphragmatic Breathing  - 1 x daily - 7 x weekly - 1 sets - 10 reps - Supine Posterior Pelvic Tilt  - 1 x daily - 7 x weekly - 1 sets - 10 reps - Seated Pelvic Tilt  - 1 x daily - 7 x weekly - 1 sets - 10 reps - Posterior Pelvic Tilt with Adduction Using Pillow in Hooklying  - 1 x daily - 7 x weekly - 1 sets - 10 reps - Bent Knee Fallouts  - 1 x daily - 7 x weekly - 1 sets - 10 reps - Supine Posterior Pelvic Tilt with Knee Rocks  - 1 x daily - 7 x weekly - 1 sets - 10 reps  ASSESSMENT:  CLINICAL IMPRESSION: Debra [name] reports good benefit with PT including manual therapy and core strengthening, as well as estim now at work.  Continued to focus on TrDN to glutes with strong twitch response bil, reporting decreased tightness and pain following.    Debra [name] continues to demonstrate potential for improvement and would benefit from continued skilled therapy to address impairments.  OBJECTIVE IMPAIRMENTS: decreased activity tolerance, increased fascial restrictions, increased muscle spasms, and pain.   ACTIVITY LIMITATIONS: carrying, lifting, bending, sleeping, bed mobility, and locomotion level  PARTICIPATION LIMITATIONS: meal prep, cleaning, laundry, shopping, and community activity  PERSONAL FACTORS: Past/current experiences and 1-2 comorbidities: history of LBP, pregnancy, obesity   are also affecting patient's functional outcome.   REHAB POTENTIAL: Good  CLINICAL DECISION MAKING: Evolving/moderate complexity  EVALUATION COMPLEXITY: Moderate   GOALS: Goals reviewed with patient? Yes  SHORT TERM GOALS: Target date: 09/07/2023   Patient will be independent with initial HEP.  Baseline:  Given 08/28/23 Goal status: MET 09/04/23   LONG TERM GOALS: Target date: 11/16/2023   Patient will be independent with advanced/ongoing HEP to improve outcomes and carryover.  Baseline:   Goal status: IN PROGRESS  2.  Patient will report 50% improvement in low back pain/R hip pain to improve QOL.  Baseline:  Goal status: IN PROGRESS  3.  Patient will demonstrate full pain free lumbar ROM to perform ADLs.   Baseline:  Goal status: IN PROGRESS  4.  Patient will report at least 6 points improvement on modified Oswestry to demonstrate improved functional ability.  Baseline: 21/50 Goal status: IN PROGRESS   5. Patient will report 50% improvement in sleep quality  Baseline: increased pain at night preventing sleep Goal status: IN PROGRESS   PLAN:  PT FREQUENCY: 2x/week for 6-8 weeks decreasing to 1x per week when appropriate for 12 weeks total  PT DURATION: 12 weeks  PLANNED INTERVENTIONS: Therapeutic exercises, Therapeutic activity, Neuromuscular re-education, Balance training, Gait training, Patient/Family  education, Self Care, Joint mobilization, Dry Needling, Electrical stimulation, Spinal mobilization, Cryotherapy, Moist heat, Manual therapy, and Re-evaluation.  PLAN FOR NEXT SESSION: core stabilization exercises, can lie on back for short periods but try in semi-reclined, manual therapy, TrDN to glutes/TFL only, no needling in spine region.  Consider Ktape to abdomen also.     Jena Gauss, PT, DPT  09/14/2023, 12:33 PM

## 2023-09-15 ENCOUNTER — Encounter (HOSPITAL_COMMUNITY): Payer: Self-pay | Admitting: Obstetrics & Gynecology

## 2023-09-15 ENCOUNTER — Inpatient Hospital Stay (HOSPITAL_COMMUNITY)
Admission: AD | Admit: 2023-09-15 | Discharge: 2023-09-15 | Disposition: A | Discharge status: Home / Self Care | Payer: 59 | Attending: Obstetrics and Gynecology | Admitting: Obstetrics and Gynecology

## 2023-09-15 DIAGNOSIS — O162 Unspecified maternal hypertension, second trimester: Secondary | ICD-10-CM

## 2023-09-15 DIAGNOSIS — O26872 Cervical shortening, second trimester: Secondary | ICD-10-CM | POA: Insufficient documentation

## 2023-09-15 DIAGNOSIS — O368120 Decreased fetal movements, second trimester, not applicable or unspecified: Secondary | ICD-10-CM

## 2023-09-15 DIAGNOSIS — Z3A24 24 weeks gestation of pregnancy: Secondary | ICD-10-CM | POA: Diagnosis not present

## 2023-09-15 LAB — CBC
HCT: 41.9 % (ref 36.0–46.0)
Hemoglobin: 13.3 g/dL (ref 12.0–15.0)
MCH: 25.8 pg — ABNORMAL LOW (ref 26.0–34.0)
MCHC: 31.7 g/dL (ref 30.0–36.0)
MCV: 81.4 fL (ref 80.0–100.0)
Platelets: 227 10*3/uL (ref 150–400)
RBC: 5.15 MIL/uL — ABNORMAL HIGH (ref 3.87–5.11)
RDW: 14.1 % (ref 11.5–15.5)
WBC: 9 10*3/uL (ref 4.0–10.5)
nRBC: 0 % (ref 0.0–0.2)

## 2023-09-15 LAB — COMPREHENSIVE METABOLIC PANEL
ALT: 15 U/L (ref 0–44)
AST: 18 U/L (ref 15–41)
Albumin: 3.2 g/dL — ABNORMAL LOW (ref 3.5–5.0)
Alkaline Phosphatase: 62 U/L (ref 38–126)
Anion gap: 10 (ref 5–15)
BUN: 9 mg/dL (ref 6–20)
CO2: 19 mmol/L — ABNORMAL LOW (ref 22–32)
Calcium: 9.6 mg/dL (ref 8.9–10.3)
Chloride: 107 mmol/L (ref 98–111)
Creatinine, Ser: 0.61 mg/dL (ref 0.44–1.00)
GFR, Estimated: >60 mL/min (ref >60)
Glucose, Bld: 108 mg/dL — ABNORMAL HIGH (ref 70–99)
Potassium: 3.9 mmol/L (ref 3.5–5.1)
Sodium: 136 mmol/L (ref 135–145)
Total Bilirubin: 0.4 mg/dL (ref 0.3–1.2)
Total Protein: 6.9 g/dL (ref 6.5–8.1)

## 2023-09-15 LAB — PROTEIN / CREATININE RATIO, URINE
Creatinine, Urine: 41 mg/dL
Total Protein, Urine: <6 mg/dL

## 2023-09-15 NOTE — MAU Provider Note (Signed)
History     [id number]  Arrival date and time: 09/15/23 1035    Chief Complaint  Patient presents with   Decreased Fetal Movement     HPI Debra [name] is a 42 y.o. at [redacted]w[redacted]d who presents for decreased fetal movement. Hx of cervical incompetence. Has abdominal cerclage in place. Reports no movement since last night. Denies abdominal pain, vaginal bleeding, change in vaginal discharge. Sees Dr. Billy Coast for prenatal care - next appointment in 2 weeks.  Denies history of hypertension. States her BP is high when she comes to MAU due to poor obstetric history. Denies headache, visual disturbance, or epigastric pain.   Past Medical History:  Diagnosis Date   Acne    Allergic rhinitis    Cold sore 11/02/2016   Depression    GERD (gastroesophageal reflux disease)    Hearing loss of both ears    per pt right > left,  does not wear hearing aids   IDA (iron deficiency anemia)    Incompetent cervix    Infertility, female    Mild obstructive sleep apnea    per pt had sleep study was very mild osa,  did not meet critiria for insurance to pay but pt paid out of pocket since it helps due to dust mite allergy   Prior pregnancy with incompetent cervix in second trimester, antepartum 04/01/2022   03-31-2022 pre-mature ruptured membrane [redacted]wk gestation, nonviable   Uterine polyp     Past Surgical History:  Procedure Laterality Date   CERCLAGE LAPAROSCOPIC ABDOMINAL N/A 07/27/2022   Procedure: CERCLAGE LAPAROSCOPIC CERVICO- ABDOMINAL AND LYSIS OF ADHESIONS;  Surgeon: Fermin Schwab, MD;  Location: [facility];  Service: Gynecology;  Laterality: N/A;   DILATION AND CURETTAGE OF UTERUS N/A 04/01/2022   Procedure: DILATATION AND CURETTAGE;  Surgeon: Shea Evans, MD;  Location: [facility];  Service: Gynecology;  Laterality: N/A;   HYSTEROSCOPY N/A 07/27/2022   Procedure: HYSTEROSCOPY WITH ENDOMETRIAL BIOPSY;  Surgeon: Fermin Schwab, MD;  Location: [facility];  Service: Gynecology;  Laterality: N/A;    Family History  Problem Relation Age of Onset   Breast cancer Mother    Allergic rhinitis Mother    Eczema Mother    Allergic rhinitis Father    Allergic rhinitis Brother     Allergies  Allergen Reactions   Dust Mite Extract Other (See Comments)    No current facility-administered medications on file prior to encounter.   Current Outpatient Medications on File Prior to Encounter  Medication Sig Dispense Refill   aspirin EC (ASPIRIN LOW DOSE) 81 MG tablet      calcium citrate (CALCITRATE - DOSED IN MG ELEMENTAL CALCIUM) 950 (200 Ca) MG tablet Take 4,200 mg of elemental calcium by mouth daily.     Cholecalciferol (VITAMIN D3) 125 MCG (5000 UT) CAPS Take 1 capsule by mouth daily.     Ergocalciferol (VITAMIN D2 PO) Take 1,000 Int'l Units/day by mouth daily.     lansoprazole (PREVACID) 15 MG capsule Take 15 mg by mouth daily at 12 noon.     loratadine (CLARITIN) 10 MG tablet Take 10 mg by mouth daily.     Omega-3 Fatty Acids (OMEGA-3 FISH OIL PO) Take 5,000 mg by mouth daily.     Prenatal MV-Min-Fe Fum-FA-DHA (PRENATAL+DHA PO)      PSYLLIUM FIBER PO Take 3,000 mg by mouth daily.     diphenhydramine-acetaminophen (TYLENOL PM) 25-500 MG TABS tablet Take 1 tablet by mouth at bedtime as needed.  OVER THE COUNTER MEDICATION Take 1 tablet by mouth daily. Lions' mane supplement (mushroom extract)  2000 mg     Prenatal Vit-Fe Fumarate-FA (PRENATAL MULTIVITAMIN) TABS tablet Take 1 tablet by mouth daily at 12 noon.     Riboflavin (B-2-400 PO) Take 1 tablet by mouth daily.     Vitamin A 3 MG (10000 UT) TABS Take 1 tablet by mouth daily.       ROS Pertinent positives and negative per HPI, all others reviewed and negative  Physical Exam   BP (!) 148/67   Pulse 84   Temp 98.6 F (37 C) (Oral)   Resp 18   LMP 10/07/2022 (Exact Date)   SpO2 98%   Patient Vitals for the past 24 hrs:  BP Temp Temp src Pulse Resp SpO2  09/15/23 1325  (!) 148/67 -- -- 84 -- --  09/15/23 1145 -- -- -- -- -- 98 %  09/15/23 1141 (!) 146/75 -- -- 88 -- --  09/15/23 1140 -- -- -- -- -- 98 %  09/15/23 1135 -- -- -- -- -- 98 %  09/15/23 1130 -- -- -- -- -- 97 %  09/15/23 1125 -- -- -- -- -- 97 %  09/15/23 1120 -- -- -- -- -- 98 %  09/15/23 1115 -- -- -- -- -- 98 %  09/15/23 1113 (!) 140/84 98.6 F (37 C) Oral (!) 101 18 99 %    Physical Exam Vitals and nursing note reviewed.  Constitutional:      General: She is not in acute distress.    Appearance: Normal appearance.  HENT:     Head: Normocephalic and atraumatic.  Eyes:     Conjunctiva/sclera: Conjunctivae normal.  Pulmonary:     Effort: Pulmonary effort is normal. No respiratory distress.  Abdominal:     Tenderness: There is no abdominal tenderness.     Comments: gravid  Neurological:     Mental Status: She is alert.  Psychiatric:        Mood and Affect: Mood normal.        Behavior: Behavior normal.    Bedside Ultrasound Pt informed that the ultrasound is considered a limited OB ultrasound and is not intended to be a complete ultrasound exam.  Patient also informed that the ultrasound is not being completed with the intent of assessing for fetal or placental anomalies or any pelvic abnormalities.  Explained that the purpose of today's ultrasound is to assess for  viability.  Patient acknowledges the purpose of the exam and the limitations of the study.   My interpretation: Active fetus in breech presentation. Anterior placenta. Subjectively normal fluid.   FHT Baseline 145, moderate variability, no accels, no decels Toco: flat Cat: 1  Labs Results for orders placed or performed during the hospital encounter of 09/15/23 (from the past 24 hour(s))  Protein / creatinine ratio, urine     Status: None   Collection Time: 09/15/23 11:47 AM  Result Value Ref Range   Creatinine, Urine 41 mg/dL   Total Protein, Urine <6 mg/dL   Protein Creatinine Ratio        0.00 - 0.15  mg/mg[Cre]  CBC     Status: Abnormal   Collection Time: 09/15/23 12:07 PM  Result Value Ref Range   WBC 9.0 4.0 - 10.5 K/uL   RBC 5.15 (H) 3.87 - 5.11 MIL/uL   Hemoglobin 13.3 12.0 - 15.0 g/dL   HCT 86.5 78.4 - 69.6 %   MCV 81.4 80.0 - 100.0  fL   MCH 25.8 (L) 26.0 - 34.0 pg   MCHC 31.7 30.0 - 36.0 g/dL   RDW 16.1 09.6 - 04.5 %   Platelets 227 150 - 400 K/uL   nRBC 0.0 0.0 - 0.2 %  Comprehensive metabolic panel     Status: Abnormal   Collection Time: 09/15/23 12:07 PM  Result Value Ref Range   Sodium 136 135 - 145 mmol/L   Potassium 3.9 3.5 - 5.1 mmol/L   Chloride 107 98 - 111 mmol/L   CO2 19 (L) 22 - 32 mmol/L   Glucose, Bld 108 (H) 70 - 99 mg/dL   BUN 9 6 - 20 mg/dL   Creatinine, Ser 4.09 0.44 - 1.00 mg/dL   Calcium 9.6 8.9 - 81.1 mg/dL   Total Protein 6.9 6.5 - 8.1 g/dL   Albumin 3.2 (L) 3.5 - 5.0 g/dL   AST 18 15 - 41 U/L   ALT 15 0 - 44 U/L   Alkaline Phosphatase 62 38 - 126 U/L   Total Bilirubin 0.4 0.3 - 1.2 mg/dL   GFR, Estimated >91 >47 mL/min   Anion gap 10 5 - 15    Imaging No results found.  MAU Course  Procedures Lab Orders         CBC         Comprehensive metabolic panel         Protein / creatinine ratio, urine     No orders of the defined types were placed in this encounter.  Imaging Orders  No imaging studies ordered today    MDM BSUS performed to assist with placement of EFM Labs ordered due to hypertension Outside prenatal records reviewed Assessment and Plan   1. Decreased fetal movements in second trimester, single or unspecified fetus  -BSUS performed to help with placement of EFM. Active fetus seen on ultrasound. During NST, patient reports active fetus & able to feels multiple movements. Fetal tracing appropriate for gestational age.  -Reviewed kick counts & reasons to return to MAU  2. Hypertension affecting pregnancy in second trimester  -Elevated BP in MAU. Not severe range & asymptomatic. Preeclampsia labs normal.  -Pt reports  her BP is always elevated in MAU due to poor obstetric history & stress of being here. Per chart review, she has had multiple elevations over the years <[redacted] wks gestation & non pregnant. Discussed best to err on side of caution & treat her as CHTN. Reviewed s/s of preeclampsia.  -Dr. Juliene Pina notified.   3. [redacted] weeks gestation of pregnancy      Judeth Horn, NP 09/15/23 2:16 PM

## 2023-09-15 NOTE — MAU Note (Signed)
.  Debra [name] is a 42 y.o. at [redacted]w[redacted]d here in MAU reporting: she has not felt movement since 3 am, states she had a lot of movement then and nothing since. Denies pain, bleeding or ROM.  Onset of complaint: 3am  Pain score: 0/10 There were no vitals filed for this visit.    Lab orders placed from triage:

## 2023-09-15 NOTE — MAU Note (Signed)
Provider at bedside to obtain [facility]

## 2023-09-18 ENCOUNTER — Ambulatory Visit: Payer: 59 | Admitting: Physical Therapy

## 2023-09-18 ENCOUNTER — Encounter: Payer: Self-pay | Admitting: Physical Therapy

## 2023-09-18 DIAGNOSIS — M5459 Other low back pain: Secondary | ICD-10-CM

## 2023-09-18 DIAGNOSIS — M25551 Pain in right hip: Secondary | ICD-10-CM

## 2023-09-18 DIAGNOSIS — R252 Cramp and spasm: Secondary | ICD-10-CM

## 2023-09-18 NOTE — Therapy (Signed)
OUTPATIENT PHYSICAL THERAPY TREATMENT   Patient Name: Debra [name] MRN: [id number] DOB:Jan 27, 1981, 42 y.o., female Today's Date: 09/18/2023  END OF SESSION:  PT End of Session - 09/18/23 0808     Visit Number 7    Date for PT Re-Evaluation 11/16/23    Authorization Type [id number]    PT Start Time 0804    PT Stop Time 0845    PT Time Calculation (min) 41 min    Activity Tolerance Patient tolerated treatment well    Behavior During Therapy [facility] for tasks assessed/performed             Past Medical History:  Diagnosis Date   Acne    Allergic rhinitis    Cold sore 11/02/2016   Depression    GERD (gastroesophageal reflux disease)    Hearing loss of both ears    per pt right > left,  does not wear hearing aids   IDA (iron deficiency anemia)    Incompetent cervix    Infertility, female    Mild obstructive sleep apnea    per pt had sleep study was very mild osa,  did not meet critiria for insurance to pay but pt paid out of pocket since it helps due to dust mite allergy   Prior pregnancy with incompetent cervix in second trimester, antepartum 04/01/2022   03-31-2022 pre-mature ruptured membrane [redacted]wk gestation, nonviable   Uterine polyp    Past Surgical History:  Procedure Laterality Date   CERCLAGE LAPAROSCOPIC ABDOMINAL N/A 07/27/2022   Procedure: CERCLAGE LAPAROSCOPIC CERVICO- ABDOMINAL AND LYSIS OF ADHESIONS;  Surgeon: Fermin Schwab, MD;  Location: [facility];  Service: Gynecology;  Laterality: N/A;   DILATION AND CURETTAGE OF UTERUS N/A 04/01/2022   Procedure: DILATATION AND CURETTAGE;  Surgeon: Shea Evans, MD;  Location: [facility];  Service: Gynecology;  Laterality: N/A;   HYSTEROSCOPY N/A 07/27/2022   Procedure: HYSTEROSCOPY WITH ENDOMETRIAL BIOPSY;  Surgeon: Fermin Schwab, MD;  Location: [facility];  Service: Gynecology;  Laterality: N/A;   Patient Active Problem List   Diagnosis Date Noted   Anemia of pregnancy  07/14/2023   Pregnancy 07/03/2023   Pharyngitis 09/18/2022   Well adult exam 06/08/2022   Preterm delivery 04/01/2022   Incompetent cervix in pregnancy, antepartum, second trimester 04/01/2022   Pregnancy resulting from in vitro fertilization in second trimester 03/31/2022   Premature rupture of membranes in second trimester 03/31/2022   Allergic conjunctivitis of both eyes 12/16/2020   Allergic conjunctivitis 09/29/2020   Insomnia 07/18/2018   Elevated ALT measurement 07/18/2018   Snoring 01/03/2017   Chronic low back pain without sciatica 01/03/2017   Vitamin D deficiency 11/02/2016   Morbid obesity with BMI of 40.0-44.9, adult (HCC) 11/02/2016   Dermatographia 11/02/2016   Cold sore 11/02/2016   Perennial allergic rhinitis 11/02/2016   Acne 11/02/2016   Depressive disorder 11/25/2014   Herpes simplex 12/02/2013   Esophageal reflux 12/02/2013    PCP: Everrett Coombe, DO   REFERRING PROVIDER: Olivia Mackie, MD   REFERRING DIAG: M54.50 (ICD-10-CM) - Low back pain, unspecified   Rationale for Evaluation and Treatment: Rehabilitation  THERAPY DIAG:  Other low back pain  Pain in right hip  Cramp and spasm  ONSET DATE: ~ 1 month  SUBJECTIVE:  SUBJECTIVE STATEMENT: Sleeping better, but slept on L side all weekend so its more sore.   PERTINENT HISTORY:  [redacted] weeks pregnant, h/o anemia, leukocytosis, LBP  PAIN:  Are you having pain? Yes: NPRS scale: 4/10 Pain location:  low back, R hip/glutes  Pain description: consistent dull pain in hip, acute sharp pain in back Aggravating factors: 7-8/10 at worst, laying on side Relieving factors: heating pad, massage, PT  PRECAUTIONS: Other: pregnant, no DN in lumbar/sacral region  RED FLAGS: None   WEIGHT BEARING RESTRICTIONS: No  FALLS:   Has patient fallen in last 6 months? No  LIVING ENVIRONMENT: Lives with: lives with their spouse Lives in: House/apartment Stairs: Yes: Internal: 14 steps; on right going up and on left going up and External: 1 steps; none Has following equipment at home: None  OCCUPATION: full time at [employer], but works at home most of the time.   PLOF: Independent  PATIENT GOALS: "lesses the pain, strengthen the muscles, stretch things out so not as painful"  NEXT MD VISIT: 08/25/2023  OBJECTIVE:   DIAGNOSTIC FINDINGS:  No relevant imaging  PATIENT SURVEYS:  Modified Oswestry 21/50   COGNITION: Overall cognitive status: Within functional limits for tasks assessed     SENSATION: WFL  MUSCLE LENGTH: Hamstrings: Right 90 deg; Left 90 deg  POSTURE: No Significant postural limitations  PALPATION: Tenderness L4/L5, bil QL, glutes and piriformis, SIJ, greater trochanters, Right worse than left.   LUMBAR ROM:   AROM eval  Flexion   Extension   Right lateral flexion   Left lateral flexion   Right rotation   Left rotation    (Blank rows = not tested)  LOWER EXTREMITY ROM:   good hip mobility bilaterally for both hip internal and external rotation.    LOWER EXTREMITY MMT:    MMT Right eval Left eval  Hip flexion 4+ 4+  Hip extension    Hip abduction 5 (in SL) 5 (in SL)  Hip adduction 5 5  Knee flexion 5 5  Knee extension 5 5  Ankle dorsiflexion 5 5  Ankle plantarflexion     (Blank rows = not tested)  LUMBAR SPECIAL TESTS:  Straight leg raise test: Negative and FABER test: Negative  FUNCTIONAL TESTS:    GAIT: Distance walked: 65' Assistive device utilized: None Level of assistance: Complete Independence Comments: no significant deviation or device  TODAY'S TREATMENT:                                                                                                                              DATE:   09/18/23 Therapeutic Exercise: to improve strength and  mobility.  Demo, verbal and tactile cues throughout for technique. Nustep L5 x 5 min LE only  Manual Therapy: to decrease muscle spasm and pain and improve mobility STM/TPR to L QL, bil glute med and max, piriformis, QL, skilled palpation and monitoring during dry needling. Trigger Point Dry-Needling  Treatment instructions: Expect  mild to moderate muscle soreness. S/S of pneumothorax if dry needled over a lung field, and to seek immediate medical attention should they occur. Patient verbalized understanding of these instructions and education. Patient Consent Given: Yes Education handout provided: Yes Muscles treated: bil glute med and piriformis Electrical stimulation performed: No Parameters: N/A Treatment response/outcome: Twitch Response Elicited and Palpable Increase in Muscle Length   09/14/23 Manual Therapy: to decrease muscle spasm and pain and improve mobility STM/TPR to bil glute med and max, piriformis, QL, skilled palpation and monitoring during dry needling. Trigger Point Dry-Needling  Treatment instructions: Expect mild to moderate muscle soreness. S/S of pneumothorax if dry needled over a lung field, and to seek immediate medical attention should they occur. Patient verbalized understanding of these instructions and education. Patient Consent Given: Yes Education handout provided: Yes Muscles treated: bil glute med and piriformis Electrical stimulation performed: No Parameters: N/A Treatment response/outcome: Twitch Response Elicited and Palpable Increase in Muscle Length    09/11/23 Therapeutic Exercise: to improve strength and mobility.  Demo, verbal and tactile cues throughout for technique. Nustep L5 x 3 min Review of HEP, seated pelvic tilts Self Care: Education on different positioning in bed with pillows and strategies for rolling over to help with pain.  Manual Therapy: to decrease muscle spasm and pain and improve mobility STM/TPR to bil glute med and max,  piriformis, QL, skilled palpation and monitoring during dry needling. Trigger Point Dry-Needling  Treatment instructions: Expect mild to moderate muscle soreness. S/S of pneumothorax if dry needled over a lung field, and to seek immediate medical attention should they occur. Patient verbalized understanding of these instructions and education. Patient Consent Given: Yes Education handout provided: Yes Muscles treated: bil glute med and piriformis Electrical stimulation performed: No Parameters: N/A Treatment response/outcome: Twitch Response Elicited and Palpable Increase in Muscle Length  09/07/23 Therapeutic Exercise: to improve strength and mobility.  Demo, verbal and tactile cues throughout for technique. Nustep L5 x 3 min On physioball- pelvic circles and tilts.  Ball placed in corner for safety with chair with arm next to it to assist with transferring safely.  Squats x 10 Manual Therapy: to decrease muscle spasm and pain and improve mobility STM/TPR to bil glute med and max, piriformis, skilled palpation and monitoring during dry needling. Trigger Point Dry-Needling  Treatment instructions: Expect mild to moderate muscle soreness. S/S of pneumothorax if dry needled over a lung field, and to seek immediate medical attention should they occur. Patient verbalized understanding of these instructions and education. Patient Consent Given: Yes Education handout provided: Yes Muscles treated: bil glute med and piriformis Electrical stimulation performed: No Parameters: N/A Treatment response/outcome: Twitch Response Elicited and Palpable Increase in Muscle Length   PATIENT EDUCATION:  Education details: continue HEP Person educated: Patient Education method: Explanation Education comprehension: verbalized understanding  HOME EXERCISE PROGRAM: Access Code: [email or web address] URL: [email or web address] Date: 08/28/2023 Prepared by: Harrie Foreman  Exercises - Supine  Diaphragmatic Breathing  - 1 x daily - 7 x weekly - 1 sets - 10 reps - Seated Diaphragmatic Breathing  - 1 x daily - 7 x weekly - 1 sets - 10 reps - Supine Posterior Pelvic Tilt  - 1 x daily - 7 x weekly - 1 sets - 10 reps - Seated Pelvic Tilt  - 1 x daily - 7 x weekly - 1 sets - 10 reps - Posterior Pelvic Tilt with Adduction Using Pillow in Hooklying  - 1 x daily - 7 x weekly - 1 sets -  10 reps - Bent Knee Fallouts  - 1 x daily - 7 x weekly - 1 sets - 10 reps - Supine Posterior Pelvic Tilt with Knee Rocks  - 1 x daily - 7 x weekly - 1 sets - 10 reps  ASSESSMENT:  CLINICAL IMPRESSION: Continued to focus on TrDN to glutes with strong twitch response bil, reporting decreased tightness and pain following.  Noted trigger points in L QL today which responded well to TPR.  Decreased pain reported after interventions.  Debra [name] continues to demonstrate potential for improvement and would benefit from continued skilled therapy to address impairments.       OBJECTIVE IMPAIRMENTS: decreased activity tolerance, increased fascial restrictions, increased muscle spasms, and pain.   ACTIVITY LIMITATIONS: carrying, lifting, bending, sleeping, bed mobility, and locomotion level  PARTICIPATION LIMITATIONS: meal prep, cleaning, laundry, shopping, and community activity  PERSONAL FACTORS: Past/current experiences and 1-2 comorbidities: history of LBP, pregnancy, obesity   are also affecting patient's functional outcome.   REHAB POTENTIAL: Good  CLINICAL DECISION MAKING: Evolving/moderate complexity  EVALUATION COMPLEXITY: Moderate   GOALS: Goals reviewed with patient? Yes  SHORT TERM GOALS: Target date: 09/07/2023   Patient will be independent with initial HEP.  Baseline:  Given 08/28/23 Goal status: MET 09/04/23   LONG TERM GOALS: Target date: 11/16/2023   Patient will be independent with advanced/ongoing HEP to improve outcomes and carryover.  Baseline:   Goal status: IN  PROGRESS  2.  Patient will report 50% improvement in low back pain/R hip pain to improve QOL.  Baseline:  Goal status: IN PROGRESS  3.  Patient will demonstrate full pain free lumbar ROM to perform ADLs.   Baseline:  Goal status: IN PROGRESS  4.  Patient will report at least 6 points improvement on modified Oswestry to demonstrate improved functional ability.  Baseline: 21/50 Goal status: IN PROGRESS   5. Patient will report 50% improvement in sleep quality  Baseline: increased pain at night preventing sleep Goal status: IN PROGRESS   PLAN:  PT FREQUENCY: 2x/week for 6-8 weeks decreasing to 1x per week when appropriate for 12 weeks total  PT DURATION: 12 weeks  PLANNED INTERVENTIONS: Therapeutic exercises, Therapeutic activity, Neuromuscular re-education, Balance training, Gait training, Patient/Family education, Self Care, Joint mobilization, Dry Needling, Electrical stimulation, Spinal mobilization, Cryotherapy, Moist heat, Manual therapy, and Re-evaluation.  PLAN FOR NEXT SESSION: core stabilization exercises, can lie on back for short periods but try in semi-reclined, manual therapy, TrDN to glutes/TFL only, no needling in spine region.  Consider Ktape to abdomen also.     Jena Gauss, PT, DPT  09/18/2023, 10:33 AM

## 2023-09-19 ENCOUNTER — Ambulatory Visit (INDEPENDENT_AMBULATORY_CARE_PROVIDER_SITE_OTHER): Payer: 59 | Admitting: Family Medicine

## 2023-09-19 ENCOUNTER — Encounter: Payer: Self-pay | Admitting: Family Medicine

## 2023-09-19 VITALS — BP 146/67 | HR 94 | Temp 98.2°F | Ht 65.0 in | Wt 301.0 lb

## 2023-09-19 DIAGNOSIS — G4739 Other sleep apnea: Secondary | ICD-10-CM | POA: Diagnosis not present

## 2023-09-19 DIAGNOSIS — Z7689 Persons encountering health services in other specified circumstances: Secondary | ICD-10-CM

## 2023-09-19 DIAGNOSIS — Z3A25 25 weeks gestation of pregnancy: Secondary | ICD-10-CM

## 2023-09-19 DIAGNOSIS — R030 Elevated blood-pressure reading, without diagnosis of hypertension: Secondary | ICD-10-CM | POA: Diagnosis not present

## 2023-09-19 LAB — CBC WITH DIFFERENTIAL/PLATELET
Basophils Absolute: 0 10*3/uL (ref 0.0–0.1)
Basophils Relative: 0.3 % (ref 0.0–3.0)
Eosinophils Absolute: 0.1 10*3/uL (ref 0.0–0.7)
Eosinophils Relative: 0.7 % (ref 0.0–5.0)
HCT: 39.3 % (ref 36.0–46.0)
Hemoglobin: 12.3 g/dL (ref 12.0–15.0)
Lymphocytes Relative: 28.2 % (ref 12.0–46.0)
Lymphs Abs: 2.7 10*3/uL (ref 0.7–4.0)
MCHC: 31.4 g/dL (ref 30.0–36.0)
MCV: 82.8 fL (ref 78.0–100.0)
Monocytes Absolute: 0.6 10*3/uL (ref 0.1–1.0)
Monocytes Relative: 5.9 % (ref 3.0–12.0)
Neutro Abs: 6.3 10*3/uL (ref 1.4–7.7)
Neutrophils Relative %: 64.9 % (ref 43.0–77.0)
Platelets: 192 10*3/uL (ref 150.0–400.0)
RBC: 4.74 Mil/uL (ref 3.87–5.11)
RDW: 14.1 % (ref 11.5–15.5)
WBC: 9.6 10*3/uL (ref 4.0–10.5)

## 2023-09-19 NOTE — Progress Notes (Signed)
Recheck cbc New cpap to [facility]

## 2023-09-19 NOTE — Progress Notes (Signed)
New Patient Office Visit  Subjective    Patient ID: Debra [name], female    DOB: 26-Aug-1981  Age: 42 y.o. MRN: [id number]  CC: establish care   HPI Debra [name] presents to establish care   Discussed the use of AI scribe software for clinical note transcription with the patient, who gave verbal consent to proceed.  History of Present Illness   The patient, a 25-week pregnant individual with a history of IVF treatment, presents with a recent onset of elevated blood pressure. They report no prior history of hypertension. The patient has been monitoring their blood pressure at home, noting readings in the 140s and 150s, which is significantly higher than their usual range of high teens to 120s. They deny any associated symptoms such as headaches or vision changes. They deny any swelling in the feet.  The patient has a history of low iron levels, which were attributed to significant blood loss during a preterm labor event last year. They received an iron infusion to address this issue. They also mention a prior sleep study, which indicated a mild sleep apnea. The patient has been using a CPAP machine for about eight years and is seeking a new prescription for the same. She is aware insurance may not cover, and she wants to be self-pay.  The patient also reports a history of hearing loss, which they attribute to a genetic predisposition. They mention occasional redness in the ears and a slight worsening of hearing, but deny any pain or other symptoms associated with ear infections.  The patient is currently on a regimen of baby aspirin, vitamin D2 and D3, and several vitamins. They have not yet received the COVID-19 vaccine and express concerns about potential risks due to their pregnancy and IVF treatment. she is planning to discuss with her OB tomorrow.   They also express a desire to have their white blood cell count checked, as a recent test showed it to be on the  higher end of the normal range.               Outpatient Encounter Medications as of 09/19/2023  Medication Sig   [DISCONTINUED] DOTTI 0.1 MG/24HR patch Place 1 patch onto the skin 2 (two) times a week.   aspirin EC (ASPIRIN LOW DOSE) 81 MG tablet    Azelaic Acid 15 % gel APPLY 1 APPLICATION TOPICALLY TWICE A DAY TO FACE   calcium citrate (CALCITRATE - DOSED IN MG ELEMENTAL CALCIUM) 950 (200 Ca) MG tablet Take 4,200 mg of elemental calcium by mouth daily.   Cholecalciferol (VITAMIN D3) 125 MCG (5000 UT) CAPS Take 1 capsule by mouth daily.   diphenhydramine-acetaminophen (TYLENOL PM) 25-500 MG TABS tablet Take 1 tablet by mouth at bedtime as needed.   Ergocalciferol (VITAMIN D2 PO) Take 1,000 Int'l Units/day by mouth daily.   lansoprazole (PREVACID) 15 MG capsule Take 15 mg by mouth daily at 12 noon.   loratadine (CLARITIN) 10 MG tablet Take 10 mg by mouth daily.   Omega-3 Fatty Acids (OMEGA-3 FISH OIL PO) Take 5,000 mg by mouth daily.   OVER THE COUNTER MEDICATION Take 1 tablet by mouth daily. Lions' mane supplement (mushroom extract)  2000 mg   Prenatal MV-Min-Fe Fum-FA-DHA (PRENATAL+DHA PO)    Prenatal Vit-Fe Fumarate-FA (PRENATAL MULTIVITAMIN) TABS tablet Take 1 tablet by mouth daily at 12 noon.   PSYLLIUM FIBER PO Take 3,000 mg by mouth daily.   Riboflavin (B-2-400 PO) Take 1 tablet by mouth daily.  Vitamin A 3 MG (10000 UT) TABS Take 1 tablet by mouth daily.   No facility-administered encounter medications on file as of 09/19/2023.    Past Medical History:  Diagnosis Date   Acne    Allergic rhinitis    Cold sore 11/02/2016   Depression    GERD (gastroesophageal reflux disease)    Hearing loss of both ears    per pt right > left,  does not wear hearing aids   IDA (iron deficiency anemia)    Incompetent cervix    Infertility, female    Mild obstructive sleep apnea    per pt had sleep study was very mild osa,  did not meet critiria for insurance to pay but pt paid out  of pocket since it helps due to dust mite allergy   Prior pregnancy with incompetent cervix in second trimester, antepartum 04/01/2022   03-31-2022 pre-mature ruptured membrane [redacted]wk gestation, nonviable   Uterine polyp     Past Surgical History:  Procedure Laterality Date   CERCLAGE LAPAROSCOPIC ABDOMINAL N/A 07/27/2022   Procedure: CERCLAGE LAPAROSCOPIC CERVICO- ABDOMINAL AND LYSIS OF ADHESIONS;  Surgeon: Fermin Schwab, MD;  Location: [facility];  Service: Gynecology;  Laterality: N/A;   DILATION AND CURETTAGE OF UTERUS N/A 04/01/2022   Procedure: DILATATION AND CURETTAGE;  Surgeon: Shea Evans, MD;  Location: [facility];  Service: Gynecology;  Laterality: N/A;   HYSTEROSCOPY N/A 07/27/2022   Procedure: HYSTEROSCOPY WITH ENDOMETRIAL BIOPSY;  Surgeon: Fermin Schwab, MD;  Location: [facility];  Service: Gynecology;  Laterality: N/A;    Family History  Problem Relation Age of Onset   Breast cancer Mother    Allergic rhinitis Mother    Eczema Mother    Allergic rhinitis Father    Allergic rhinitis Brother     Social History   Socioeconomic History   Marital status: Married    Spouse name: Not on file   Number of children: Not on file   Years of education: Not on file   Highest education level: Not on file  Occupational History   Not on file  Tobacco Use   Smoking status: Never   Smokeless tobacco: Never  Vaping Use   Vaping status: Never Used  Substance and Sexual Activity   Alcohol use: Yes    Comment: occasional   Drug use: Never   Sexual activity: Yes    Partners: Male    Birth control/protection: Condom  Other Topics Concern   Not on file  Social History Narrative   Not on file   Social Determinants of Health   Financial Resource Strain: Not on file  Food Insecurity: Not on file  Transportation Needs: Not on file  Physical Activity: Not on file  Stress: Not on file  Social Connections: Not on file  Intimate Partner  Violence: Not on file    ROS All review of systems negative except what is listed in the HPI      Objective    BP (!) 146/67   Pulse 94   Temp 98.2 F (36.8 C)   Ht 5\' 5"  (1.651 m)   Wt (!) 301 lb (136.5 kg)   LMP 10/07/2022 (Exact Date)   SpO2 99%   BMI 50.09 kg/m   Physical Exam Vitals reviewed.  Constitutional:      Appearance: Normal appearance.  HENT:     Ears:     Comments: Minimal erythema in bilateral canals, no bulging TMs Cardiovascular:  Rate and Rhythm: Normal rate and regular rhythm.     Heart sounds: Normal heart sounds.  Pulmonary:     Effort: Pulmonary effort is normal.     Breath sounds: Normal breath sounds.  Musculoskeletal:     Right lower leg: No edema.     Left lower leg: No edema.  Skin:    General: Skin is warm and dry.  Neurological:     Mental Status: She is alert and oriented to person, place, and time.  Psychiatric:        Mood and Affect: Mood normal.        Behavior: Behavior normal.        Thought Content: Thought content normal.        Judgment: Judgment normal.         Assessment & Plan:   Problem List Items Addressed This Visit       Active Problems   Pregnancy   Relevant Orders   CBC with Differential/Platelet   Sleep apnea-like behavior    Patient has a history of sleep apnea and uses CPAP machine regularly. No recent changes in symptoms. -Continue using CPAP machine as directed.       Relevant Orders   CBC with Differential/Platelet   For home use only DME continuous positive airway pressure (CPAP)   Other Visit Diagnoses     Encounter to establish care    -  Primary   Elevated blood pressure reading          Pregnancy with Hypertension New onset hypertension during pregnancy. No history of hypertension prior to pregnancy. No symptoms of preeclampsia. Patient to follow up with OB/GYN tomorrow for further management. -Continue monitoring blood pressure at home. -OB/GYN appointment tomorrow.   -Lifestyle measures discussed.      Return in about 6 months (around 03/19/2024) for routine follow-up.   Clayborne Dana, NP

## 2023-09-19 NOTE — Assessment & Plan Note (Signed)
Patient has a history of sleep apnea and uses CPAP machine regularly. No recent changes in symptoms. -Continue using CPAP machine as directed.

## 2023-09-19 NOTE — Patient Instructions (Signed)
Thank you for choosing [facility] for your Primary Care needs. I am excited for the opportunity to partner with you to meet your health care goals. It was a pleasure meeting you today!  Information on diet, exercise, and health maintenance recommendations are listed below. This is information to help you be sure you are on track for optimal health and monitoring.   Please look over this and let us know if you have any questions or if you have completed any of the health maintenance outside of [place] so that we can be sure your records are up to date.  ___________________________________________________________  MyChart:  For all urgent or time sensitive needs we ask that you please call the office to avoid delays. Our number is [phone]. MyChart is not constantly monitored and due to the large volume of messages a day, replies may take up to 72 business hours.  MyChart Policy: MyChart allows for you to see your visit notes, after visit summary, provider recommendations, lab and tests results, make an appointment, request refills, and contact your provider or the office for non-urgent questions or concerns. Providers are seeing patients during normal business hours and do not have built in time to review MyChart messages.  We ask that you allow a minimum of 3 business days for responses to MyChart messages. For this reason, please do not send urgent requests through MyChart. Please call the office at [phone]. New and ongoing conditions may require a visit. We have virtual and in-person visits available for your convenience.  Complex MyChart concerns may require a visit. Your provider may request you schedule a virtual or in-person visit to ensure we are providing the best care possible. MyChart messages sent after 11:00 AM on Friday will not be received by the provider until Monday morning.    Lab and Test Results: You will receive your lab and test  results on MyChart as soon as they are completed and results have been sent by the lab or testing facility. Due to this service, you will receive your results BEFORE your provider.  I review lab and test results each morning prior to seeing patients. Some results require collaboration with other providers to ensure you are receiving the most appropriate care. For this reason, we ask that you please allow a minimum of 3-5 business days from the time that ALL results have been received for your provider to receive and review lab and test results and contact you about these.  Most lab and test result comments from the provider will be sent through MyChart. Your provider may recommend changes to the plan of care, follow-up visits, repeat testing, ask questions, or request an office visit to discuss these results. You may reply directly to this message or call the office to provide information for the provider or set up an appointment. In some instances, you will be called with test results and recommendations. Please let us know if this is preferred and we will make note of this in your chart to provide this for you.    If you have not heard a response to your lab or test results in 5 business days from all results returning to MyChart, please call the office to let us know. We ask that you please avoid calling prior to this time unless there is an emergent concern. Due to high call volumes, this can delay the resulting process.  After Hours: For all non-emergency after hours needs, please   call the office at [phone] and select the option to reach the on-call  service. On-call services are shared between multiple [facility] offices and therefore it will not be possible to speak directly with your provider. On-call providers may provide medical advice and recommendations, but are unable to provide refills for maintenance medications.  For all emergency or urgent medical needs after normal business hours, we  recommend that you seek care at the closest Urgent Care or Emergency Department to ensure appropriate treatment in a timely manner.  [facility] has a 24 hour emergency room located on the ground floor for your convenience.   Urgent Concerns During the Business Day Providers are seeing patients from 8AM to 5PM with a busy schedule and are most often not able to respond to non-urgent calls until the end of the day or the next business day. If you should have URGENT concerns during the day, please call and speak to the nurse or schedule a same day appointment so that we can address your concern without delay.   Thank you, again, for choosing me as your health care partner. I appreciate your trust and look forward to learning more about you!   Darcey Demma B. Nhi Butrum, DNP, FNP-C  ___________________________________________________________  Health Maintenance Recommendations Screening Testing Mammogram Every 1-2 years based on history and risk factors Starting at age 50 Pap Smear Ages 21-39 every 3 years Ages 30-65 every 5 years with HPV testing More frequent testing may be required based on results and history Colon Cancer Screening Every 1-10 years based on test performed, risk factors, and history Starting at age 45 Bone Density Screening Every 2-10 years based on history Starting at age 65 for women Recommendations for men differ based on medication usage, history, and risk factors AAA Screening One time ultrasound Men 65-75 years old who have ever smoked Lung Cancer Screening Low Dose Lung CT every 12 months Age 50-80 years with a 20 pack-year smoking history who still smoke or who have quit within the last 15 years  Screening Labs Routine  Labs: Complete Blood Count (CBC), Complete Metabolic Panel (CMP), Cholesterol (Lipid Panel) Every 6-12 months based on history and medications May be recommended more frequently based on current conditions or previous results Hemoglobin  A1c Lab Every 3-12 months based on history and previous results Starting at age 45 or earlier with diagnosis of diabetes, high cholesterol, BMI >26, and/or risk factors Frequent monitoring for patients with diabetes to ensure blood sugar control Thyroid Panel  Every 6 months based on history, symptoms, and risk factors May be repeated more often if on medication HIV One time testing for all patients 13 and older May be repeated more frequently for patients with increased risk factors or exposure Hepatitis C One time testing for all patients 18 and older May be repeated more frequently for patients with increased risk factors or exposure Gonorrhea, Chlamydia Every 12 months for all sexually active persons 13-24 years Additional monitoring may be recommended for those who are considered high risk or who have symptoms PSA Men 40-54 years old with risk factors Additional screening may be recommended from age 55-69 based on risk factors, symptoms, and history  Vaccine Recommendations Tetanus Booster All adults every 10 years Flu Vaccine All patients 6 months and older every year COVID Vaccine All patients 12 years and older Initial dosing with booster May recommend additional booster based on age and health history HPV Vaccine 2 doses all patients age 9-26 Dosing may be considered   for patients over 26 Shingles Vaccine (Shingrix) 2 doses all adults 50 years and older Pneumonia (Pneumovax 23) All adults 65 years and older May recommend earlier dosing based on health history Pneumonia (Prevnar 13) All adults 65 years and older Dosed 1 year after Pneumovax 23 Pneumonia (Prevnar 20) All adults 65 years and older (adults 19-64 with certain conditions or risk factors) 1 dose  For those who have not received Prevnar 13 vaccine previously   Additional Screening, Testing, and Vaccinations may be recommended on an individualized basis based on family history, health history, risk  factors, and/or exposure.  __________________________________________________________  Diet Recommendations for All Patients  I recommend that all patients maintain a diet low in saturated fats, carbohydrates, and cholesterol. While this can be challenging at first, it is not impossible and small changes can make big differences.  Things to try: Decreasing the amount of soda, sweet tea, and/or juice to one or less per day and replace with water While water is always the first choice, if you do not like water you may consider adding a water additive without sugar to improve the taste other sugar free drinks Replace potatoes with a brightly colored vegetable  Use healthy oils, such as canola oil or olive oil, instead of butter or hard margarine Limit your bread intake to two pieces or less a day Replace regular pasta with low carb pasta options Bake, broil, or grill foods instead of frying Monitor portion sizes  Eat smaller, more frequent meals throughout the day instead of large meals  An important thing to remember is, if you love foods that are not great for your health, you don't have to give them up completely. Instead, allow these foods to be a reward when you have done well. Allowing yourself to still have special treats every once in a while is a nice way to tell yourself thank you for working hard to keep yourself healthy.   Also remember that every day is a new day. If you have a bad day and "fall off the wagon", you can still climb right back up and keep moving along on your journey!  We have resources available to help you!  Some websites that may be helpful include: www.MyPlate.gov  Www.VeryWellFit.com _____________________________________________________________  Activity Recommendations for All Patients  I recommend that all adults get at least 20 minutes of moderate physical activity that elevates your heart rate at least 5 days out of the week.  Some examples  include: Walking or jogging at a pace that allows you to carry on a conversation Cycling (stationary bike or outdoors) Water aerobics Yoga Weight lifting Dancing If physical limitations prevent you from putting stress on your joints, exercise in a pool or seated in a chair are excellent options.  Do determine your MAXIMUM heart rate for activity: 220 - YOUR AGE = MAX Heart Rate   Remember! Do not push yourself too hard.  Start slowly and build up your pace, speed, weight, time in exercise, etc.  Allow your body to rest between exercise and get good sleep. You will need more water than normal when you are exerting yourself. Do not wait until you are thirsty to drink. Drink with a purpose of getting in at least 8, 8 ounce glasses of water a day plus more depending on how much you exercise and sweat.    If you begin to develop dizziness, chest pain, abdominal pain, jaw pain, shortness of breath, headache, vision changes, lightheadedness, or other concerning symptoms,   stop the activity and allow your body to rest. If your symptoms are severe, seek emergency evaluation immediately. If your symptoms are concerning, but not severe, please let us know so that we can recommend further evaluation.     

## 2023-09-20 ENCOUNTER — Telehealth: Payer: Self-pay | Admitting: Neurology

## 2023-09-20 NOTE — Telephone Encounter (Signed)
CPAP order faxed to [facility] with last office note and recent sleep study. Faxed to [phone] with confirmation received.

## 2023-09-25 ENCOUNTER — Ambulatory Visit: Payer: 59 | Attending: Obstetrics and Gynecology | Admitting: Physical Therapy

## 2023-09-25 ENCOUNTER — Encounter: Payer: Self-pay | Admitting: Physical Therapy

## 2023-09-25 ENCOUNTER — Telehealth: Payer: Self-pay

## 2023-09-25 DIAGNOSIS — M5459 Other low back pain: Secondary | ICD-10-CM | POA: Diagnosis present

## 2023-09-25 DIAGNOSIS — G4739 Other sleep apnea: Secondary | ICD-10-CM

## 2023-09-25 DIAGNOSIS — R252 Cramp and spasm: Secondary | ICD-10-CM | POA: Diagnosis present

## 2023-09-25 DIAGNOSIS — M25551 Pain in right hip: Secondary | ICD-10-CM | POA: Insufficient documentation

## 2023-09-25 NOTE — Telephone Encounter (Signed)
Spoke with Debra [name] from [facility], he is requesting a new order be placed for pts CPAP machine. Debra [name] states the type of setting for auto thyration needs to be listed.

## 2023-09-25 NOTE — Addendum Note (Signed)
Addended by: Hyman Hopes B on: 09/25/2023 05:21 PM   Modules accepted: Orders

## 2023-09-25 NOTE — Therapy (Signed)
OUTPATIENT PHYSICAL THERAPY TREATMENT   Patient Name: Debra [name] MRN: [id number] DOB:1981-09-09, 42 y.o., female Today's Date: 09/25/2023  END OF SESSION:  PT End of Session - 09/25/23 0804     Visit Number 8    Date for PT Re-Evaluation 11/16/23    Authorization Type UHC [id number]    PT Start Time 0802    PT Stop Time 0845    PT Time Calculation (min) 43 min    Activity Tolerance Patient tolerated treatment well    Behavior During Therapy [facility] for tasks assessed/performed             Past Medical History:  Diagnosis Date   Acne    Allergic rhinitis    Cold sore 11/02/2016   Depression    GERD (gastroesophageal reflux disease)    Hearing loss of both ears    per pt right > left,  does not wear hearing aids   IDA (iron deficiency anemia)    Incompetent cervix    Infertility, female    Mild obstructive sleep apnea    per pt had sleep study was very mild osa,  did not meet critiria for insurance to pay but pt paid out of pocket since it helps due to dust mite allergy   Prior pregnancy with incompetent cervix in second trimester, antepartum 04/01/2022   03-31-2022 pre-mature ruptured membrane [redacted]wk gestation, nonviable   Uterine polyp    Past Surgical History:  Procedure Laterality Date   CERCLAGE LAPAROSCOPIC ABDOMINAL N/A 07/27/2022   Procedure: CERCLAGE LAPAROSCOPIC CERVICO- ABDOMINAL AND LYSIS OF ADHESIONS;  Surgeon: Fermin Schwab, MD;  Location: [facility];  Service: Gynecology;  Laterality: N/A;   DILATION AND CURETTAGE OF UTERUS N/A 04/01/2022   Procedure: DILATATION AND CURETTAGE;  Surgeon: Shea Evans, MD;  Location: [facility];  Service: Gynecology;  Laterality: N/A;   HYSTEROSCOPY N/A 07/27/2022   Procedure: HYSTEROSCOPY WITH ENDOMETRIAL BIOPSY;  Surgeon: Fermin Schwab, MD;  Location: [facility];  Service: Gynecology;  Laterality: N/A;   Patient Active Problem List   Diagnosis Date Noted   Sleep apnea-like  behavior 09/19/2023   Anemia of pregnancy 07/14/2023   Pregnancy 07/03/2023   Pharyngitis 09/18/2022   Well adult exam 06/08/2022   Preterm delivery 04/01/2022   Incompetent cervix in pregnancy, antepartum, second trimester 04/01/2022   Pregnancy resulting from in vitro fertilization in second trimester 03/31/2022   Premature rupture of membranes in second trimester 03/31/2022   Allergic conjunctivitis of both eyes 12/16/2020   Allergic conjunctivitis 09/29/2020   Insomnia 07/18/2018   Elevated ALT measurement 07/18/2018   Snoring 01/03/2017   Chronic low back pain without sciatica 01/03/2017   Vitamin D deficiency 11/02/2016   Morbid obesity with BMI of 40.0-44.9, adult (HCC) 11/02/2016   Dermatographia 11/02/2016   Cold sore 11/02/2016   Perennial allergic rhinitis 11/02/2016   Acne 11/02/2016   Depressive disorder 11/25/2014   Herpes simplex 12/02/2013   Esophageal reflux 12/02/2013    PCP: Clayborne Dana, NP   REFERRING PROVIDER: Olivia Mackie, MD   REFERRING DIAG: M54.50 (ICD-10-CM) - Low back pain, unspecified   Rationale for Evaluation and Treatment: Rehabilitation  THERAPY DIAG:  Other low back pain  Pain in right hip  Cramp and spasm  ONSET DATE: ~ 1 month  SUBJECTIVE:  SUBJECTIVE STATEMENT: Lower back was tight this weekend.  Feels like its getting better just gets little flare ups.  At 26 weeks now.  Getting a little hypertension now, so on a new blood pressure medication.   PERTINENT HISTORY:  [redacted] weeks pregnant, h/o anemia, leukocytosis, LBP  PAIN:  Are you having pain? Yes: NPRS scale: 5/10 Pain location:  low back, R hip/glutes  Pain description: consistent dull pain in hip, acute sharp pain in back Aggravating factors: 7-8/10 at worst, laying on side Relieving  factors: heating pad, massage, PT  PRECAUTIONS: Other: pregnant, no DN in lumbar/sacral region  RED FLAGS: None   WEIGHT BEARING RESTRICTIONS: No  FALLS:  Has patient fallen in last 6 months? No  LIVING ENVIRONMENT: Lives with: lives with their spouse Lives in: House/apartment Stairs: Yes: Internal: 14 steps; on right going up and on left going up and External: 1 steps; none Has following equipment at home: None  OCCUPATION: full time at [employer], but works at home most of the time.   PLOF: Independent  PATIENT GOALS: "lesses the pain, strengthen the muscles, stretch things out so not as painful"  NEXT MD VISIT: 08/25/2023  OBJECTIVE:   DIAGNOSTIC FINDINGS:  No relevant imaging  PATIENT SURVEYS:  Modified Oswestry 21/50   COGNITION: Overall cognitive status: Within functional limits for tasks assessed     SENSATION: WFL  MUSCLE LENGTH: Hamstrings: Right 90 deg; Left 90 deg  POSTURE: No Significant postural limitations  PALPATION: Tenderness L4/L5, bil QL, glutes and piriformis, SIJ, greater trochanters, Right worse than left.   LUMBAR ROM:  NT  LOWER EXTREMITY ROM:   good hip mobility bilaterally for both hip internal and external rotation.    LOWER EXTREMITY MMT:    MMT Right eval Left eval  Hip flexion 4+ 4+  Hip extension    Hip abduction 5 (in SL) 5 (in SL)  Hip adduction 5 5  Knee flexion 5 5  Knee extension 5 5  Ankle dorsiflexion 5 5  Ankle plantarflexion     (Blank rows = not tested)  LUMBAR SPECIAL TESTS:  Straight leg raise test: Negative and FABER test: Negative  FUNCTIONAL TESTS:    GAIT: Distance walked: 22' Assistive device utilized: None Level of assistance: Complete Independence Comments: no significant deviation or device  TODAY'S TREATMENT:                                                                                                                              DATE:   09/25/23 Therapeutic Exercise: to improve  strength and mobility.  Demo, verbal and tactile cues throughout for technique. Nustep L5 x 5 min LE only  Manual Therapy: to decrease muscle spasm and pain and improve mobility STM/TPR to bil QL, bil glute med and max, piriformis,  skilled palpation and monitoring during dry needling. Trigger Point Dry-Needling  Treatment instructions: Expect mild to moderate muscle soreness. S/S of pneumothorax if dry needled  over a lung field, and to seek immediate medical attention should they occur. Patient verbalized understanding of these instructions and education. Patient Consent Given: Yes Education handout provided: Yes Muscles treated: bil glute med and piriformis Electrical stimulation performed: No Parameters: N/A Treatment response/outcome: Twitch Response Elicited and Palpable Increase in Muscle Length  09/18/23 Therapeutic Exercise: to improve strength and mobility.  Demo, verbal and tactile cues throughout for technique. Nustep L5 x 5 min LE only  Manual Therapy: to decrease muscle spasm and pain and improve mobility STM/TPR to L QL, bil glute med and max, piriformis, QL, skilled palpation and monitoring during dry needling. Trigger Point Dry-Needling  Treatment instructions: Expect mild to moderate muscle soreness. S/S of pneumothorax if dry needled over a lung field, and to seek immediate medical attention should they occur. Patient verbalized understanding of these instructions and education. Patient Consent Given: Yes Education handout provided: Yes Muscles treated: bil glute med and piriformis Electrical stimulation performed: No Parameters: N/A Treatment response/outcome: Twitch Response Elicited and Palpable Increase in Muscle Length   09/14/23 Manual Therapy: to decrease muscle spasm and pain and improve mobility STM/TPR to bil glute med and max, piriformis, QL, skilled palpation and monitoring during dry needling. Trigger Point Dry-Needling  Treatment instructions: Expect mild  to moderate muscle soreness. S/S of pneumothorax if dry needled over a lung field, and to seek immediate medical attention should they occur. Patient verbalized understanding of these instructions and education. Patient Consent Given: Yes Education handout provided: Yes Muscles treated: bil glute med and piriformis Electrical stimulation performed: No Parameters: N/A Treatment response/outcome: Twitch Response Elicited and Palpable Increase in Muscle Length  PATIENT EDUCATION:  Education details: continue HEP Person educated: Patient Education method: Explanation Education comprehension: verbalized understanding  HOME EXERCISE PROGRAM: Access Code: [email or web address] URL: [email or web address] Date: 08/28/2023 Prepared by: Harrie Foreman  Exercises - Supine Diaphragmatic Breathing  - 1 x daily - 7 x weekly - 1 sets - 10 reps - Seated Diaphragmatic Breathing  - 1 x daily - 7 x weekly - 1 sets - 10 reps - Supine Posterior Pelvic Tilt  - 1 x daily - 7 x weekly - 1 sets - 10 reps - Seated Pelvic Tilt  - 1 x daily - 7 x weekly - 1 sets - 10 reps - Posterior Pelvic Tilt with Adduction Using Pillow in Hooklying  - 1 x daily - 7 x weekly - 1 sets - 10 reps - Bent Knee Fallouts  - 1 x daily - 7 x weekly - 1 sets - 10 reps - Supine Posterior Pelvic Tilt with Knee Rocks  - 1 x daily - 7 x weekly - 1 sets - 10 reps  ASSESSMENT:  CLINICAL IMPRESSION: Debra [name] reports hip pain and LBP continuing to improve overall, but still feel tight with intermittent "flare ups."  Left side more tight than right, but both sides responded well to combination of STM/TPR and TrDN to decrease pain.  Debra [name] continues to demonstrate potential for improvement and would benefit from continued skilled therapy to address impairments.       OBJECTIVE IMPAIRMENTS: decreased activity tolerance, increased fascial restrictions, increased muscle spasms, and pain.   ACTIVITY  LIMITATIONS: carrying, lifting, bending, sleeping, bed mobility, and locomotion level  PARTICIPATION LIMITATIONS: meal prep, cleaning, laundry, shopping, and community activity  PERSONAL FACTORS: Past/current experiences and 1-2 comorbidities: history of LBP, pregnancy, obesity   are also affecting patient's functional outcome.   REHAB POTENTIAL: Good  CLINICAL DECISION MAKING:  Evolving/moderate complexity  EVALUATION COMPLEXITY: Moderate   GOALS: Goals reviewed with patient? Yes  SHORT TERM GOALS: Target date: 09/07/2023   Patient will be independent with initial HEP.  Baseline:  Given 08/28/23 Goal status: MET 09/04/23   LONG TERM GOALS: Target date: 11/16/2023   Patient will be independent with advanced/ongoing HEP to improve outcomes and carryover.  Baseline:   Goal status: IN PROGRESS  2.  Patient will report 50% improvement in low back pain/R hip pain to improve QOL.  Baseline:  Goal status: IN PROGRESS  3.  Patient will demonstrate full pain free lumbar ROM to perform ADLs.   Baseline:  Goal status: IN PROGRESS  4.  Patient will report at least 6 points improvement on modified Oswestry to demonstrate improved functional ability.  Baseline: 21/50 Goal status: IN PROGRESS   5. Patient will report 50% improvement in sleep quality  Baseline: increased pain at night preventing sleep Goal status: IN PROGRESS   PLAN:  PT FREQUENCY: 2x/week for 6-8 weeks decreasing to 1x per week when appropriate for 12 weeks total  PT DURATION: 12 weeks  PLANNED INTERVENTIONS: Therapeutic exercises, Therapeutic activity, Neuromuscular re-education, Balance training, Gait training, Patient/Family education, Self Care, Joint mobilization, Dry Needling, Electrical stimulation, Spinal mobilization, Cryotherapy, Moist heat, Manual therapy, and Re-evaluation.  PLAN FOR NEXT SESSION: core stabilization exercises, can lie on back for short periods but try in semi-reclined, manual therapy,  TrDN to glutes/TFL only, no needling in spine region.  Consider Ktape to abdomen also.     Jena Gauss, PT, DPT  09/25/2023, 8:54 AM

## 2023-09-27 NOTE — Telephone Encounter (Signed)
New CPAP order has been faxed to [facility] 09/27/2023.

## 2023-09-28 ENCOUNTER — Ambulatory Visit: Payer: 59 | Admitting: Physical Therapy

## 2023-09-28 ENCOUNTER — Encounter: Payer: Self-pay | Admitting: Physical Therapy

## 2023-09-28 DIAGNOSIS — R252 Cramp and spasm: Secondary | ICD-10-CM

## 2023-09-28 DIAGNOSIS — M5459 Other low back pain: Secondary | ICD-10-CM | POA: Diagnosis not present

## 2023-09-28 DIAGNOSIS — M25551 Pain in right hip: Secondary | ICD-10-CM

## 2023-09-28 NOTE — Therapy (Signed)
OUTPATIENT PHYSICAL THERAPY TREATMENT   Patient Name: Debra [name] MRN: [id number] DOB:08/24/1981, 42 y.o., female Today's Date: 09/28/2023  END OF SESSION:  PT End of Session - 09/28/23 1534     Visit Number 9    Date for PT Re-Evaluation 11/16/23    Authorization Type UHC [id number]    PT Start Time 1530    PT Stop Time 1615    PT Time Calculation (min) 45 min    Activity Tolerance Patient tolerated treatment well    Behavior During Therapy WFL for tasks assessed/performed             Past Medical History:  Diagnosis Date   Acne    Allergic rhinitis    Cold sore 11/02/2016   Depression    GERD (gastroesophageal reflux disease)    Hearing loss of both ears    per pt right > left,  does not wear hearing aids   IDA (iron deficiency anemia)    Incompetent cervix    Infertility, female    Mild obstructive sleep apnea    per pt had sleep study was very mild osa,  did not meet critiria for insurance to pay but pt paid out of pocket since it helps due to dust mite allergy   Prior pregnancy with incompetent cervix in second trimester, antepartum 04/01/2022   03-31-2022 pre-mature ruptured membrane [redacted]wk gestation, nonviable   Uterine polyp    Past Surgical History:  Procedure Laterality Date   CERCLAGE LAPAROSCOPIC ABDOMINAL N/A 07/27/2022   Procedure: CERCLAGE LAPAROSCOPIC CERVICO- ABDOMINAL AND LYSIS OF ADHESIONS;  Surgeon: Fermin Schwab, MD;  Location: [facility];  Service: Gynecology;  Laterality: N/A;   DILATION AND CURETTAGE OF UTERUS N/A 04/01/2022   Procedure: DILATATION AND CURETTAGE;  Surgeon: Shea Evans, MD;  Location: [facility];  Service: Gynecology;  Laterality: N/A;   HYSTEROSCOPY N/A 07/27/2022   Procedure: HYSTEROSCOPY WITH ENDOMETRIAL BIOPSY;  Surgeon: Fermin Schwab, MD;  Location: [facility];  Service: Gynecology;  Laterality: N/A;   Patient Active Problem List   Diagnosis Date Noted   Sleep apnea-like  behavior 09/19/2023   Anemia of pregnancy 07/14/2023   Pregnancy 07/03/2023   Pharyngitis 09/18/2022   Well adult exam 06/08/2022   Preterm delivery 04/01/2022   Incompetent cervix in pregnancy, antepartum, second trimester 04/01/2022   Pregnancy resulting from in vitro fertilization in second trimester 03/31/2022   Premature rupture of membranes in second trimester 03/31/2022   Allergic conjunctivitis of both eyes 12/16/2020   Allergic conjunctivitis 09/29/2020   Insomnia 07/18/2018   Elevated ALT measurement 07/18/2018   Snoring 01/03/2017   Chronic low back pain without sciatica 01/03/2017   Vitamin D deficiency 11/02/2016   Morbid obesity with BMI of 40.0-44.9, adult (HCC) 11/02/2016   Dermatographia 11/02/2016   Cold sore 11/02/2016   Perennial allergic rhinitis 11/02/2016   Acne 11/02/2016   Depressive disorder 11/25/2014   Herpes simplex 12/02/2013   Esophageal reflux 12/02/2013    PCP: Clayborne Dana, NP   REFERRING PROVIDER: Olivia Mackie, MD   REFERRING DIAG: M54.50 (ICD-10-CM) - Low back pain, unspecified   Rationale for Evaluation and Treatment: Rehabilitation  THERAPY DIAG:  Other low back pain  Pain in right hip  Cramp and spasm  ONSET DATE: ~ 1 month  SUBJECTIVE:  SUBJECTIVE STATEMENT: Back is doing well today. Not flared up pain wise.  Note getting the flares of pain now.    PERTINENT HISTORY:  [redacted] weeks pregnant, h/o anemia, leukocytosis, LBP  PAIN:  Are you having pain? Yes: NPRS scale: 4/10 Pain location:  low back, R hip/glutes  Pain description: consistent dull pain in hip, acute sharp pain in back Aggravating factors: 7-8/10 at worst, laying on side Relieving factors: heating pad, massage, PT  PRECAUTIONS: Other: pregnant, no DN in lumbar/sacral  region  RED FLAGS: None   WEIGHT BEARING RESTRICTIONS: No  FALLS:  Has patient fallen in last 6 months? No  LIVING ENVIRONMENT: Lives with: lives with their spouse Lives in: House/apartment Stairs: Yes: Internal: 14 steps; on right going up and on left going up and External: 1 steps; none Has following equipment at home: None  OCCUPATION: full time at [employer], but works at home most of the time.   PLOF: Independent  PATIENT GOALS: "lesses the pain, strengthen the muscles, stretch things out so not as painful"  NEXT MD VISIT: 08/25/2023  OBJECTIVE:   DIAGNOSTIC FINDINGS:  No relevant imaging  PATIENT SURVEYS:  Modified Oswestry 21/50   COGNITION: Overall cognitive status: Within functional limits for tasks assessed     SENSATION: WFL  MUSCLE LENGTH: Hamstrings: Right 90 deg; Left 90 deg  POSTURE: No Significant postural limitations  PALPATION: Tenderness L4/L5, bil QL, glutes and piriformis, SIJ, greater trochanters, Right worse than left.   LUMBAR ROM:  NT  LOWER EXTREMITY ROM:   good hip mobility bilaterally for both hip internal and external rotation.    LOWER EXTREMITY MMT:    MMT Right eval Left eval  Hip flexion 4+ 4+  Hip extension    Hip abduction 5 (in SL) 5 (in SL)  Hip adduction 5 5  Knee flexion 5 5  Knee extension 5 5  Ankle dorsiflexion 5 5  Ankle plantarflexion     (Blank rows = not tested)  LUMBAR SPECIAL TESTS:  Straight leg raise test: Negative and FABER test: Negative  FUNCTIONAL TESTS:    GAIT: Distance walked: 54' Assistive device utilized: None Level of assistance: Complete Independence Comments: no significant deviation or device  TODAY'S TREATMENT:                                                                                                                              DATE:   09/28/23 Therapeutic Exercise: to improve strength and mobility.  Demo, verbal and tactile cues throughout for technique. Nustep L5 x  5 min LE only  Manual Therapy: to decrease muscle spasm and pain and improve mobility STM/TPR to bil QL, bil glute med and max, piriformis,  skilled palpation and monitoring during dry needling. Trigger Point Dry-Needling  Treatment instructions: Expect mild to moderate muscle soreness. S/S of pneumothorax if dry needled over a lung field, and to seek immediate medical attention should they occur. Patient verbalized understanding  of these instructions and education. Patient Consent Given: Yes Education handout provided: Yes Muscles treated: bil glute med and piriformis Electrical stimulation performed: No Parameters: N/A Treatment response/outcome: Twitch Response Elicited and Palpable Increase in Muscle Length  09/25/23 Therapeutic Exercise: to improve strength and mobility.  Demo, verbal and tactile cues throughout for technique. Nustep L5 x 5 min LE only  Manual Therapy: to decrease muscle spasm and pain and improve mobility STM/TPR to bil QL, bil glute med and max, piriformis,  skilled palpation and monitoring during dry needling. Trigger Point Dry-Needling  Treatment instructions: Expect mild to moderate muscle soreness. S/S of pneumothorax if dry needled over a lung field, and to seek immediate medical attention should they occur. Patient verbalized understanding of these instructions and education. Patient Consent Given: Yes Education handout provided: Yes Muscles treated: bil glute med and piriformis Electrical stimulation performed: No Parameters: N/A Treatment response/outcome: Twitch Response Elicited and Palpable Increase in Muscle Length  09/18/23 Therapeutic Exercise: to improve strength and mobility.  Demo, verbal and tactile cues throughout for technique. Nustep L5 x 5 min LE only  Manual Therapy: to decrease muscle spasm and pain and improve mobility STM/TPR to L QL, bil glute med and max, piriformis, QL, skilled palpation and monitoring during dry needling. Trigger Point  Dry-Needling  Treatment instructions: Expect mild to moderate muscle soreness. S/S of pneumothorax if dry needled over a lung field, and to seek immediate medical attention should they occur. Patient verbalized understanding of these instructions and education. Patient Consent Given: Yes Education handout provided: Yes Muscles treated: bil glute med and piriformis Electrical stimulation performed: No Parameters: N/A Treatment response/outcome: Twitch Response Elicited and Palpable Increase in Muscle Length   PATIENT EDUCATION:  Education details: continue HEP Person educated: Patient Education method: Explanation Education comprehension: verbalized understanding  HOME EXERCISE PROGRAM: Access Code: [email or web address] URL: [email or web address] Date: 08/28/2023 Prepared by: Harrie Foreman  Exercises - Supine Diaphragmatic Breathing  - 1 x daily - 7 x weekly - 1 sets - 10 reps - Seated Diaphragmatic Breathing  - 1 x daily - 7 x weekly - 1 sets - 10 reps - Supine Posterior Pelvic Tilt  - 1 x daily - 7 x weekly - 1 sets - 10 reps - Seated Pelvic Tilt  - 1 x daily - 7 x weekly - 1 sets - 10 reps - Posterior Pelvic Tilt with Adduction Using Pillow in Hooklying  - 1 x daily - 7 x weekly - 1 sets - 10 reps - Bent Knee Fallouts  - 1 x daily - 7 x weekly - 1 sets - 10 reps - Supine Posterior Pelvic Tilt with Knee Rocks  - 1 x daily - 7 x weekly - 1 sets - 10 reps  ASSESSMENT:  CLINICAL IMPRESSION: Debra [name] reports continued improvement in pain, with less low back pain and less "flares" of pain overall, so continued with focus on manual therapy with good tolerance and report of decreased tightness following.  Debra [name] continues to demonstrate potential for improvement and would benefit from continued skilled therapy to address impairments.       OBJECTIVE IMPAIRMENTS: decreased activity tolerance, increased fascial restrictions, increased muscle spasms,  and pain.   ACTIVITY LIMITATIONS: carrying, lifting, bending, sleeping, bed mobility, and locomotion level  PARTICIPATION LIMITATIONS: meal prep, cleaning, laundry, shopping, and community activity  PERSONAL FACTORS: Past/current experiences and 1-2 comorbidities: history of LBP, pregnancy, obesity   are also affecting patient's functional outcome.   REHAB POTENTIAL: Good  CLINICAL DECISION MAKING: Evolving/moderate complexity  EVALUATION COMPLEXITY: Moderate   GOALS: Goals reviewed with patient? Yes  SHORT TERM GOALS: Target date: 09/07/2023   Patient will be independent with initial HEP.  Baseline:  Given 08/28/23 Goal status: MET 09/04/23   LONG TERM GOALS: Target date: 11/16/2023   Patient will be independent with advanced/ongoing HEP to improve outcomes and carryover.  Baseline:   Goal status: IN PROGRESS  2.  Patient will report 50% improvement in low back pain/R hip pain to improve QOL.  Baseline:  Goal status: IN PROGRESS  3.  Patient will demonstrate full pain free lumbar ROM to perform ADLs.   Baseline:  Goal status: IN PROGRESS  4.  Patient will report at least 6 points improvement on modified Oswestry to demonstrate improved functional ability.  Baseline: 21/50 Goal status: IN PROGRESS   5. Patient will report 50% improvement in sleep quality  Baseline: increased pain at night preventing sleep Goal status: IN PROGRESS   PLAN:  PT FREQUENCY: 2x/week for 6-8 weeks decreasing to 1x per week when appropriate for 12 weeks total  PT DURATION: 12 weeks  PLANNED INTERVENTIONS: Therapeutic exercises, Therapeutic activity, Neuromuscular re-education, Balance training, Gait training, Patient/Family education, Self Care, Joint mobilization, Dry Needling, Electrical stimulation, Spinal mobilization, Cryotherapy, Moist heat, Manual therapy, and Re-evaluation.  PLAN FOR NEXT SESSION: core stabilization exercises, can lie on back for short periods but try in  semi-reclined, manual therapy, TrDN to glutes/TFL only, no needling in spine region.  Consider Ktape to abdomen also.     Jena Gauss, PT, DPT  09/28/2023, 5:37 PM

## 2023-10-09 ENCOUNTER — Encounter: Payer: Self-pay | Admitting: Physical Therapy

## 2023-10-09 ENCOUNTER — Ambulatory Visit: Payer: 59 | Admitting: Physical Therapy

## 2023-10-09 DIAGNOSIS — M25551 Pain in right hip: Secondary | ICD-10-CM

## 2023-10-09 DIAGNOSIS — M5459 Other low back pain: Secondary | ICD-10-CM

## 2023-10-09 DIAGNOSIS — R252 Cramp and spasm: Secondary | ICD-10-CM

## 2023-10-09 NOTE — Therapy (Signed)
OUTPATIENT PHYSICAL THERAPY TREATMENT   Patient Name: Debra [name] MRN: [id number] DOB:[birth year], 42 y.o., female Today's Date: 10/09/2023  END OF SESSION:  PT End of Session - 10/09/23 0847     Visit Number 10    Date for PT Re-Evaluation 11/16/23    Authorization Type UHC [id number]    PT Start Time 0847    PT Stop Time 0928    PT Time Calculation (min) 41 min    Activity Tolerance Patient tolerated treatment well    Behavior During Therapy [facility] for tasks assessed/performed              Past Medical History:  Diagnosis Date   Acne    Allergic rhinitis    Cold sore 11/02/2016   Depression    GERD (gastroesophageal reflux disease)    Hearing loss of both ears    per pt right > left,  does not wear hearing aids   IDA (iron deficiency anemia)    Incompetent cervix    Infertility, female    Mild obstructive sleep apnea    per pt had sleep study was very mild osa,  did not meet critiria for insurance to pay but pt paid out of pocket since it helps due to dust mite allergy   Prior pregnancy with incompetent cervix in second trimester, antepartum 04/01/2022   03-31-2022 pre-mature ruptured membrane [redacted]wk gestation, nonviable   Uterine polyp    Past Surgical History:  Procedure Laterality Date   CERCLAGE LAPAROSCOPIC ABDOMINAL N/A 07/27/2022   Procedure: CERCLAGE LAPAROSCOPIC CERVICO- ABDOMINAL AND LYSIS OF ADHESIONS;  Surgeon: Fermin Schwab, MD;  Location: [facility];  Service: Gynecology;  Laterality: N/A;   DILATION AND CURETTAGE OF UTERUS N/A 04/01/2022   Procedure: DILATATION AND CURETTAGE;  Surgeon: Shea Evans, MD;  Location: [facility];  Service: Gynecology;  Laterality: N/A;   HYSTEROSCOPY N/A 07/27/2022   Procedure: HYSTEROSCOPY WITH ENDOMETRIAL BIOPSY;  Surgeon: Fermin Schwab, MD;  Location: [facility];  Service: Gynecology;  Laterality: N/A;   Patient Active Problem List   Diagnosis Date Noted   Sleep apnea-like  behavior 09/19/2023   Anemia of pregnancy 07/14/2023   Pregnancy 07/03/2023   Pharyngitis 09/18/2022   Well adult exam 06/08/2022   Preterm delivery 04/01/2022   Incompetent cervix in pregnancy, antepartum, second trimester 04/01/2022   Pregnancy resulting from in vitro fertilization in second trimester 03/31/2022   Premature rupture of membranes in second trimester 03/31/2022   Allergic conjunctivitis of both eyes 12/16/2020   Allergic conjunctivitis 09/29/2020   Insomnia 07/18/2018   Elevated ALT measurement 07/18/2018   Snoring 01/03/2017   Chronic low back pain without sciatica 01/03/2017   Vitamin D deficiency 11/02/2016   Morbid obesity with BMI of 40.0-44.9, adult (HCC) 11/02/2016   Dermatographia 11/02/2016   Cold sore 11/02/2016   Perennial allergic rhinitis 11/02/2016   Acne 11/02/2016   Depressive disorder 11/25/2014   Herpes simplex 12/02/2013   Esophageal reflux 12/02/2013    PCP: Clayborne Dana, NP   REFERRING PROVIDER: Olivia Mackie, MD   REFERRING DIAG: M54.50 (ICD-10-CM) - Low back pain, unspecified   Rationale for Evaluation and Treatment: Rehabilitation  THERAPY DIAG:  Other low back pain  Pain in right hip  Cramp and spasm  ONSET DATE: ~ 1 month   SUBJECTIVE:  SUBJECTIVE STATEMENT: Pt reports the glute and LE pain is becoming more intermittent.  PERTINENT HISTORY:  [redacted] weeks pregnant, h/o anemia, leukocytosis, LBP  PAIN:  Are you having pain? Yes: NPRS scale: low back & sacrum 5-6/10; glutes 4/10 Pain location:  low back, R hip/glutes  Pain description: dull, throbbing in low back & sacrum; intermittent low throbbing in glutes and thighs  Aggravating factors: 7-8/10 at worst, laying on side Relieving factors: heating pad, massage, PT  PRECAUTIONS: Other:  pregnant, no DN in lumbar/sacral region  RED FLAGS: None   WEIGHT BEARING RESTRICTIONS: No  FALLS:  Has patient fallen in last 6 months? No  LIVING ENVIRONMENT: Lives with: lives with their spouse Lives in: House/apartment Stairs: Yes: Internal: 14 steps; on right going up and on left going up and External: 1 steps; none Has following equipment at home: None  OCCUPATION: full time at [employer], but works at home most of the time.   PLOF: Independent  PATIENT GOALS: "lesses the pain, strengthen the muscles, stretch things out so not as painful"  NEXT MD VISIT: weekly   OBJECTIVE:   DIAGNOSTIC FINDINGS:  No relevant imaging  PATIENT SURVEYS:  Modified Oswestry 21/50   COGNITION: Overall cognitive status: Within functional limits for tasks assessed     SENSATION: WFL  MUSCLE LENGTH: Hamstrings: Right 90 deg; Left 90 deg  POSTURE: No Significant postural limitations  PALPATION: Tenderness L4/L5, bil QL, glutes and piriformis, SIJ, greater trochanters, Right worse than left.   LUMBAR ROM:  NT  LOWER EXTREMITY ROM:   good hip mobility bilaterally for both hip internal and external rotation.    LOWER EXTREMITY MMT:    MMT Right eval Left eval  Hip flexion 4+ 4+  Hip extension    Hip abduction 5 (in SL) 5 (in SL)  Hip adduction 5 5  Knee flexion 5 5  Knee extension 5 5  Ankle dorsiflexion 5 5  Ankle plantarflexion     (Blank rows = not tested)  LUMBAR SPECIAL TESTS:  Straight leg raise test: Negative and FABER test: Negative  FUNCTIONAL TESTS:    GAIT: Distance walked: 107' Assistive device utilized: None Level of assistance: Complete Independence Comments: no significant deviation or device   TODAY'S TREATMENT:                                                                                                                              DATE:   10/09/23 THERAPEUTIC EXERCISE: to improve flexibility, strength and mobility.  Demonstration,  verbal and tactile cues throughout for technique.  Nustep L5 x 5 min (LE only)   MANUAL THERAPY: To promote normalized muscle tension, improved flexibility, and reduced pain. Skilled palpation and monitoring of soft tissue during DN Trigger Point Dry-Needling (Pt positioned on side with table angled upward.) Treatment instructions: Expect mild to moderate muscle soreness. Patient verbalized understanding of these instructions and education. Patient Consent Given: Yes Education handout provided: Previously  provided Muscles treated: B glute max/med/min and piriformis Electrical stimulation performed: No Parameters: N/A Treatment response/outcome: Twitch Response Elicited and Palpable Increase in Muscle Length STM/DTM, manual TPR and pin & stretch to muscles addressed with DN, as well as QL   09/28/23 Therapeutic Exercise: to improve strength and mobility.  Demo, verbal and tactile cues throughout for technique. Nustep L5 x 5 min LE only  Manual Therapy: to decrease muscle spasm and pain and improve mobility STM/TPR to bil QL, bil glute med and max, piriformis,  skilled palpation and monitoring during dry needling. Trigger Point Dry-Needling  Treatment instructions: Expect mild to moderate muscle soreness. S/S of pneumothorax if dry needled over a lung field, and to seek immediate medical attention should they occur. Patient verbalized understanding of these instructions and education. Patient Consent Given: Yes Education handout provided: Yes Muscles treated: bil glute med and piriformis Electrical stimulation performed: No Parameters: N/A Treatment response/outcome: Twitch Response Elicited and Palpable Increase in Muscle Length  09/25/23 Therapeutic Exercise: to improve strength and mobility.  Demo, verbal and tactile cues throughout for technique. Nustep L5 x 5 min LE only  Manual Therapy: to decrease muscle spasm and pain and improve mobility STM/TPR to bil QL, bil glute med and max,  piriformis,  skilled palpation and monitoring during dry needling. Trigger Point Dry-Needling  Treatment instructions: Expect mild to moderate muscle soreness. S/S of pneumothorax if dry needled over a lung field, and to seek immediate medical attention should they occur. Patient verbalized understanding of these instructions and education. Patient Consent Given: Yes Education handout provided: Yes Muscles treated: bil glute med and piriformis Electrical stimulation performed: No Parameters: N/A Treatment response/outcome: Twitch Response Elicited and Palpable Increase in Muscle Length    PATIENT EDUCATION:  Education details: continue HEP Person educated: Patient Education method: Explanation Education comprehension: verbalized understanding  HOME EXERCISE PROGRAM: Access Code: [email or web address] URL: [email or web address] Date: 08/28/2023 Prepared by: Harrie Foreman  Exercises - Supine Diaphragmatic Breathing  - 1 x daily - 7 x weekly - 1 sets - 10 reps - Seated Diaphragmatic Breathing  - 1 x daily - 7 x weekly - 1 sets - 10 reps - Supine Posterior Pelvic Tilt  - 1 x daily - 7 x weekly - 1 sets - 10 reps - Seated Pelvic Tilt  - 1 x daily - 7 x weekly - 1 sets - 10 reps - Posterior Pelvic Tilt with Adduction Using Pillow in Hooklying  - 1 x daily - 7 x weekly - 1 sets - 10 reps - Bent Knee Fallouts  - 1 x daily - 7 x weekly - 1 sets - 10 reps - Supine Posterior Pelvic Tilt with Knee Rocks  - 1 x daily - 7 x weekly - 1 sets - 10 reps   ASSESSMENT:  CLINICAL IMPRESSION: Debra [name] reports decreasing frequency of glute and proximal LE pain (becoming more intermittent). She continues to note benefit from MT and DN as she feels that this reduces the muscle strain pulling against her back, therefore continued with focus on manual therapy with good tolerance and report of decreased tightness and pain by end of session.  Debra [name] continues to demonstrate potential for improvement and  would benefit from continued skilled therapy to address impairments.    OBJECTIVE IMPAIRMENTS: decreased activity tolerance, increased fascial restrictions, increased muscle spasms, and pain.   ACTIVITY LIMITATIONS: carrying, lifting, bending, sleeping, bed mobility, and locomotion level  PARTICIPATION LIMITATIONS: meal prep, cleaning, laundry, shopping, and community activity  PERSONAL FACTORS:  Past/current experiences and 1-2 comorbidities: history of LBP, pregnancy, obesity   are also affecting patient's functional outcome.   REHAB POTENTIAL: Good  CLINICAL DECISION MAKING: Evolving/moderate complexity  EVALUATION COMPLEXITY: Moderate   GOALS: Goals reviewed with patient? Yes  SHORT TERM GOALS: Target date: 09/07/2023   Patient will be independent with initial HEP.  Baseline:  Given 08/28/23 Goal status: MET 09/04/23   LONG TERM GOALS: Target date: 11/16/2023   Patient will be independent with advanced/ongoing HEP to improve outcomes and carryover.  Baseline:   Goal status: IN PROGRESS  2.  Patient will report 50% improvement in low back pain/R hip pain to improve QOL.  Baseline:  Goal status: IN PROGRESS  3.  Patient will demonstrate full pain free lumbar ROM to perform ADLs.   Baseline:  Goal status: IN PROGRESS  4.  Patient will report at least 6 points improvement on modified Oswestry to demonstrate improved functional ability.  Baseline: 21/50 Goal status: IN PROGRESS   5. Patient will report 50% improvement in sleep quality  Baseline: increased pain at night preventing sleep Goal status: IN PROGRESS   PLAN:  PT FREQUENCY: 2x/week for 6-8 weeks decreasing to 1x per week when appropriate for 12 weeks total  PT DURATION: 12 weeks  PLANNED INTERVENTIONS: Therapeutic exercises, Therapeutic activity, Neuromuscular re-education, Balance training, Gait training, Patient/Family education, Self Care, Joint mobilization, Dry Needling, Electrical stimulation, Spinal  mobilization, Cryotherapy, Moist heat, Manual therapy, and Re-evaluation.  PLAN FOR NEXT SESSION: core stabilization exercises, can lie on back for short periods but try in semi-reclined, manual therapy, TrDN to glutes/TFL only, no needling in spine region.  Consider Ktape to abdomen also.     Marry Guan, PT, DPT  10/09/2023, 9:32 AM

## 2023-10-12 ENCOUNTER — Ambulatory Visit: Payer: 59 | Admitting: Physical Therapy

## 2023-10-12 ENCOUNTER — Encounter: Payer: Self-pay | Admitting: Physical Therapy

## 2023-10-12 DIAGNOSIS — M25551 Pain in right hip: Secondary | ICD-10-CM

## 2023-10-12 DIAGNOSIS — R252 Cramp and spasm: Secondary | ICD-10-CM

## 2023-10-12 DIAGNOSIS — M5459 Other low back pain: Secondary | ICD-10-CM | POA: Diagnosis not present

## 2023-10-12 NOTE — Therapy (Signed)
OUTPATIENT PHYSICAL THERAPY TREATMENT   Patient Name: Debra [name] MRN: [id number] DOB:[birth year], 42 y.o., female Today's Date: 10/12/2023  END OF SESSION:  PT End of Session - 10/12/23 0851     Visit Number 11    Date for PT Re-Evaluation 11/16/23    Authorization Type UHC [id number]    PT Start Time 0849    PT Stop Time 0930    PT Time Calculation (min) 41 min    Activity Tolerance Patient tolerated treatment well    Behavior During Therapy [facility] for tasks assessed/performed              Past Medical History:  Diagnosis Date   Acne    Allergic rhinitis    Cold sore 11/02/2016   Depression    GERD (gastroesophageal reflux disease)    Hearing loss of both ears    per pt right > left,  does not wear hearing aids   IDA (iron deficiency anemia)    Incompetent cervix    Infertility, female    Mild obstructive sleep apnea    per pt had sleep study was very mild osa,  did not meet critiria for insurance to pay but pt paid out of pocket since it helps due to dust mite allergy   Prior pregnancy with incompetent cervix in second trimester, antepartum 04/01/2022   03-31-2022 pre-mature ruptured membrane [redacted]wk gestation, nonviable   Uterine polyp    Past Surgical History:  Procedure Laterality Date   CERCLAGE LAPAROSCOPIC ABDOMINAL N/A 07/27/2022   Procedure: CERCLAGE LAPAROSCOPIC CERVICO- ABDOMINAL AND LYSIS OF ADHESIONS;  Surgeon: Fermin Schwab, MD;  Location: [facility];  Service: Gynecology;  Laterality: N/A;   DILATION AND CURETTAGE OF UTERUS N/A 04/01/2022   Procedure: DILATATION AND CURETTAGE;  Surgeon: Shea Evans, MD;  Location: [facility];  Service: Gynecology;  Laterality: N/A;   HYSTEROSCOPY N/A 07/27/2022   Procedure: HYSTEROSCOPY WITH ENDOMETRIAL BIOPSY;  Surgeon: Fermin Schwab, MD;  Location: [facility];  Service: Gynecology;  Laterality: N/A;   Patient Active Problem List   Diagnosis Date Noted   Sleep apnea-like  behavior 09/19/2023   Anemia of pregnancy 07/14/2023   Pregnancy 07/03/2023   Pharyngitis 09/18/2022   Well adult exam 06/08/2022   Preterm delivery 04/01/2022   Incompetent cervix in pregnancy, antepartum, second trimester 04/01/2022   Pregnancy resulting from in vitro fertilization in second trimester 03/31/2022   Premature rupture of membranes in second trimester 03/31/2022   Allergic conjunctivitis of both eyes 12/16/2020   Allergic conjunctivitis 09/29/2020   Insomnia 07/18/2018   Elevated ALT measurement 07/18/2018   Snoring 01/03/2017   Chronic low back pain without sciatica 01/03/2017   Vitamin D deficiency 11/02/2016   Morbid obesity with BMI of 40.0-44.9, adult (HCC) 11/02/2016   Dermatographia 11/02/2016   Cold sore 11/02/2016   Perennial allergic rhinitis 11/02/2016   Acne 11/02/2016   Depressive disorder 11/25/2014   Herpes simplex 12/02/2013   Esophageal reflux 12/02/2013    PCP: Clayborne Dana, NP   REFERRING PROVIDER: Olivia Mackie, MD   REFERRING DIAG: M54.50 (ICD-10-CM) - Low back pain, unspecified   Rationale for Evaluation and Treatment: Rehabilitation  THERAPY DIAG:  Other low back pain  Pain in right hip  Cramp and spasm  ONSET DATE: ~ 1 month   SUBJECTIVE:  SUBJECTIVE STATEMENT: Doing better, back and hips are settling down a lot, thinks she could go to 1x/week.     PERTINENT HISTORY:  [redacted] weeks pregnant, h/o anemia, leukocytosis, LBP  PAIN:  Are you having pain? Yes: NPRS scale: low back & sacrum 4/10; glutes 4/10 Pain location:  low back, R hip/glutes  Pain description: dull, throbbing in low back & sacrum; intermittent low throbbing in glutes and thighs  Aggravating factors: 7-8/10 at worst, laying on side Relieving factors: heating pad, massage,  PT  PRECAUTIONS: Other: pregnant, no DN in lumbar/sacral region  RED FLAGS: None   WEIGHT BEARING RESTRICTIONS: No  FALLS:  Has patient fallen in last 6 months? No  LIVING ENVIRONMENT: Lives with: lives with their spouse Lives in: House/apartment Stairs: Yes: Internal: 14 steps; on right going up and on left going up and External: 1 steps; none Has following equipment at home: None  OCCUPATION: full time at [employer], but works at home most of the time.   PLOF: Independent  PATIENT GOALS: "lesses the pain, strengthen the muscles, stretch things out so not as painful"  NEXT MD VISIT: weekly   OBJECTIVE:   DIAGNOSTIC FINDINGS:  No relevant imaging  PATIENT SURVEYS:  Modified Oswestry 21/50   COGNITION: Overall cognitive status: Within functional limits for tasks assessed     SENSATION: WFL  MUSCLE LENGTH: Hamstrings: Right 90 deg; Left 90 deg  POSTURE: No Significant postural limitations  PALPATION: Tenderness L4/L5, bil QL, glutes and piriformis, SIJ, greater trochanters, Right worse than left.   LUMBAR ROM:  NT  LOWER EXTREMITY ROM:   good hip mobility bilaterally for both hip internal and external rotation.    LOWER EXTREMITY MMT:    MMT Right eval Left eval  Hip flexion 4+ 4+  Hip extension    Hip abduction 5 (in SL) 5 (in SL)  Hip adduction 5 5  Knee flexion 5 5  Knee extension 5 5  Ankle dorsiflexion 5 5  Ankle plantarflexion     (Blank rows = not tested)  LUMBAR SPECIAL TESTS:  Straight leg raise test: Negative and FABER test: Negative  FUNCTIONAL TESTS:    GAIT: Distance walked: 15' Assistive device utilized: None Level of assistance: Complete Independence Comments: no significant deviation or device   TODAY'S TREATMENT:                                                                                                                              DATE:   10/12/2023  Therapeutic Exercise: to improve strength and mobility.   Demo, verbal and tactile cues throughout for technique. Nustep L5 x 5 min LE only  Manual Therapy: to decrease muscle spasm and pain and improve mobility STM/TPR to bil QL, bil glute med and max, piriformis, pin and stretch to piriformis, skilled palpation and monitoring during dry needling. Trigger Point Dry-Needling  Treatment instructions: Expect mild to moderate muscle soreness. S/S of pneumothorax if dry  needled over a lung field, and to seek immediate medical attention should they occur. Patient verbalized understanding of these instructions and education. Patient Consent Given: Yes Education handout provided: Yes Muscles treated: bil glute med/max and piriformis Electrical stimulation performed: No Parameters: N/A Treatment response/outcome: Twitch Response Elicited and Palpable Increase in Muscle Length  10/09/23 THERAPEUTIC EXERCISE: to improve flexibility, strength and mobility.  Demonstration, verbal and tactile cues throughout for technique.  Nustep L5 x 5 min (LE only)   MANUAL THERAPY: To promote normalized muscle tension, improved flexibility, and reduced pain. Skilled palpation and monitoring of soft tissue during DN Trigger Point Dry-Needling (Pt positioned on side with table angled upward.) Treatment instructions: Expect mild to moderate muscle soreness. Patient verbalized understanding of these instructions and education. Patient Consent Given: Yes Education handout provided: Previously provided Muscles treated: B glute max/med/min and piriformis Electrical stimulation performed: No Parameters: N/A Treatment response/outcome: Twitch Response Elicited and Palpable Increase in Muscle Length STM/DTM, manual TPR and pin & stretch to muscles addressed with DN, as well as QL   09/28/23 Therapeutic Exercise: to improve strength and mobility.  Demo, verbal and tactile cues throughout for technique. Nustep L5 x 5 min LE only  Manual Therapy: to decrease muscle spasm and pain  and improve mobility STM/TPR to bil QL, bil glute med and max, piriformis,  skilled palpation and monitoring during dry needling. Trigger Point Dry-Needling  Treatment instructions: Expect mild to moderate muscle soreness. S/S of pneumothorax if dry needled over a lung field, and to seek immediate medical attention should they occur. Patient verbalized understanding of these instructions and education. Patient Consent Given: Yes Education handout provided: Yes Muscles treated: bil glute med and piriformis Electrical stimulation performed: No Parameters: N/A Treatment response/outcome: Twitch Response Elicited and Palpable Increase in Muscle Length    PATIENT EDUCATION:  Education details: continue HEP Person educated: Patient Education method: Explanation Education comprehension: verbalized understanding  HOME EXERCISE PROGRAM: Access Code: [email or web address] URL: [email or web address] Date: 08/28/2023 Prepared by: Harrie Foreman  Exercises - Supine Diaphragmatic Breathing  - 1 x daily - 7 x weekly - 1 sets - 10 reps - Seated Diaphragmatic Breathing  - 1 x daily - 7 x weekly - 1 sets - 10 reps - Supine Posterior Pelvic Tilt  - 1 x daily - 7 x weekly - 1 sets - 10 reps - Seated Pelvic Tilt  - 1 x daily - 7 x weekly - 1 sets - 10 reps - Posterior Pelvic Tilt with Adduction Using Pillow in Hooklying  - 1 x daily - 7 x weekly - 1 sets - 10 reps - Bent Knee Fallouts  - 1 x daily - 7 x weekly - 1 sets - 10 reps - Supine Posterior Pelvic Tilt with Knee Rocks  - 1 x daily - 7 x weekly - 1 sets - 10 reps   ASSESSMENT:  CLINICAL IMPRESSION: Fany continues to reports decreasing frequency of glute and proximal LE pain and is planning on decreasing frequency to 1x/week now. She continues to note benefit from MT and DN as she feels that this reduces the muscle strain pulling against her back, therefore continued with focus on manual therapy with good tolerance and report of decreased  tightness and pain by end of session.  Octavia continues to demonstrate potential for improvement and would benefit from continued skilled therapy to address impairments.    OBJECTIVE IMPAIRMENTS: decreased activity tolerance, increased fascial restrictions, increased muscle spasms, and pain.   ACTIVITY LIMITATIONS: carrying, lifting,  bending, sleeping, bed mobility, and locomotion level  PARTICIPATION LIMITATIONS: meal prep, cleaning, laundry, shopping, and community activity  PERSONAL FACTORS: Past/current experiences and 1-2 comorbidities: history of LBP, pregnancy, obesity   are also affecting patient's functional outcome.   REHAB POTENTIAL: Good  CLINICAL DECISION MAKING: Evolving/moderate complexity  EVALUATION COMPLEXITY: Moderate   GOALS: Goals reviewed with patient? Yes  SHORT TERM GOALS: Target date: 09/07/2023   Patient will be independent with initial HEP.  Baseline:  Given 08/28/23 Goal status: MET 09/04/23   LONG TERM GOALS: Target date: 11/16/2023   Patient will be independent with advanced/ongoing HEP to improve outcomes and carryover.  Baseline:   Goal status: IN PROGRESS  2.  Patient will report 50% improvement in low back pain/R hip pain to improve QOL.  Baseline:  Goal status: IN PROGRESS  3.  Patient will demonstrate full pain free lumbar ROM to perform ADLs.   Baseline:  Goal status: IN PROGRESS  4.  Patient will report at least 6 points improvement on modified Oswestry to demonstrate improved functional ability.  Baseline: 21/50 Goal status: IN PROGRESS   5. Patient will report 50% improvement in sleep quality  Baseline: increased pain at night preventing sleep Goal status: IN PROGRESS   PLAN:  PT FREQUENCY: 2x/week for 6-8 weeks decreasing to 1x per week when appropriate for 12 weeks total  PT DURATION: 12 weeks  PLANNED INTERVENTIONS: Therapeutic exercises, Therapeutic activity, Neuromuscular re-education, Balance training, Gait training,  Patient/Family education, Self Care, Joint mobilization, Dry Needling, Electrical stimulation, Spinal mobilization, Cryotherapy, Moist heat, Manual therapy, and Re-evaluation.  PLAN FOR NEXT SESSION: core stabilization exercises, can lie on back for short periods but try in semi-reclined, manual therapy, TrDN to glutes/TFL only, no needling in spine region.  Consider Ktape to abdomen also.     Jena Gauss, PT, DPT  10/12/2023, 10:34 AM

## 2023-10-16 ENCOUNTER — Ambulatory Visit: Payer: 59 | Admitting: Physical Therapy

## 2023-10-16 ENCOUNTER — Encounter: Payer: Self-pay | Admitting: Physical Therapy

## 2023-10-16 DIAGNOSIS — R252 Cramp and spasm: Secondary | ICD-10-CM

## 2023-10-16 DIAGNOSIS — M5459 Other low back pain: Secondary | ICD-10-CM | POA: Diagnosis not present

## 2023-10-16 DIAGNOSIS — M25551 Pain in right hip: Secondary | ICD-10-CM

## 2023-10-16 NOTE — Therapy (Signed)
OUTPATIENT PHYSICAL THERAPY TREATMENT   Patient Name: Debra [name] MRN: [id number] DOB:1981-01-14, 42 y.o., female Today's Date: 10/16/2023  END OF SESSION:  PT End of Session - 10/16/23 0851     Visit Number 12    Date for PT Re-Evaluation 11/16/23    Authorization Type [id number]    PT Start Time 0848    PT Stop Time 0929    PT Time Calculation (min) 41 min    Activity Tolerance Patient tolerated treatment well    Behavior During Therapy [facility] for tasks assessed/performed              Past Medical History:  Diagnosis Date   Acne    Allergic rhinitis    Cold sore 11/02/2016   Depression    GERD (gastroesophageal reflux disease)    Hearing loss of both ears    per pt right > left,  does not wear hearing aids   IDA (iron deficiency anemia)    Incompetent cervix    Infertility, female    Mild obstructive sleep apnea    per pt had sleep study was very mild osa,  did not meet critiria for insurance to pay but pt paid out of pocket since it helps due to dust mite allergy   Prior pregnancy with incompetent cervix in second trimester, antepartum 04/01/2022   03-31-2022 pre-mature ruptured membrane [redacted]wk gestation, nonviable   Uterine polyp    Past Surgical History:  Procedure Laterality Date   CERCLAGE LAPAROSCOPIC ABDOMINAL N/A 07/27/2022   Procedure: CERCLAGE LAPAROSCOPIC CERVICO- ABDOMINAL AND LYSIS OF ADHESIONS;  Surgeon: Fermin Schwab, MD;  Location: [facility];  Service: Gynecology;  Laterality: N/A;   DILATION AND CURETTAGE OF UTERUS N/A 04/01/2022   Procedure: DILATATION AND CURETTAGE;  Surgeon: Shea Evans, MD;  Location: [facility];  Service: Gynecology;  Laterality: N/A;   HYSTEROSCOPY N/A 07/27/2022   Procedure: HYSTEROSCOPY WITH ENDOMETRIAL BIOPSY;  Surgeon: Fermin Schwab, MD;  Location: [facility];  Service: Gynecology;  Laterality: N/A;   Patient Active Problem List   Diagnosis Date Noted   Sleep apnea-like  behavior 09/19/2023   Anemia of pregnancy 07/14/2023   Pregnancy 07/03/2023   Pharyngitis 09/18/2022   Well adult exam 06/08/2022   Preterm delivery 04/01/2022   Incompetent cervix in pregnancy, antepartum, second trimester 04/01/2022   Pregnancy resulting from in vitro fertilization in second trimester 03/31/2022   Premature rupture of membranes in second trimester 03/31/2022   Allergic conjunctivitis of both eyes 12/16/2020   Allergic conjunctivitis 09/29/2020   Insomnia 07/18/2018   Elevated ALT measurement 07/18/2018   Snoring 01/03/2017   Chronic low back pain without sciatica 01/03/2017   Vitamin D deficiency 11/02/2016   Morbid obesity with BMI of 40.0-44.9, adult (HCC) 11/02/2016   Dermatographia 11/02/2016   Cold sore 11/02/2016   Perennial allergic rhinitis 11/02/2016   Acne 11/02/2016   Depressive disorder 11/25/2014   Herpes simplex 12/02/2013   Esophageal reflux 12/02/2013    PCP: Clayborne Dana, NP   REFERRING PROVIDER: Olivia Mackie, MD   REFERRING DIAG: M54.50 (ICD-10-CM) - Low back pain, unspecified   Rationale for Evaluation and Treatment: Rehabilitation  THERAPY DIAG:  Other low back pain  Pain in right hip  Cramp and spasm  ONSET DATE: ~ 1 month   SUBJECTIVE:  SUBJECTIVE STATEMENT: Hips feeling decent, Left little spicier but that's that's the side I sleep on, tailbone and back sore but I think its because I laid on my back Friday to get a facial.    PERTINENT HISTORY:  [redacted] weeks pregnant, h/o anemia, leukocytosis, LBP  PAIN:  Are you having pain? Yes: NPRS scale: low back & sacrum 4/10; glutes 4/10 Pain location:  low back, R hip/glutes  Pain description: dull, throbbing in low back & sacrum; intermittent low throbbing in glutes and thighs  Aggravating  factors: 7-8/10 at worst, laying on side Relieving factors: heating pad, massage, PT  PRECAUTIONS: Other: pregnant, no DN in lumbar/sacral region  RED FLAGS: None   WEIGHT BEARING RESTRICTIONS: No  FALLS:  Has patient fallen in last 6 months? No  LIVING ENVIRONMENT: Lives with: lives with their spouse Lives in: House/apartment Stairs: Yes: Internal: 14 steps; on right going up and on left going up and External: 1 steps; none Has following equipment at home: None  OCCUPATION: full time at [employer], but works at home most of the time.   PLOF: Independent  PATIENT GOALS: "lesses the pain, strengthen the muscles, stretch things out so not as painful"  NEXT MD VISIT: weekly   OBJECTIVE:   DIAGNOSTIC FINDINGS:  No relevant imaging  PATIENT SURVEYS:  Modified Oswestry 21/50   COGNITION: Overall cognitive status: Within functional limits for tasks assessed     SENSATION: WFL  MUSCLE LENGTH: Hamstrings: Right 90 deg; Left 90 deg  POSTURE: No Significant postural limitations  PALPATION: Tenderness L4/L5, bil QL, glutes and piriformis, SIJ, greater trochanters, Right worse than left.   LUMBAR ROM:  NT  LOWER EXTREMITY ROM:   good hip mobility bilaterally for both hip internal and external rotation.    LOWER EXTREMITY MMT:    MMT Right eval Left eval  Hip flexion 4+ 4+  Hip extension    Hip abduction 5 (in SL) 5 (in SL)  Hip adduction 5 5  Knee flexion 5 5  Knee extension 5 5  Ankle dorsiflexion 5 5  Ankle plantarflexion     (Blank rows = not tested)  LUMBAR SPECIAL TESTS:  Straight leg raise test: Negative and FABER test: Negative  FUNCTIONAL TESTS:    GAIT: Distance walked: 42' Assistive device utilized: None Level of assistance: Complete Independence Comments: no significant deviation or device   TODAY'S TREATMENT:                                                                                                                               DATE:   10/16/2023 Therapeutic Exercise: to improve strength and mobility.  Demo, verbal and tactile cues throughout for technique. Nustep L5 x 4 min LE only  Manual Therapy: to decrease muscle spasm and pain and improve mobility STM/TPR to bil QL, bil glute med and max, piriformis, pin and stretch to piriformis, skilled palpation and monitoring during dry needling. Trigger Point  Dry-Needling  Treatment instructions: Expect mild to moderate muscle soreness. S/S of pneumothorax if dry needled over a lung field, and to seek immediate medical attention should they occur. Patient verbalized understanding of these instructions and education. Patient Consent Given: Yes Education handout provided: Yes Muscles treated: bil glute med/max and piriformis Electrical stimulation performed: No Parameters: N/A Treatment response/outcome: Twitch Response Elicited and Palpable Increase in Muscle Length   10/12/2023  Therapeutic Exercise: to improve strength and mobility.  Demo, verbal and tactile cues throughout for technique. Nustep L5 x 5 min LE only  Manual Therapy: to decrease muscle spasm and pain and improve mobility STM/TPR to bil QL, bil glute med and max, piriformis, pin and stretch to piriformis, skilled palpation and monitoring during dry needling. Trigger Point Dry-Needling  Treatment instructions: Expect mild to moderate muscle soreness. S/S of pneumothorax if dry needled over a lung field, and to seek immediate medical attention should they occur. Patient verbalized understanding of these instructions and education. Patient Consent Given: Yes Education handout provided: Yes Muscles treated: bil glute med/max and piriformis Electrical stimulation performed: No Parameters: N/A Treatment response/outcome: Twitch Response Elicited and Palpable Increase in Muscle Length  10/09/23 THERAPEUTIC EXERCISE: to improve flexibility, strength and mobility.  Demonstration, verbal and tactile cues  throughout for technique.  Nustep L5 x 5 min (LE only)   MANUAL THERAPY: To promote normalized muscle tension, improved flexibility, and reduced pain. Skilled palpation and monitoring of soft tissue during DN Trigger Point Dry-Needling (Pt positioned on side with table angled upward.) Treatment instructions: Expect mild to moderate muscle soreness. Patient verbalized understanding of these instructions and education. Patient Consent Given: Yes Education handout provided: Previously provided Muscles treated: B glute max/med/min and piriformis Electrical stimulation performed: No Parameters: N/A Treatment response/outcome: Twitch Response Elicited and Palpable Increase in Muscle Length STM/DTM, manual TPR and pin & stretch to muscles addressed with DN, as well as QL   PATIENT EDUCATION:  Education details: continue HEP Person educated: Patient Education method: Explanation Education comprehension: verbalized understanding  HOME EXERCISE PROGRAM: Access Code: [email or web address] URL: [email or web address] Date: 08/28/2023 Prepared by: Harrie Foreman  Exercises - Supine Diaphragmatic Breathing  - 1 x daily - 7 x weekly - 1 sets - 10 reps - Seated Diaphragmatic Breathing  - 1 x daily - 7 x weekly - 1 sets - 10 reps - Supine Posterior Pelvic Tilt  - 1 x daily - 7 x weekly - 1 sets - 10 reps - Seated Pelvic Tilt  - 1 x daily - 7 x weekly - 1 sets - 10 reps - Posterior Pelvic Tilt with Adduction Using Pillow in Hooklying  - 1 x daily - 7 x weekly - 1 sets - 10 reps - Bent Knee Fallouts  - 1 x daily - 7 x weekly - 1 sets - 10 reps - Supine Posterior Pelvic Tilt with Knee Rocks  - 1 x daily - 7 x weekly - 1 sets - 10 reps   ASSESSMENT:  CLINICAL IMPRESSION: Desmond continues to note benefit from MT and DN as she feels that this reduces the muscle strain pulling against her back, therefore continued with focus on manual therapy with good tolerance and report of decreased tightness  and pain by end of session.  Gates continues to demonstrate potential for improvement and would benefit from continued skilled therapy to address impairments.    OBJECTIVE IMPAIRMENTS: decreased activity tolerance, increased fascial restrictions, increased muscle spasms, and pain.   ACTIVITY LIMITATIONS: carrying, lifting, bending, sleeping,  bed mobility, and locomotion level  PARTICIPATION LIMITATIONS: meal prep, cleaning, laundry, shopping, and community activity  PERSONAL FACTORS: Past/current experiences and 1-2 comorbidities: history of LBP, pregnancy, obesity   are also affecting patient's functional outcome.   REHAB POTENTIAL: Good  CLINICAL DECISION MAKING: Evolving/moderate complexity  EVALUATION COMPLEXITY: Moderate   GOALS: Goals reviewed with patient? Yes  SHORT TERM GOALS: Target date: 09/07/2023   Patient will be independent with initial HEP.  Baseline:  Given 08/28/23 Goal status: MET 09/04/23   LONG TERM GOALS: Target date: 11/16/2023   Patient will be independent with advanced/ongoing HEP to improve outcomes and carryover.  Baseline:   Goal status: IN PROGRESS  2.  Patient will report 50% improvement in low back pain/R hip pain to improve QOL.  Baseline:  Goal status: IN PROGRESS  3.  Patient will demonstrate full pain free lumbar ROM to perform ADLs.   Baseline:  Goal status: IN PROGRESS  4.  Patient will report at least 6 points improvement on modified Oswestry to demonstrate improved functional ability.  Baseline: 21/50 Goal status: IN PROGRESS   5. Patient will report 50% improvement in sleep quality  Baseline: increased pain at night preventing sleep Goal status: IN PROGRESS   PLAN:  PT FREQUENCY: 2x/week for 6-8 weeks decreasing to 1x per week when appropriate for 12 weeks total  PT DURATION: 12 weeks  PLANNED INTERVENTIONS: Therapeutic exercises, Therapeutic activity, Neuromuscular re-education, Balance training, Gait training,  Patient/Family education, Self Care, Joint mobilization, Dry Needling, Electrical stimulation, Spinal mobilization, Cryotherapy, Moist heat, Manual therapy, and Re-evaluation.  PLAN FOR NEXT SESSION: core stabilization exercises, can lie on back for short periods but try in semi-reclined, manual therapy, TrDN to glutes/TFL only, no needling in spine region.  Consider Ktape to abdomen also.     Jena Gauss, PT, DPT  10/16/2023, 9:33 AM

## 2023-10-19 ENCOUNTER — Encounter: Payer: 59 | Admitting: Physical Therapy

## 2023-10-23 ENCOUNTER — Ambulatory Visit: Payer: 59 | Attending: Obstetrics and Gynecology | Admitting: Physical Therapy

## 2023-10-23 ENCOUNTER — Encounter: Payer: Self-pay | Admitting: Physical Therapy

## 2023-10-23 DIAGNOSIS — M5459 Other low back pain: Secondary | ICD-10-CM | POA: Diagnosis present

## 2023-10-23 DIAGNOSIS — M25551 Pain in right hip: Secondary | ICD-10-CM | POA: Diagnosis present

## 2023-10-23 DIAGNOSIS — R252 Cramp and spasm: Secondary | ICD-10-CM | POA: Insufficient documentation

## 2023-10-23 NOTE — Therapy (Signed)
OUTPATIENT PHYSICAL THERAPY TREATMENT   Patient Name: Debra [name] MRN: [id number] DOB:[birth year], 42 y.o., female Today's Date: 10/23/2023  END OF SESSION:  PT End of Session - 10/23/23 0849     Visit Number 13    Date for PT Re-Evaluation 11/16/23    Authorization Type UHC [id number]    PT Start Time 0848    PT Stop Time 0928    PT Time Calculation (min) 40 min    Activity Tolerance Patient tolerated treatment well    Behavior During Therapy [facility] for tasks assessed/performed              Past Medical History:  Diagnosis Date   Acne    Allergic rhinitis    Cold sore 11/02/2016   Depression    GERD (gastroesophageal reflux disease)    Hearing loss of both ears    per pt right > left,  does not wear hearing aids   IDA (iron deficiency anemia)    Incompetent cervix    Infertility, female    Mild obstructive sleep apnea    per pt had sleep study was very mild osa,  did not meet critiria for insurance to pay but pt paid out of pocket since it helps due to dust mite allergy   Prior pregnancy with incompetent cervix in second trimester, antepartum 04/01/2022   03-31-2022 pre-mature ruptured membrane [redacted]wk gestation, nonviable   Uterine polyp    Past Surgical History:  Procedure Laterality Date   CERCLAGE LAPAROSCOPIC ABDOMINAL N/A 07/27/2022   Procedure: CERCLAGE LAPAROSCOPIC CERVICO- ABDOMINAL AND LYSIS OF ADHESIONS;  Surgeon: Fermin Schwab, MD;  Location: [facility];  Service: Gynecology;  Laterality: N/A;   DILATION AND CURETTAGE OF UTERUS N/A 04/01/2022   Procedure: DILATATION AND CURETTAGE;  Surgeon: Shea Evans, MD;  Location: [facility];  Service: Gynecology;  Laterality: N/A;   HYSTEROSCOPY N/A 07/27/2022   Procedure: HYSTEROSCOPY WITH ENDOMETRIAL BIOPSY;  Surgeon: Fermin Schwab, MD;  Location: [facility];  Service: Gynecology;  Laterality: N/A;   Patient Active Problem List   Diagnosis Date Noted   Sleep apnea-like  behavior 09/19/2023   Anemia of pregnancy 07/14/2023   Pregnancy 07/03/2023   Pharyngitis 09/18/2022   Well adult exam 06/08/2022   Preterm delivery 04/01/2022   Incompetent cervix in pregnancy, antepartum, second trimester 04/01/2022   Pregnancy resulting from in vitro fertilization in second trimester 03/31/2022   Premature rupture of membranes in second trimester 03/31/2022   Allergic conjunctivitis of both eyes 12/16/2020   Allergic conjunctivitis 09/29/2020   Insomnia 07/18/2018   Elevated ALT measurement 07/18/2018   Snoring 01/03/2017   Chronic low back pain without sciatica 01/03/2017   Vitamin D deficiency 11/02/2016   Morbid obesity with BMI of 40.0-44.9, adult (HCC) 11/02/2016   Dermatographia 11/02/2016   Cold sore 11/02/2016   Perennial allergic rhinitis 11/02/2016   Acne 11/02/2016   Depressive disorder 11/25/2014   Herpes simplex 12/02/2013   Esophageal reflux 12/02/2013    PCP: Clayborne Dana, NP   REFERRING PROVIDER: Olivia Mackie, MD   REFERRING DIAG: M54.50 (ICD-10-CM) - Low back pain, unspecified   Rationale for Evaluation and Treatment: Rehabilitation  THERAPY DIAG:  Other low back pain  Pain in right hip  Cramp and spasm  ONSET DATE: ~ 1 month   SUBJECTIVE:  SUBJECTIVE STATEMENT: Doing well today, although having a lot more swelling now, getting some tingling in arms and swelling in ankles.    PERTINENT HISTORY:  [redacted] weeks pregnant, h/o anemia, leukocytosis, LBP  PAIN:  Are you having pain? Yes: NPRS scale: low back & sacrum 4/10; glutes 4/10 Pain location:  low back, R hip/glutes  Pain description: dull, throbbing in low back & sacrum; intermittent low throbbing in glutes and thighs  Aggravating factors: 7-8/10 at worst, laying on side Relieving factors:  heating pad, massage, PT  PRECAUTIONS: Other: pregnant, no DN in lumbar/sacral region  RED FLAGS: None   WEIGHT BEARING RESTRICTIONS: No  FALLS:  Has patient fallen in last 6 months? No  LIVING ENVIRONMENT: Lives with: lives with their spouse Lives in: House/apartment Stairs: Yes: Internal: 14 steps; on right going up and on left going up and External: 1 steps; none Has following equipment at home: None  OCCUPATION: full time at [employer], but works at home most of the time.   PLOF: Independent  PATIENT GOALS: "lesses the pain, strengthen the muscles, stretch things out so not as painful"  NEXT MD VISIT: weekly   OBJECTIVE:   DIAGNOSTIC FINDINGS:  No relevant imaging  PATIENT SURVEYS:  Modified Oswestry 21/50   COGNITION: Overall cognitive status: Within functional limits for tasks assessed     SENSATION: WFL  MUSCLE LENGTH: Hamstrings: Right 90 deg; Left 90 deg  POSTURE: No Significant postural limitations  PALPATION: Tenderness L4/L5, bil QL, glutes and piriformis, SIJ, greater trochanters, Right worse than left.   LUMBAR ROM:  NT  LOWER EXTREMITY ROM:   good hip mobility bilaterally for both hip internal and external rotation.    LOWER EXTREMITY MMT:    MMT Right eval Left eval  Hip flexion 4+ 4+  Hip extension    Hip abduction 5 (in SL) 5 (in SL)  Hip adduction 5 5  Knee flexion 5 5  Knee extension 5 5  Ankle dorsiflexion 5 5  Ankle plantarflexion     (Blank rows = not tested)  LUMBAR SPECIAL TESTS:  Straight leg raise test: Negative and FABER test: Negative  FUNCTIONAL TESTS:    GAIT: Distance walked: 38' Assistive device utilized: None Level of assistance: Complete Independence Comments: no significant deviation or device   TODAY'S TREATMENT:                                                                                                                              DATE:   10/23/2023 Therapeutic Exercise: to improve strength  and mobility.  Demo, verbal and tactile cues throughout for technique. Nustep L5 x 5 min  Manual Therapy: to decrease muscle spasm and pain and improve mobility STM/TPR to bil QL, lumbar paraspinals, bil glute med and max, piriformis, skilled palpation and monitoring during dry needling. Trigger Point Dry-Needling  Treatment instructions: Expect mild to moderate muscle soreness. S/S of pneumothorax if dry needled over a lung  field, and to seek immediate medical attention should they occur. Patient verbalized understanding of these instructions and education. Patient Consent Given: Yes Education handout provided: Yes Muscles treated: bil glute med/max and piriformis Electrical stimulation performed: No Parameters: N/A Treatment response/outcome: Twitch Response Elicited and Palpable Increase in Muscle Length   10/16/2023 Therapeutic Exercise: to improve strength and mobility.  Demo, verbal and tactile cues throughout for technique. Nustep L5 x 4 min LE only  Manual Therapy: to decrease muscle spasm and pain and improve mobility STM/TPR to bil QL, bil glute med and max, piriformis, pin and stretch to piriformis, skilled palpation and monitoring during dry needling. Trigger Point Dry-Needling  Treatment instructions: Expect mild to moderate muscle soreness. S/S of pneumothorax if dry needled over a lung field, and to seek immediate medical attention should they occur. Patient verbalized understanding of these instructions and education. Patient Consent Given: Yes Education handout provided: Yes Muscles treated: bil glute med/max and piriformis Electrical stimulation performed: No Parameters: N/A Treatment response/outcome: Twitch Response Elicited and Palpable Increase in Muscle Length   10/12/2023  Therapeutic Exercise: to improve strength and mobility.  Demo, verbal and tactile cues throughout for technique. Nustep L5 x 5 min LE only  Manual Therapy: to decrease muscle spasm and pain and  improve mobility STM/TPR to bil QL, bil glute med and max, piriformis, pin and stretch to piriformis, skilled palpation and monitoring during dry needling. Trigger Point Dry-Needling  Treatment instructions: Expect mild to moderate muscle soreness. S/S of pneumothorax if dry needled over a lung field, and to seek immediate medical attention should they occur. Patient verbalized understanding of these instructions and education. Patient Consent Given: Yes Education handout provided: Yes Muscles treated: bil glute med/max and piriformis Electrical stimulation performed: No Parameters: N/A Treatment response/outcome: Twitch Response Elicited and Palpable Increase in Muscle Length   PATIENT EDUCATION:  Education details: continue HEP Person educated: Patient Education method: Explanation Education comprehension: verbalized understanding  HOME EXERCISE PROGRAM: Access Code: [email or web address] URL: [email or web address] Date: 08/28/2023 Prepared by: Harrie Foreman  Exercises - Supine Diaphragmatic Breathing  - 1 x daily - 7 x weekly - 1 sets - 10 reps - Seated Diaphragmatic Breathing  - 1 x daily - 7 x weekly - 1 sets - 10 reps - Supine Posterior Pelvic Tilt  - 1 x daily - 7 x weekly - 1 sets - 10 reps - Seated Pelvic Tilt  - 1 x daily - 7 x weekly - 1 sets - 10 reps - Posterior Pelvic Tilt with Adduction Using Pillow in Hooklying  - 1 x daily - 7 x weekly - 1 sets - 10 reps - Bent Knee Fallouts  - 1 x daily - 7 x weekly - 1 sets - 10 reps - Supine Posterior Pelvic Tilt with Knee Rocks  - 1 x daily - 7 x weekly - 1 sets - 10 reps   ASSESSMENT:  CLINICAL IMPRESSION: Hayle reporting less tightness in bil hips, pain more in low back now.  Noted less irritability especially in L glutes compaired to R side, today, focused more on STM/TPR to lumbar paraspinals today. Also discussed using wrist splints at night for possible carpal tunnel and elevating legs during day for swelling.   Shelli continues to demonstrate potential for improvement and would benefit from continued skilled therapy to address impairments.    OBJECTIVE IMPAIRMENTS: decreased activity tolerance, increased fascial restrictions, increased muscle spasms, and pain.   ACTIVITY LIMITATIONS: carrying, lifting, bending, sleeping, bed mobility, and locomotion level  PARTICIPATION LIMITATIONS: meal prep, cleaning, laundry, shopping, and community activity  PERSONAL FACTORS: Past/current experiences and 1-2 comorbidities: history of LBP, pregnancy, obesity   are also affecting patient's functional outcome.   REHAB POTENTIAL: Good  CLINICAL DECISION MAKING: Evolving/moderate complexity  EVALUATION COMPLEXITY: Moderate   GOALS: Goals reviewed with patient? Yes  SHORT TERM GOALS: Target date: 09/07/2023   Patient will be independent with initial HEP.  Baseline:  Given 08/28/23 Goal status: MET 09/04/23   LONG TERM GOALS: Target date: 11/16/2023   Patient will be independent with advanced/ongoing HEP to improve outcomes and carryover.  Baseline:   Goal status: IN PROGRESS  2.  Patient will report 50% improvement in low back pain/R hip pain to improve QOL.  Baseline:  Goal status: IN PROGRESS  3.  Patient will demonstrate full pain free lumbar ROM to perform ADLs.   Baseline:  Goal status: IN PROGRESS  4.  Patient will report at least 6 points improvement on modified Oswestry to demonstrate improved functional ability.  Baseline: 21/50 Goal status: IN PROGRESS   5. Patient will report 50% improvement in sleep quality  Baseline: increased pain at night preventing sleep Goal status: IN PROGRESS   PLAN:  PT FREQUENCY: 2x/week for 6-8 weeks decreasing to 1x per week when appropriate for 12 weeks total  PT DURATION: 12 weeks  PLANNED INTERVENTIONS: Therapeutic exercises, Therapeutic activity, Neuromuscular re-education, Balance training, Gait training, Patient/Family education, Self Care,  Joint mobilization, Dry Needling, Electrical stimulation, Spinal mobilization, Cryotherapy, Moist heat, Manual therapy, and Re-evaluation.  PLAN FOR NEXT SESSION: core stabilization exercises, can lie on back for short periods but try in semi-reclined, manual therapy, TrDN to glutes/TFL only, no needling in spine region.  Consider Ktape to abdomen also.     Jena Gauss, PT, DPT  10/23/2023, 12:48 PM

## 2023-10-25 ENCOUNTER — Encounter: Payer: 59 | Admitting: Physical Therapy

## 2023-10-26 ENCOUNTER — Inpatient Hospital Stay (HOSPITAL_COMMUNITY)
Admission: AD | Admit: 2023-10-26 | Discharge: 2023-10-26 | Disposition: A | Discharge status: Home / Self Care | Payer: 59 | Attending: Obstetrics & Gynecology | Admitting: Obstetrics & Gynecology

## 2023-10-26 ENCOUNTER — Encounter (HOSPITAL_COMMUNITY): Payer: Self-pay | Admitting: Obstetrics & Gynecology

## 2023-10-26 DIAGNOSIS — O99891 Other specified diseases and conditions complicating pregnancy: Secondary | ICD-10-CM | POA: Insufficient documentation

## 2023-10-26 DIAGNOSIS — O26893 Other specified pregnancy related conditions, third trimester: Secondary | ICD-10-CM | POA: Insufficient documentation

## 2023-10-26 DIAGNOSIS — Z3A30 30 weeks gestation of pregnancy: Secondary | ICD-10-CM

## 2023-10-26 DIAGNOSIS — R112 Nausea with vomiting, unspecified: Secondary | ICD-10-CM | POA: Diagnosis not present

## 2023-10-26 DIAGNOSIS — O212 Late vomiting of pregnancy: Secondary | ICD-10-CM | POA: Diagnosis present

## 2023-10-26 DIAGNOSIS — O3433 Maternal care for cervical incompetence, third trimester: Secondary | ICD-10-CM | POA: Diagnosis not present

## 2023-10-26 DIAGNOSIS — O99613 Diseases of the digestive system complicating pregnancy, third trimester: Secondary | ICD-10-CM | POA: Diagnosis not present

## 2023-10-26 DIAGNOSIS — O1493 Unspecified pre-eclampsia, third trimester: Secondary | ICD-10-CM | POA: Diagnosis not present

## 2023-10-26 DIAGNOSIS — O1213 Gestational proteinuria, third trimester: Secondary | ICD-10-CM | POA: Insufficient documentation

## 2023-10-26 DIAGNOSIS — R197 Diarrhea, unspecified: Secondary | ICD-10-CM | POA: Diagnosis not present

## 2023-10-26 DIAGNOSIS — O99353 Diseases of the nervous system complicating pregnancy, third trimester: Secondary | ICD-10-CM | POA: Insufficient documentation

## 2023-10-26 LAB — CBC
HCT: 39.8 % (ref 36.0–46.0)
Hemoglobin: 12.8 g/dL (ref 12.0–15.0)
MCH: 25.9 pg — ABNORMAL LOW (ref 26.0–34.0)
MCHC: 32.2 g/dL (ref 30.0–36.0)
MCV: 80.4 fL (ref 80.0–100.0)
Platelets: 236 10*3/uL (ref 150–400)
RBC: 4.95 MIL/uL (ref 3.87–5.11)
RDW: 14.5 % (ref 11.5–15.5)
WBC: 13 10*3/uL — ABNORMAL HIGH (ref 4.0–10.5)
nRBC: 0 % (ref 0.0–0.2)

## 2023-10-26 LAB — COMPREHENSIVE METABOLIC PANEL
ALT: 15 U/L (ref 0–44)
AST: 25 U/L (ref 15–41)
Albumin: 2.9 g/dL — ABNORMAL LOW (ref 3.5–5.0)
Alkaline Phosphatase: 82 U/L (ref 38–126)
Anion gap: 10 (ref 5–15)
BUN: 13 mg/dL (ref 6–20)
CO2: 18 mmol/L — ABNORMAL LOW (ref 22–32)
Calcium: 8.8 mg/dL — ABNORMAL LOW (ref 8.9–10.3)
Chloride: 107 mmol/L (ref 98–111)
Creatinine, Ser: 0.63 mg/dL (ref 0.44–1.00)
GFR, Estimated: >60 mL/min (ref >60)
Glucose, Bld: 106 mg/dL — ABNORMAL HIGH (ref 70–99)
Potassium: 4 mmol/L (ref 3.5–5.1)
Sodium: 135 mmol/L (ref 135–145)
Total Bilirubin: 0.4 mg/dL (ref <1.2)
Total Protein: 6.6 g/dL (ref 6.5–8.1)

## 2023-10-26 LAB — URINALYSIS, ROUTINE W REFLEX MICROSCOPIC
Bilirubin Urine: NEGATIVE
Glucose, UA: NEGATIVE mg/dL
Hgb urine dipstick: NEGATIVE
Ketones, ur: NEGATIVE mg/dL
Leukocytes,Ua: NEGATIVE
Nitrite: NEGATIVE
Protein, ur: >=300 mg/dL — AB
Specific Gravity, Urine: 1.024 (ref 1.005–1.030)
pH: 5 (ref 5.0–8.0)

## 2023-10-26 LAB — PROTEIN / CREATININE RATIO, URINE
Creatinine, Urine: 153 mg/dL
Protein Creatinine Ratio: 2.76 mg/mg{creat} — ABNORMAL HIGH (ref 0.00–0.15)
Total Protein, Urine: 422 mg/dL

## 2023-10-26 MED ORDER — ONDANSETRON HCL 4 MG/2ML IJ SOLN
4.0000 mg | Freq: Once | INTRAMUSCULAR | Status: AC
  Administered 2023-10-26: 4 mg via INTRAVENOUS
  Filled 2023-10-26: qty 2

## 2023-10-26 MED ORDER — ONDANSETRON 4 MG PO TBDP: 4.0000 mg | ORAL_TABLET | Freq: Four times a day (QID) | ORAL | 0 refills | Status: DC | PRN

## 2023-10-26 NOTE — MAU Note (Signed)
..  Debra [name] is a 42 y.o. at [redacted]w[redacted]d here in MAU reporting: has been feeling ill all day; body aches, congestion,  Nausea and vomiting that began around midnight; has had one episode of diarrhea since then.  Reports stomach cramping with the vomiting but not lower abdominal cramping  Denies vaginal bleeding or leaking of fluid. +FM. Denies contractions  Pain score:  Vitals:   10/26/23 0134  BP: (!) 165/89  Pulse: (!) 123  Resp: 18  Temp: 98 F (36.7 C)  SpO2: 100%     FHT: Lab orders placed from triage:  UA

## 2023-10-26 NOTE — Progress Notes (Signed)
Pt reports taking nifedipine at 2100.

## 2023-10-26 NOTE — MAU Provider Note (Signed)
Chief Complaint:  Emesis and Nausea   Event Date/Time   First Provider Initiated Contact with Patient 10/26/23 0202     HPI: Debra [name] is a 42 y.o. G3P0010 at 39w5dwho presents to maternity admissions reporting onset of vomiting at [facility].  Went to dinner and felt some loss of appetite but ate anyway. .vomited 3 times with diarrhea on second time.  No abdominal pain.  She reports good fetal movement, denies LOF, vaginal bleeding, urinary symptoms, h/a, dizziness, constipation or fever/chills.  She denies headache, visual changes or RUQ abdominal pain.  Emesis  This is a new problem. The current episode started today. The problem occurs 2 to 4 times per day. There has been no fever. Associated symptoms include diarrhea. Pertinent negatives include no abdominal pain, chills, dizziness or fever. She has tried nothing for the symptoms.   Had Gestational vs Chronic hypertension   Took Procardia this evening Denies headache or visual changes States BP is always elevated here due to prior pregnancy experiences/anxiety.  RN Note: .Debra [name] is a 42 y.o. at [redacted]w[redacted]d here in MAU reporting: has been feeling ill all day; body aches, congestion,  Nausea and vomiting that began around midnight; has had one episode of diarrhea since then.  Reports stomach cramping with the vomiting but not lower abdominal cramping  Denies vaginal bleeding or leaking of fluid. +FM. Denies contractions  Past Medical History: Past Medical History:  Diagnosis Date   Acne    Allergic rhinitis    Cold sore 11/02/2016   Depression    GERD (gastroesophageal reflux disease)    Hearing loss of both ears    per pt right > left,  does not wear hearing aids   IDA (iron deficiency anemia)    Incompetent cervix    Infertility, female    Mild obstructive sleep apnea    per pt had sleep study was very mild osa,  did not meet critiria for insurance to pay but pt paid out of pocket since it helps due to dust  mite allergy   Prior pregnancy with incompetent cervix in second trimester, antepartum 04/01/2022   03-31-2022 pre-mature ruptured membrane [redacted]wk gestation, nonviable   Uterine polyp     Past obstetric history: OB History  Gravida Para Term Preterm AB Living  3 1     1     SAB IAB Ectopic Multiple Live Births  1     0      # Outcome Date GA Lbr Len/2nd Weight Sex Type Anes PTL Lv  3 Current           2 SAB 09/2022          1 Para 04/01/22 [redacted]w[redacted]d 12:25 / 00:19 365 g F Vag-Breech None  FD    Past Surgical History: Past Surgical History:  Procedure Laterality Date   CERCLAGE LAPAROSCOPIC ABDOMINAL N/A 07/27/2022   Procedure: CERCLAGE LAPAROSCOPIC CERVICO- ABDOMINAL AND LYSIS OF ADHESIONS;  Surgeon: Fermin Schwab, MD;  Location: [facility];  Service: Gynecology;  Laterality: N/A;   DILATION AND CURETTAGE OF UTERUS N/A 04/01/2022   Procedure: DILATATION AND CURETTAGE;  Surgeon: Shea Evans, MD;  Location: [facility];  Service: Gynecology;  Laterality: N/A;   HYSTEROSCOPY N/A 07/27/2022   Procedure: HYSTEROSCOPY WITH ENDOMETRIAL BIOPSY;  Surgeon: Fermin Schwab, MD;  Location: [facility];  Service: Gynecology;  Laterality: N/A;    Family History: Family History  Problem Relation Age of Onset   Breast cancer Mother  Allergic rhinitis Mother    Eczema Mother    Allergic rhinitis Father    Allergic rhinitis Brother     Social History: Social History   Tobacco Use   Smoking status: Never   Smokeless tobacco: Never  Vaping Use   Vaping status: Never Used  Substance Use Topics   Alcohol use: Not Currently    Comment: occasional   Drug use: Never    Allergies:  Allergies  Allergen Reactions   Dust Mite Extract Other (See Comments)    Meds:  Medications Prior to Admission  Medication Sig Dispense Refill Last Dose   aspirin EC (ASPIRIN LOW DOSE) 81 MG tablet    Past Week   Cholecalciferol (VITAMIN D3) 125 MCG (5000 UT) CAPS Take 1  capsule by mouth daily.   10/25/2023   Ergocalciferol (VITAMIN D2 PO) Take 1,000 Int'l Units/day by mouth daily.   10/25/2023   NIFEdipine (PROCARDIA-XL/NIFEDICAL-XL) 30 MG 24 hr tablet Take 30 mg by mouth daily.   10/25/2023 at 2100   Prenatal MV-Min-Fe Fum-FA-DHA (PRENATAL+DHA PO)    10/25/2023   PSYLLIUM FIBER PO Take 3,000 mg by mouth daily.   10/25/2023   Riboflavin (B-2-400 PO) Take 1 tablet by mouth daily.   10/25/2023   Vitamin A 3 MG (10000 UT) TABS Take 1 tablet by mouth daily.   10/25/2023   Azelaic Acid 15 % gel APPLY 1 APPLICATION TOPICALLY TWICE A DAY TO FACE      calcium citrate (CALCITRATE - DOSED IN MG ELEMENTAL CALCIUM) 950 (200 Ca) MG tablet Take 4,200 mg of elemental calcium by mouth daily.      diphenhydramine-acetaminophen (TYLENOL PM) 25-500 MG TABS tablet Take 1 tablet by mouth at bedtime as needed.      lansoprazole (PREVACID) 15 MG capsule Take 15 mg by mouth daily at 12 noon.      loratadine (CLARITIN) 10 MG tablet Take 10 mg by mouth daily.      Omega-3 Fatty Acids (OMEGA-3 FISH OIL PO) Take 5,000 mg by mouth daily.      OVER THE COUNTER MEDICATION Take 1 tablet by mouth daily. Lions' mane supplement (mushroom extract)  2000 mg      Prenatal Vit-Fe Fumarate-FA (PRENATAL MULTIVITAMIN) TABS tablet Take 1 tablet by mouth daily at 12 noon.       I have reviewed patient's Past Medical Hx, Surgical Hx, Family Hx, Social Hx, medications and allergies.   ROS:  Review of Systems  Constitutional:  Negative for chills and fever.  Gastrointestinal:  Positive for diarrhea and vomiting. Negative for abdominal pain.  Neurological:  Negative for dizziness.   Other systems negative  Physical Exam    Vitals:   10/26/23 0134 10/26/23 0200 10/26/23 0216 10/26/23 0231  BP: (!) 165/89 (!) 156/123 (!) 157/105 135/75   10/26/23 0246 10/26/23 0301 10/26/23 0316  BP: 126/77 138/89 138/82   Vitals:   10/26/23 0231 10/26/23 0246 10/26/23 0301 10/26/23 0316  BP: 135/75 126/77 138/89  138/82  Pulse: 96 96 95 87  Resp:      Temp:      TempSrc:      SpO2: 98%     Weight:      Height:        Constitutional: Well-developed, well-nourished female in no acute distress.  Cardiovascular: normal rate and rhythm Respiratory: normal effort GI: Abd soft, non-tender, gravid appropriate for gestational age.   No rebound or guarding. MS: Extremities nontender, no edema, normal ROM Neurologic: Alert and  oriented x 4.  GU: Neg CVAT.    FHT:  Baseline 140 , moderate variability, accelerations present, no decelerations Contractions:  Rare   Labs: Results for orders placed or performed during the hospital encounter of 10/26/23 (from the past 24 hour(s))  Urinalysis, Routine w reflex microscopic -Urine, Clean Catch     Status: Abnormal   Collection Time: 10/26/23  1:41 AM  Result Value Ref Range   Color, Urine YELLOW YELLOW   APPearance HAZY (A) CLEAR   Specific Gravity, Urine 1.024 1.005 - 1.030   pH 5.0 5.0 - 8.0   Glucose, UA NEGATIVE NEGATIVE mg/dL   Hgb urine dipstick NEGATIVE NEGATIVE   Bilirubin Urine NEGATIVE NEGATIVE   Ketones, ur NEGATIVE NEGATIVE mg/dL   Protein, ur >=161 (A) NEGATIVE mg/dL   Nitrite NEGATIVE NEGATIVE   Leukocytes,Ua NEGATIVE NEGATIVE   RBC / HPF 0-5 0 - 5 RBC/hpf   WBC, UA 0-5 0 - 5 WBC/hpf   Bacteria, UA RARE (A) NONE SEEN   Squamous Epithelial / HPF 0-5 0 - 5 /HPF   Mucus PRESENT    Hyaline Casts, UA PRESENT   Protein / creatinine ratio, urine     Status: Abnormal   Collection Time: 10/26/23  1:41 AM  Result Value Ref Range   Creatinine, Urine 153 mg/dL   Total Protein, Urine 422 mg/dL   Protein Creatinine Ratio 2.76 (H) 0.00 - 0.15 mg/mg[Cre]  CBC     Status: Abnormal   Collection Time: 10/26/23  2:03 AM  Result Value Ref Range   WBC 13.0 (H) 4.0 - 10.5 K/uL   RBC 4.95 3.87 - 5.11 MIL/uL   Hemoglobin 12.8 12.0 - 15.0 g/dL   HCT 09.6 04.5 - 40.9 %   MCV 80.4 80.0 - 100.0 fL   MCH 25.9 (L) 26.0 - 34.0 pg   MCHC 32.2 30.0 - 36.0  g/dL   RDW 81.1 91.4 - 78.2 %   Platelets 236 150 - 400 K/uL   nRBC 0.0 0.0 - 0.2 %  Comprehensive metabolic panel     Status: Abnormal   Collection Time: 10/26/23  2:03 AM  Result Value Ref Range   Sodium 135 135 - 145 mmol/L   Potassium 4.0 3.5 - 5.1 mmol/L   Chloride 107 98 - 111 mmol/L   CO2 18 (L) 22 - 32 mmol/L   Glucose, Bld 106 (H) 70 - 99 mg/dL   BUN 13 6 - 20 mg/dL   Creatinine, Ser 9.56 0.44 - 1.00 mg/dL   Calcium 8.8 (L) 8.9 - 10.3 mg/dL   Total Protein 6.6 6.5 - 8.1 g/dL   Albumin 2.9 (L) 3.5 - 5.0 g/dL   AST 25 15 - 41 U/L   ALT 15 0 - 44 U/L   Alkaline Phosphatase 82 38 - 126 U/L   Total Bilirubin 0.4 <1.2 mg/dL   GFR, Estimated >21 >30 mL/min   Anion gap 10 5 - 15    Imaging:  No results found.  MAU Course/MDM: I have reviewed the triage vital signs and the nursing notes.   Pertinent labs & imaging results that were available during my care of the patient were reviewed by me and considered in my medical decision making (see chart for details).      I have reviewed her medical records including past results, notes and treatments.   I have ordered labs and reviewed results. These are normal except for elevated Protein/Creatinine Ratio NST reviewed  Treatments in MAU included EFM,  Zofran  Discussed results and implications for preeclampsia.  Blood pressures have greatly improved, likely due to evening med kicking in Has appointment at 2pm today and will notify them of new PCR results Reviewed preeclampsia precautions.    Assessment: Single IUP at [redacted]w[redacted]d Nausea/vomiting/diarrhea, likely viral gastroenteritis New elevated proteinuria, likely representing preeclampsia  Plan: Discharge home Rx zofran for nausea Advance diet as tolerated Preeclampsia precautions Labor precautions and fetal kick counts Follow up in Office for prenatal visits and recheck Encouraged to return if she develops worsening of symptoms, increase in pain, fever, or other concerning  symptoms.   Pt stable at time of discharge.  Wynelle Bourgeois CNM, MSN Certified Nurse-Midwife 10/26/2023 2:02 AM

## 2023-10-30 ENCOUNTER — Ambulatory Visit: Payer: 59 | Admitting: Physical Therapy

## 2023-10-30 ENCOUNTER — Encounter: Payer: Self-pay | Admitting: Physical Therapy

## 2023-10-30 DIAGNOSIS — M25551 Pain in right hip: Secondary | ICD-10-CM

## 2023-10-30 DIAGNOSIS — M5459 Other low back pain: Secondary | ICD-10-CM

## 2023-10-30 DIAGNOSIS — R252 Cramp and spasm: Secondary | ICD-10-CM

## 2023-10-30 NOTE — Therapy (Signed)
OUTPATIENT PHYSICAL THERAPY TREATMENT   Patient Name: Tyana Swaziland Sahagian MRN: [id number] DOB:12-19-1981, 42 y.o., female Today's Date: 10/30/2023  END OF SESSION:  PT End of Session - 10/30/23 0853     Visit Number 14    Date for PT Re-Evaluation 11/16/23    Authorization Type UHC [id number]    PT Start Time 0848    PT Stop Time 0928    PT Time Calculation (min) 40 min    Activity Tolerance Patient tolerated treatment well    Behavior During Therapy [facility] for tasks assessed/performed              Past Medical History:  Diagnosis Date   Acne    Allergic rhinitis    Cold sore 11/02/2016   Depression    GERD (gastroesophageal reflux disease)    Hearing loss of both ears    per pt right > left,  does not wear hearing aids   IDA (iron deficiency anemia)    Incompetent cervix    Infertility, female    Mild obstructive sleep apnea    per pt had sleep study was very mild osa,  did not meet critiria for insurance to pay but pt paid out of pocket since it helps due to dust mite allergy   Prior pregnancy with incompetent cervix in second trimester, antepartum 04/01/2022   03-31-2022 pre-mature ruptured membrane [redacted]wk gestation, nonviable   Uterine polyp    Past Surgical History:  Procedure Laterality Date   CERCLAGE LAPAROSCOPIC ABDOMINAL N/A 07/27/2022   Procedure: CERCLAGE LAPAROSCOPIC CERVICO- ABDOMINAL AND LYSIS OF ADHESIONS;  Surgeon: Fermin Schwab, MD;  Location: [facility];  Service: Gynecology;  Laterality: N/A;   DILATION AND CURETTAGE OF UTERUS N/A 04/01/2022   Procedure: DILATATION AND CURETTAGE;  Surgeon: Shea Evans, MD;  Location: [facility];  Service: Gynecology;  Laterality: N/A;   HYSTEROSCOPY N/A 07/27/2022   Procedure: HYSTEROSCOPY WITH ENDOMETRIAL BIOPSY;  Surgeon: Fermin Schwab, MD;  Location: [facility];  Service: Gynecology;  Laterality: N/A;   Patient Active Problem List   Diagnosis Date Noted   Sleep apnea-like  behavior 09/19/2023   Anemia of pregnancy 07/14/2023   Pregnancy 07/03/2023   Pharyngitis 09/18/2022   Well adult exam 06/08/2022   Preterm delivery 04/01/2022   Incompetent cervix in pregnancy, antepartum, second trimester 04/01/2022   Pregnancy resulting from in vitro fertilization in second trimester 03/31/2022   Premature rupture of membranes in second trimester 03/31/2022   Allergic conjunctivitis of both eyes 12/16/2020   Allergic conjunctivitis 09/29/2020   Insomnia 07/18/2018   Elevated ALT measurement 07/18/2018   Snoring 01/03/2017   Chronic low back pain without sciatica 01/03/2017   Vitamin D deficiency 11/02/2016   Morbid obesity with BMI of 40.0-44.9, adult (HCC) 11/02/2016   Dermatographia 11/02/2016   Cold sore 11/02/2016   Perennial allergic rhinitis 11/02/2016   Acne 11/02/2016   Depressive disorder 11/25/2014   Herpes simplex 12/02/2013   Esophageal reflux 12/02/2013    PCP: Clayborne Dana, NP   REFERRING PROVIDER: Olivia Mackie, MD   REFERRING DIAG: M54.50 (ICD-10-CM) - Low back pain, unspecified   Rationale for Evaluation and Treatment: Rehabilitation  THERAPY DIAG:  Other low back pain  Pain in right hip  Cramp and spasm  ONSET DATE: ~ 1 month   SUBJECTIVE:  SUBJECTIVE STATEMENT: Left side tight, had to go to ER on Saturday due to food poisoning, but other than that seems to be maintaining, pain under control.    PERTINENT HISTORY:  [redacted] weeks pregnant, h/o anemia, leukocytosis, LBP  PAIN:  Are you having pain? Yes: NPRS scale: low back & sacrum 4/10; glutes 4/10 Pain location:  low back, R hip/glutes  Pain description: dull, throbbing in low back & sacrum; intermittent low throbbing in glutes and thighs  Aggravating factors: 7-8/10 at worst, laying on  side Relieving factors: heating pad, massage, PT  PRECAUTIONS: Other: pregnant, no DN in lumbar/sacral region  RED FLAGS: None   WEIGHT BEARING RESTRICTIONS: No  FALLS:  Has patient fallen in last 6 months? No  LIVING ENVIRONMENT: Lives with: lives with their spouse Lives in: House/apartment Stairs: Yes: Internal: 14 steps; on right going up and on left going up and External: 1 steps; none Has following equipment at home: None  OCCUPATION: full time at [employer], but works at home most of the time.   PLOF: Independent  PATIENT GOALS: "lesses the pain, strengthen the muscles, stretch things out so not as painful"  NEXT MD VISIT: weekly   OBJECTIVE:   DIAGNOSTIC FINDINGS:  No relevant imaging  PATIENT SURVEYS:  Modified Oswestry 21/50   COGNITION: Overall cognitive status: Within functional limits for tasks assessed     SENSATION: WFL  MUSCLE LENGTH: Hamstrings: Right 90 deg; Left 90 deg  POSTURE: No Significant postural limitations  PALPATION: Tenderness L4/L5, bil QL, glutes and piriformis, SIJ, greater trochanters, Right worse than left.   LUMBAR ROM:  NT  LOWER EXTREMITY ROM:   good hip mobility bilaterally for both hip internal and external rotation.    LOWER EXTREMITY MMT:    MMT Right eval Left eval  Hip flexion 4+ 4+  Hip extension    Hip abduction 5 (in SL) 5 (in SL)  Hip adduction 5 5  Knee flexion 5 5  Knee extension 5 5  Ankle dorsiflexion 5 5  Ankle plantarflexion     (Blank rows = not tested)  LUMBAR SPECIAL TESTS:  Straight leg raise test: Negative and FABER test: Negative  FUNCTIONAL TESTS:    GAIT: Distance walked: 63' Assistive device utilized: None Level of assistance: Complete Independence Comments: no significant deviation or device   TODAY'S TREATMENT:                                                                                                                              DATE:   10/30/23 Manual Therapy:  to decrease muscle spasm and pain and improve mobility STM/TPR to bil QL, lumbar paraspinals, bil glute med and max, piriformis, skilled palpation and monitoring during dry needling. Trigger Point Dry-Needling  Treatment instructions: Expect mild to moderate muscle soreness. S/S of pneumothorax if dry needled over a lung field, and to seek immediate medical attention should they occur. Patient verbalized understanding of these instructions  and education. Patient Consent Given: Yes Education handout provided: Yes Muscles treated: bil glute med/max and piriformis Electrical stimulation performed: No Parameters: N/A Treatment response/outcome: Twitch Response Elicited and Palpable Increase in Muscle Length  10/23/2023 Therapeutic Exercise: to improve strength and mobility.  Demo, verbal and tactile cues throughout for technique. Nustep L5 x 5 min  Manual Therapy: to decrease muscle spasm and pain and improve mobility STM/TPR to bil QL, lumbar paraspinals, bil glute med and max, piriformis, skilled palpation and monitoring during dry needling. Trigger Point Dry-Needling  Treatment instructions: Expect mild to moderate muscle soreness. S/S of pneumothorax if dry needled over a lung field, and to seek immediate medical attention should they occur. Patient verbalized understanding of these instructions and education. Patient Consent Given: Yes Education handout provided: Yes Muscles treated: bil glute med/max and piriformis Electrical stimulation performed: No Parameters: N/A Treatment response/outcome: Twitch Response Elicited and Palpable Increase in Muscle Length   10/16/2023 Therapeutic Exercise: to improve strength and mobility.  Demo, verbal and tactile cues throughout for technique. Nustep L5 x 4 min LE only  Manual Therapy: to decrease muscle spasm and pain and improve mobility STM/TPR to bil QL, bil glute med and max, piriformis, pin and stretch to piriformis, skilled palpation and  monitoring during dry needling. Trigger Point Dry-Needling  Treatment instructions: Expect mild to moderate muscle soreness. S/S of pneumothorax if dry needled over a lung field, and to seek immediate medical attention should they occur. Patient verbalized understanding of these instructions and education. Patient Consent Given: Yes Education handout provided: Yes Muscles treated: bil glute med/max and piriformis Electrical stimulation performed: No Parameters: N/A Treatment response/outcome: Twitch Response Elicited and Palpable Increase in Muscle Length   PATIENT EDUCATION:  Education details: continue HEP Person educated: Patient Education method: Explanation Education comprehension: verbalized understanding  HOME EXERCISE PROGRAM: Access Code: [email or web address] URL: [email or web address] Date: 08/28/2023 Prepared by: Harrie Foreman  Exercises - Supine Diaphragmatic Breathing  - 1 x daily - 7 x weekly - 1 sets - 10 reps - Seated Diaphragmatic Breathing  - 1 x daily - 7 x weekly - 1 sets - 10 reps - Supine Posterior Pelvic Tilt  - 1 x daily - 7 x weekly - 1 sets - 10 reps - Seated Pelvic Tilt  - 1 x daily - 7 x weekly - 1 sets - 10 reps - Posterior Pelvic Tilt with Adduction Using Pillow in Hooklying  - 1 x daily - 7 x weekly - 1 sets - 10 reps - Bent Knee Fallouts  - 1 x daily - 7 x weekly - 1 sets - 10 reps - Supine Posterior Pelvic Tilt with Knee Rocks  - 1 x daily - 7 x weekly - 1 sets - 10 reps   ASSESSMENT:  CLINICAL IMPRESSION: Bobbi is doing well now with 1x/week, able to maintain acceptable level of pain control with use of TENS on low back.  Continued with manual therapy and TrDN to glutes, with more emphasis on STM to bil QL today.    Chyrl continues to demonstrate potential for improvement and would benefit from continued skilled therapy to address impairments.    OBJECTIVE IMPAIRMENTS: decreased activity tolerance, increased fascial restrictions,  increased muscle spasms, and pain.   ACTIVITY LIMITATIONS: carrying, lifting, bending, sleeping, bed mobility, and locomotion level  PARTICIPATION LIMITATIONS: meal prep, cleaning, laundry, shopping, and community activity  PERSONAL FACTORS: Past/current experiences and 1-2 comorbidities: history of LBP, pregnancy, obesity   are also affecting patient's functional outcome.  REHAB POTENTIAL: Good  CLINICAL DECISION MAKING: Evolving/moderate complexity  EVALUATION COMPLEXITY: Moderate   GOALS: Goals reviewed with patient? Yes  SHORT TERM GOALS: Target date: 09/07/2023   Patient will be independent with initial HEP.  Baseline:  Given 08/28/23 Goal status: MET 09/04/23   LONG TERM GOALS: Target date: 11/16/2023   Patient will be independent with advanced/ongoing HEP to improve outcomes and carryover.  Baseline:   Goal status: IN PROGRESS  2.  Patient will report 50% improvement in low back pain/R hip pain to improve QOL.  Baseline:  Goal status: IN PROGRESS  3.  Patient will demonstrate full pain free lumbar ROM to perform ADLs.   Baseline:  Goal status: IN PROGRESS  4.  Patient will report at least 6 points improvement on modified Oswestry to demonstrate improved functional ability.  Baseline: 21/50 Goal status: IN PROGRESS   5. Patient will report 50% improvement in sleep quality  Baseline: increased pain at night preventing sleep Goal status: IN PROGRESS   PLAN:  PT FREQUENCY: 2x/week for 6-8 weeks decreasing to 1x per week when appropriate for 12 weeks total  PT DURATION: 12 weeks  PLANNED INTERVENTIONS: Therapeutic exercises, Therapeutic activity, Neuromuscular re-education, Balance training, Gait training, Patient/Family education, Self Care, Joint mobilization, Dry Needling, Electrical stimulation, Spinal mobilization, Cryotherapy, Moist heat, Manual therapy, and Re-evaluation.  PLAN FOR NEXT SESSION: core stabilization exercises, can lie on back for short  periods but try in semi-reclined, manual therapy, TrDN to glutes/TFL only, no needling in spine region.  Consider Ktape to abdomen also.     Jena Gauss, PT, DPT  10/30/2023, 9:45 AM

## 2023-11-02 ENCOUNTER — Encounter: Payer: 59 | Admitting: Physical Therapy

## 2023-11-06 ENCOUNTER — Encounter: Payer: Self-pay | Admitting: Physical Therapy

## 2023-11-06 ENCOUNTER — Ambulatory Visit: Payer: 59 | Admitting: Physical Therapy

## 2023-11-06 DIAGNOSIS — R252 Cramp and spasm: Secondary | ICD-10-CM

## 2023-11-06 DIAGNOSIS — M5459 Other low back pain: Secondary | ICD-10-CM | POA: Diagnosis not present

## 2023-11-06 DIAGNOSIS — M25551 Pain in right hip: Secondary | ICD-10-CM

## 2023-11-06 NOTE — Therapy (Signed)
OUTPATIENT PHYSICAL THERAPY TREATMENT   Patient Name: Debra [name] MRN: [id number] DOB:27-Mar-1981, 42 y.o., female Today's Date: 11/06/2023  END OF SESSION:  PT End of Session - 11/06/23 0929     Visit Number 15    Date for PT Re-Evaluation 11/16/23    Authorization Type UHC [id number]    PT Start Time 0846    PT Stop Time 0929    PT Time Calculation (min) 43 min    Activity Tolerance Patient tolerated treatment well    Behavior During Therapy [facility] for tasks assessed/performed              Past Medical History:  Diagnosis Date   Acne    Allergic rhinitis    Cold sore 11/02/2016   Depression    GERD (gastroesophageal reflux disease)    Hearing loss of both ears    per pt right > left,  does not wear hearing aids   IDA (iron deficiency anemia)    Incompetent cervix    Infertility, female    Mild obstructive sleep apnea    per pt had sleep study was very mild osa,  did not meet critiria for insurance to pay but pt paid out of pocket since it helps due to dust mite allergy   Prior pregnancy with incompetent cervix in second trimester, antepartum 04/01/2022   03-31-2022 pre-mature ruptured membrane [redacted]wk gestation, nonviable   Uterine polyp    Past Surgical History:  Procedure Laterality Date   CERCLAGE LAPAROSCOPIC ABDOMINAL N/A 07/27/2022   Procedure: CERCLAGE LAPAROSCOPIC CERVICO- ABDOMINAL AND LYSIS OF ADHESIONS;  Surgeon: Fermin Schwab, MD;  Location: [facility];  Service: Gynecology;  Laterality: N/A;   DILATION AND CURETTAGE OF UTERUS N/A 04/01/2022   Procedure: DILATATION AND CURETTAGE;  Surgeon: Shea Evans, MD;  Location: [facility];  Service: Gynecology;  Laterality: N/A;   HYSTEROSCOPY N/A 07/27/2022   Procedure: HYSTEROSCOPY WITH ENDOMETRIAL BIOPSY;  Surgeon: Fermin Schwab, MD;  Location[facility];  Service: Gynecology;  Laterality: N/A;   Patient Active Problem List   Diagnosis Date Noted   Sleep apnea-like  behavior 09/19/2023   Anemia of pregnancy 07/14/2023   Pregnancy 07/03/2023   Pharyngitis 09/18/2022   Well adult exam 06/08/2022   Preterm delivery 04/01/2022   Incompetent cervix in pregnancy, antepartum, second trimester 04/01/2022   Pregnancy resulting from in vitro fertilization in second trimester 03/31/2022   Premature rupture of membranes in second trimester 03/31/2022   Allergic conjunctivitis of both eyes 12/16/2020   Allergic conjunctivitis 09/29/2020   Insomnia 07/18/2018   Elevated ALT measurement 07/18/2018   Snoring 01/03/2017   Chronic low back pain without sciatica 01/03/2017   Vitamin D deficiency 11/02/2016   Morbid obesity with BMI of 40.0-44.9, adult (HCC) 11/02/2016   Dermatographia 11/02/2016   Cold sore 11/02/2016   Perennial allergic rhinitis 11/02/2016   Acne 11/02/2016   Depressive disorder 11/25/2014   Herpes simplex 12/02/2013   Esophageal reflux 12/02/2013    PCP: Clayborne Dana, NP   REFERRING PROVIDER: Olivia Mackie, MD   REFERRING DIAG: M54.50 (ICD-10-CM) - Low back pain, unspecified   Rationale for Evaluation and Treatment: Rehabilitation  THERAPY DIAG:  Other low back pain  Pain in right hip  Cramp and spasm  ONSET DATE: ~ 1 month   SUBJECTIVE:  SUBJECTIVE STATEMENT: Hips/glutes are doing well, my low back hurts more.    PERTINENT HISTORY:  [redacted] weeks pregnant, h/o anemia, leukocytosis, LBP  PAIN:  Are you having pain? Yes: NPRS scale: low back  4/10 Pain location:  low back, R hip/glutes  Pain description: dull, throbbing in low back & sacrum; intermittent low throbbing in glutes and thighs  Aggravating factors: 7-8/10 at worst, laying on side Relieving factors: heating pad, massage, PT  PRECAUTIONS: Other: pregnant, no DN in lumbar/sacral  region  RED FLAGS: None   WEIGHT BEARING RESTRICTIONS: No  FALLS:  Has patient fallen in last 6 months? No  LIVING ENVIRONMENT: Lives with: lives with their spouse Lives in: House/apartment Stairs: Yes: Internal: 14 steps; on right going up and on left going up and External: 1 steps; none Has following equipment at home: None  OCCUPATION: full time at [employer], but works at home most of the time.   PLOF: Independent  PATIENT GOALS: "lesses the pain, strengthen the muscles, stretch things out so not as painful"  NEXT MD VISIT: weekly   OBJECTIVE:   DIAGNOSTIC FINDINGS:  No relevant imaging  PATIENT SURVEYS:  Modified Oswestry 21/50   COGNITION: Overall cognitive status: Within functional limits for tasks assessed     SENSATION: WFL  MUSCLE LENGTH: Hamstrings: Right 90 deg; Left 90 deg  POSTURE: No Significant postural limitations  PALPATION: Tenderness L4/L5, bil QL, glutes and piriformis, SIJ, greater trochanters, Right worse than left.   LUMBAR ROM:  NT  LOWER EXTREMITY ROM:   good hip mobility bilaterally for both hip internal and external rotation.    LOWER EXTREMITY MMT:    MMT Right eval Left eval  Hip flexion 4+ 4+  Hip extension    Hip abduction 5 (in SL) 5 (in SL)  Hip adduction 5 5  Knee flexion 5 5  Knee extension 5 5  Ankle dorsiflexion 5 5  Ankle plantarflexion     (Blank rows = not tested)  LUMBAR SPECIAL TESTS:  Straight leg raise test: Negative and FABER test: Negative  FUNCTIONAL TESTS:    GAIT: Distance walked: 54' Assistive device utilized: None Level of assistance: Complete Independence Comments: no significant deviation or device   TODAY'S TREATMENT:                                                                                                                              DATE:  11/06/23  Therapeutic Exercise: to improve strength and mobility.  Demo, verbal and tactile cues throughout for technique. Nustep L5  x 5 min for warm-up and muscle perfusion Manual Therapy: to decrease muscle spasm and pain and improve mobility STM/TPR to bil QL, lumbar paraspinals, bil glute med and max, piriformis, skilled palpation and monitoring during dry needling. Trigger Point Dry-Needling  Treatment instructions: Expect mild to moderate muscle soreness. S/S of pneumothorax if dry needled over a lung field, and to seek immediate medical attention should they  occur. Patient verbalized understanding of these instructions and education. Patient Consent Given: Yes Education handout provided: Yes Muscles treated: bil glute med/max  Electrical stimulation performed: No Parameters: N/A Treatment response/outcome: Twitch Response Elicited and Palpable Increase in Muscle Length  10/30/23 Manual Therapy: to decrease muscle spasm and pain and improve mobility STM/TPR to bil QL, lumbar paraspinals, bil glute med and max, piriformis, skilled palpation and monitoring during dry needling. Trigger Point Dry-Needling  Treatment instructions: Expect mild to moderate muscle soreness. S/S of pneumothorax if dry needled over a lung field, and to seek immediate medical attention should they occur. Patient verbalized understanding of these instructions and education. Patient Consent Given: Yes Education handout provided: Yes Muscles treated: bil glute med/max and piriformis Electrical stimulation performed: No Parameters: N/A Treatment response/outcome: Twitch Response Elicited and Palpable Increase in Muscle Length  10/23/2023 Therapeutic Exercise: to improve strength and mobility.  Demo, verbal and tactile cues throughout for technique. Nustep L5 x 5 min  Manual Therapy: to decrease muscle spasm and pain and improve mobility STM/TPR to bil QL, lumbar paraspinals, bil glute med and max, piriformis, skilled palpation and monitoring during dry needling. Trigger Point Dry-Needling  Treatment instructions: Expect mild to moderate muscle  soreness. S/S of pneumothorax if dry needled over a lung field, and to seek immediate medical attention should they occur. Patient verbalized understanding of these instructions and education. Patient Consent Given: Yes Education handout provided: Yes Muscles treated: bil glute med/max and piriformis Electrical stimulation performed: No Parameters: N/A Treatment response/outcome: Twitch Response Elicited and Palpable Increase in Muscle Length   PATIENT EDUCATION:  Education details: continue HEP Person educated: Patient Education method: Explanation Education comprehension: verbalized understanding  HOME EXERCISE PROGRAM: Access Code: [email or web address] URL: [email or web address] Date: 08/28/2023 Prepared by: Harrie Foreman  Exercises - Supine Diaphragmatic Breathing  - 1 x daily - 7 x weekly - 1 sets - 10 reps - Seated Diaphragmatic Breathing  - 1 x daily - 7 x weekly - 1 sets - 10 reps - Supine Posterior Pelvic Tilt  - 1 x daily - 7 x weekly - 1 sets - 10 reps - Seated Pelvic Tilt  - 1 x daily - 7 x weekly - 1 sets - 10 reps - Posterior Pelvic Tilt with Adduction Using Pillow in Hooklying  - 1 x daily - 7 x weekly - 1 sets - 10 reps - Bent Knee Fallouts  - 1 x daily - 7 x weekly - 1 sets - 10 reps - Supine Posterior Pelvic Tilt with Knee Rocks  - 1 x daily - 7 x weekly - 1 sets - 10 reps   ASSESSMENT:  CLINICAL IMPRESSION: Padee is doing well, reporting more LBP now, discussed using belt as growing abdomen placing her In more of a lordotic posture,  Focused more on low back and QL today, as glutes and hips are feeling better.    Mikaylin continues to demonstrate potential for improvement and would benefit from continued skilled therapy to address impairments.    OBJECTIVE IMPAIRMENTS: decreased activity tolerance, increased fascial restrictions, increased muscle spasms, and pain.   ACTIVITY LIMITATIONS: carrying, lifting, bending, sleeping, bed mobility, and locomotion  level  PARTICIPATION LIMITATIONS: meal prep, cleaning, laundry, shopping, and community activity  PERSONAL FACTORS: Past/current experiences and 1-2 comorbidities: history of LBP, pregnancy, obesity   are also affecting patient's functional outcome.   REHAB POTENTIAL: Good  CLINICAL DECISION MAKING: Evolving/moderate complexity  EVALUATION COMPLEXITY: Moderate   GOALS: Goals reviewed with patient? Yes  SHORT  TERM GOALS: Target date: 09/07/2023   Patient will be independent with initial HEP.  Baseline:  Given 08/28/23 Goal status: MET 09/04/23   LONG TERM GOALS: Target date: 11/16/2023   Patient will be independent with advanced/ongoing HEP to improve outcomes and carryover.  Baseline:   Goal status: IN PROGRESS  2.  Patient will report 50% improvement in low back pain/R hip pain to improve QOL.  Baseline:  Goal status: IN PROGRESS  3.  Patient will demonstrate full pain free lumbar ROM to perform ADLs.   Baseline:  Goal status: IN PROGRESS  4.  Patient will report at least 6 points improvement on modified Oswestry to demonstrate improved functional ability.  Baseline: 21/50 Goal status: IN PROGRESS   5. Patient will report 50% improvement in sleep quality  Baseline: increased pain at night preventing sleep Goal status: IN PROGRESS   PLAN:  PT FREQUENCY: 2x/week for 6-8 weeks decreasing to 1x per week when appropriate for 12 weeks total  PT DURATION: 12 weeks  PLANNED INTERVENTIONS: Therapeutic exercises, Therapeutic activity, Neuromuscular re-education, Balance training, Gait training, Patient/Family education, Self Care, Joint mobilization, Dry Needling, Electrical stimulation, Spinal mobilization, Cryotherapy, Moist heat, Manual therapy, and Re-evaluation.  PLAN FOR NEXT SESSION: core stabilization exercises, can lie on back for short periods but try in semi-reclined, manual therapy, TrDN to glutes/TFL only, no needling in spine region.  Consider Ktape to abdomen  also.     Jena Gauss, PT, DPT  11/06/2023, 9:33 AM

## 2023-11-09 ENCOUNTER — Encounter: Payer: 59 | Admitting: Physical Therapy

## 2023-11-13 ENCOUNTER — Encounter: Payer: Self-pay | Admitting: Physical Therapy

## 2023-11-13 ENCOUNTER — Ambulatory Visit: Payer: 59 | Admitting: Physical Therapy

## 2023-11-13 DIAGNOSIS — M5459 Other low back pain: Secondary | ICD-10-CM

## 2023-11-13 DIAGNOSIS — M25551 Pain in right hip: Secondary | ICD-10-CM

## 2023-11-13 DIAGNOSIS — R252 Cramp and spasm: Secondary | ICD-10-CM

## 2023-11-13 NOTE — Therapy (Signed)
OUTPATIENT PHYSICAL THERAPY TREATMENT   Patient Name: Debra [name] MRN: [id number] DOB:12-03-1981, 42 y.o., female Today's Date: 11/13/2023  END OF SESSION:  PT End of Session - 11/13/23 0849     Visit Number 16    Date for PT Re-Evaluation 12/04/23    Authorization Type UHC [id number]    PT Start Time 0846    PT Stop Time 0930    PT Time Calculation (min) 44 min    Activity Tolerance Patient tolerated treatment well    Behavior During Therapy [facility] for tasks assessed/performed              Past Medical History:  Diagnosis Date   Acne    Allergic rhinitis    Cold sore 11/02/2016   Depression    GERD (gastroesophageal reflux disease)    Hearing loss of both ears    per pt right > left,  does not wear hearing aids   IDA (iron deficiency anemia)    Incompetent cervix    Infertility, female    Mild obstructive sleep apnea    per pt had sleep study was very mild osa,  did not meet critiria for insurance to pay but pt paid out of pocket since it helps due to dust mite allergy   Prior pregnancy with incompetent cervix in second trimester, antepartum 04/01/2022   03-31-2022 pre-mature ruptured membrane [redacted]wk gestation, nonviable   Uterine polyp    Past Surgical History:  Procedure Laterality Date   CERCLAGE LAPAROSCOPIC ABDOMINAL N/A 07/27/2022   Procedure: CERCLAGE LAPAROSCOPIC CERVICO- ABDOMINAL AND LYSIS OF ADHESIONS;  Surgeon: Fermin Schwab, MD;  Location: [facility];  Service: Gynecology;  Laterality: N/A;   DILATION AND CURETTAGE OF UTERUS N/A 04/01/2022   Procedure: DILATATION AND CURETTAGE;  Surgeon: Shea Evans, MD;  Location: [facility];  Service: Gynecology;  Laterality: N/A;   HYSTEROSCOPY N/A 07/27/2022   Procedure: HYSTEROSCOPY WITH ENDOMETRIAL BIOPSY;  Surgeon: Fermin Schwab, MD;  Location: [facility];  Service: Gynecology;  Laterality: N/A;   Patient Active Problem List   Diagnosis Date Noted   Sleep apnea-like  behavior 09/19/2023   Anemia of pregnancy 07/14/2023   Pregnancy 07/03/2023   Pharyngitis 09/18/2022   Well adult exam 06/08/2022   Preterm delivery 04/01/2022   Incompetent cervix in pregnancy, antepartum, second trimester 04/01/2022   Pregnancy resulting from in vitro fertilization in second trimester 03/31/2022   Premature rupture of membranes in second trimester 03/31/2022   Allergic conjunctivitis of both eyes 12/16/2020   Allergic conjunctivitis 09/29/2020   Insomnia 07/18/2018   Elevated ALT measurement 07/18/2018   Snoring 01/03/2017   Chronic low back pain without sciatica 01/03/2017   Vitamin D deficiency 11/02/2016   Morbid obesity with BMI of 40.0-44.9, adult (HCC) 11/02/2016   Dermatographia 11/02/2016   Cold sore 11/02/2016   Perennial allergic rhinitis 11/02/2016   Acne 11/02/2016   Depressive disorder 11/25/2014   Herpes simplex 12/02/2013   Esophageal reflux 12/02/2013    PCP: Clayborne Dana, NP   REFERRING PROVIDER: Olivia Mackie, MD   REFERRING DIAG: M54.50 (ICD-10-CM) - Low back pain, unspecified   Rationale for Evaluation and Treatment: Rehabilitation  THERAPY DIAG:  Other low back pain  Pain in right hip  Cramp and spasm  ONSET DATE: ~ 1 month   SUBJECTIVE:  SUBJECTIVE STATEMENT: Hips/glutes are doing well, especially L side now, only 3/5, R 4.5/5.  Overall feeling good, just tired.    PERTINENT HISTORY:  [redacted] weeks pregnant, h/o anemia, leukocytosis, LBP  PAIN:  Are you having pain? Yes: NPRS scale: low back  4/10 Pain location:  low back, R hip/glutes  Pain description: dull, throbbing in low back & sacrum; intermittent low throbbing in glutes and thighs  Aggravating factors: 7-8/10 at worst, laying on side Relieving factors: heating pad, massage,  PT  PRECAUTIONS: Other: pregnant, no DN in lumbar/sacral region  RED FLAGS: None   WEIGHT BEARING RESTRICTIONS: No  FALLS:  Has patient fallen in last 6 months? No  LIVING ENVIRONMENT: Lives with: lives with their spouse Lives in: House/apartment Stairs: Yes: Internal: 14 steps; on right going up and on left going up and External: 1 steps; none Has following equipment at home: None  OCCUPATION: full time at [employer], but works at home most of the time.   PLOF: Independent  PATIENT GOALS: "lesses the pain, strengthen the muscles, stretch things out so not as painful"  NEXT MD VISIT: weekly   OBJECTIVE:   DIAGNOSTIC FINDINGS:  No relevant imaging  PATIENT SURVEYS:  Modified Oswestry 21/50   COGNITION: Overall cognitive status: Within functional limits for tasks assessed     SENSATION: WFL  MUSCLE LENGTH: Hamstrings: Right 90 deg; Left 90 deg  POSTURE: No Significant postural limitations  PALPATION: Tenderness L4/L5, bil QL, glutes and piriformis, SIJ, greater trochanters, Right worse than left.   LUMBAR ROM:  NT  LOWER EXTREMITY ROM:   good hip mobility bilaterally for both hip internal and external rotation.    LOWER EXTREMITY MMT:    MMT Right eval Left eval  Hip flexion 4+ 4+  Hip extension    Hip abduction 5 (in SL) 5 (in SL)  Hip adduction 5 5  Knee flexion 5 5  Knee extension 5 5  Ankle dorsiflexion 5 5  Ankle plantarflexion     (Blank rows = not tested)  LUMBAR SPECIAL TESTS:  Straight leg raise test: Negative and FABER test: Negative  FUNCTIONAL TESTS:    GAIT: Distance walked: 54' Assistive device utilized: None Level of assistance: Complete Independence Comments: no significant deviation or device   TODAY'S TREATMENT:                                                                                                                              DATE:   11/13/2023 Therapeutic Exercise: to improve strength and mobility.  Demo,  verbal and tactile cues throughout for technique. Nustep L5 x 5 min for warm-up and muscle perfusion Manual Therapy: to decrease muscle spasm and pain and improve mobility STM/TPR to bil QL, lumbar paraspinals, bil glute med and max, piriformis, skilled palpation and monitoring during dry needling. Trigger Point Dry-Needling  Treatment instructions: Expect mild to moderate muscle soreness. S/S of pneumothorax if dry needled over a lung  field, and to seek immediate medical attention should they occur. Patient verbalized understanding of these instructions and education. Patient Consent Given: Yes Education handout provided: Yes Muscles treated: bil glute med/max  Electrical stimulation performed: No Parameters: N/A Treatment response/outcome: Twitch Response Elicited and Palpable Increase in Muscle Length  11/06/23  Therapeutic Exercise: to improve strength and mobility.  Demo, verbal and tactile cues throughout for technique. Nustep L5 x 5 min for warm-up and muscle perfusion Manual Therapy: to decrease muscle spasm and pain and improve mobility STM/TPR to bil QL, lumbar paraspinals, bil glute med and max, piriformis, skilled palpation and monitoring during dry needling. Trigger Point Dry-Needling  Treatment instructions: Expect mild to moderate muscle soreness. S/S of pneumothorax if dry needled over a lung field, and to seek immediate medical attention should they occur. Patient verbalized understanding of these instructions and education. Patient Consent Given: Yes Education handout provided: Yes Muscles treated: bil glute med/max  Electrical stimulation performed: No Parameters: N/A Treatment response/outcome: Twitch Response Elicited and Palpable Increase in Muscle Length   PATIENT EDUCATION:  Education details: continue HEP Person educated: Patient Education method: Explanation Education comprehension: verbalized understanding  HOME EXERCISE PROGRAM: Access Code:  [email or web address] URL: [email or web address] Date: 08/28/2023 Prepared by: Harrie Foreman  Exercises - Supine Diaphragmatic Breathing  - 1 x daily - 7 x weekly - 1 sets - 10 reps - Seated Diaphragmatic Breathing  - 1 x daily - 7 x weekly - 1 sets - 10 reps - Supine Posterior Pelvic Tilt  - 1 x daily - 7 x weekly - 1 sets - 10 reps - Seated Pelvic Tilt  - 1 x daily - 7 x weekly - 1 sets - 10 reps - Posterior Pelvic Tilt with Adduction Using Pillow in Hooklying  - 1 x daily - 7 x weekly - 1 sets - 10 reps - Bent Knee Fallouts  - 1 x daily - 7 x weekly - 1 sets - 10 reps - Supine Posterior Pelvic Tilt with Knee Rocks  - 1 x daily - 7 x weekly - 1 sets - 10 reps   ASSESSMENT:  CLINICAL IMPRESSION: Debra [name] is doing well, feels current POC is keeping pain managed.  Continued to focus on more manual therapy to low back and QL after brief TrDN to glutes.  As she only has a short time before delivery (planned C-section, will continue 1x/week for up to 4 more weeks to continue to manage pain for  QOL.   Debra [name] continues to demonstrate potential for improvement and would benefit from continued skilled therapy to address impairments.    OBJECTIVE IMPAIRMENTS: decreased activity tolerance, increased fascial restrictions, increased muscle spasms, and pain.   ACTIVITY LIMITATIONS: carrying, lifting, bending, sleeping, bed mobility, and locomotion level  PARTICIPATION LIMITATIONS: meal prep, cleaning, laundry, shopping, and community activity  PERSONAL FACTORS: Past/current experiences and 1-2 comorbidities: history of LBP, pregnancy, obesity   are also affecting patient's functional outcome.   REHAB POTENTIAL: Good  CLINICAL DECISION MAKING: Evolving/moderate complexity  EVALUATION COMPLEXITY: Moderate   GOALS: Goals reviewed with patient? Yes  SHORT TERM GOALS: Target date: 09/07/2023   Patient will be independent with initial HEP.  Baseline:  Given 08/28/23 Goal status: MET  09/04/23   LONG TERM GOALS: Target date: 11/16/2023  extended to 12/08/23 Patient will be independent with advanced/ongoing HEP to improve outcomes and carryover.  Baseline:   Goal status: MET for current   2.  Patient will report 50% improvement in low back pain/R hip pain  to improve QOL.  Baseline:  Goal status: MET  3.  Patient will demonstrate full pain free lumbar ROM to perform ADLs.   Baseline:  Goal status: IN PROGRESS  4.  Patient will report at least 6 points improvement on modified Oswestry to demonstrate improved functional ability.  Baseline: 21/50 Goal status: IN PROGRESS   5. Patient will report 50% improvement in sleep quality  Baseline: increased pain at night preventing sleep Goal status: IN PROGRESS   PLAN:  PT FREQUENCY: 1x/week for 4 weeks.   PT DURATION: 12 weeks  PLANNED INTERVENTIONS: Therapeutic exercises, Therapeutic activity, Neuromuscular re-education, Balance training, Gait training, Patient/Family education, Self Care, Joint mobilization, Dry Needling, Electrical stimulation, Spinal mobilization, Cryotherapy, Moist heat, Manual therapy, and Re-evaluation.  PLAN FOR NEXT SESSION: core stabilization exercises, can lie on back for short periods but try in semi-reclined, manual therapy, TrDN to glutes/TFL only, no needling in spine region.  Consider Ktape to abdomen also.     Jena Gauss, PT, DPT  11/13/2023, 9:50 AM

## 2023-11-15 ENCOUNTER — Encounter: Payer: 59 | Admitting: Physical Therapy

## 2023-11-20 ENCOUNTER — Encounter: Payer: Self-pay | Admitting: Physical Therapy

## 2023-11-20 ENCOUNTER — Encounter: Payer: Self-pay | Admitting: Family Medicine

## 2023-11-20 ENCOUNTER — Ambulatory Visit: Payer: 59 | Attending: Obstetrics and Gynecology | Admitting: Physical Therapy

## 2023-11-20 DIAGNOSIS — M25551 Pain in right hip: Secondary | ICD-10-CM | POA: Diagnosis present

## 2023-11-20 DIAGNOSIS — R252 Cramp and spasm: Secondary | ICD-10-CM | POA: Diagnosis present

## 2023-11-20 DIAGNOSIS — M5459 Other low back pain: Secondary | ICD-10-CM | POA: Diagnosis present

## 2023-11-20 NOTE — Therapy (Signed)
OUTPATIENT PHYSICAL THERAPY TREATMENT   Patient Name: Debra [name] MRN: [id number] DOB:07/16/1981, 42 y.o., female Today's Date: 11/20/2023  END OF SESSION:  PT End of Session - 11/20/23 0935     Visit Number 17    Date for PT Re-Evaluation 12/08/23    Authorization Type U[employer] [id number]    PT Start Time 0929    PT Stop Time 1014    PT Time Calculation (min) 45 min    Activity Tolerance Patient tolerated treatment well    Behavior During Therapy [facility] for tasks assessed/performed              Past Medical History:  Diagnosis Date   Acne    Allergic rhinitis    Cold sore 11/02/2016   Depression    GERD (gastroesophageal reflux disease)    Hearing loss of both ears    per pt right > left,  does not wear hearing aids   IDA (iron deficiency anemia)    Incompetent cervix    Infertility, female    Mild obstructive sleep apnea    per pt had sleep study was very mild osa,  did not meet critiria for insurance to pay but pt paid out of pocket since it helps due to dust mite allergy   Prior pregnancy with incompetent cervix in second trimester, antepartum 04/01/2022   03-31-2022 pre-mature ruptured membrane [redacted]wk gestation, nonviable   Uterine polyp    Past Surgical History:  Procedure Laterality Date   CERCLAGE LAPAROSCOPIC ABDOMINAL N/A 07/27/2022   Procedure: CERCLAGE LAPAROSCOPIC CERVICO- ABDOMINAL AND LYSIS OF ADHESIONS;  Surgeon: Fermin Schwab, MD;  Location: [facility];  Service: Gynecology;  Laterality: N/A;   DILATION AND CURETTAGE OF UTERUS N/A 04/01/2022   Procedure: DILATATION AND CURETTAGE;  Surgeon: Shea Evans, MD;  Location: [facility];  Service: Gynecology;  Laterality: N/A;   HYSTEROSCOPY N/A 07/27/2022   Procedure: HYSTEROSCOPY WITH ENDOMETRIAL BIOPSY;  Surgeon: Fermin Schwab, MD;  Location: [facility];  Service: Gynecology;  Laterality: N/A;   Patient Active Problem List   Diagnosis Date Noted   Sleep apnea-like  behavior 09/19/2023   Anemia of pregnancy 07/14/2023   Pregnancy 07/03/2023   Pharyngitis 09/18/2022   Well adult exam 06/08/2022   Preterm delivery 04/01/2022   Incompetent cervix in pregnancy, antepartum, second trimester 04/01/2022   Pregnancy resulting from in vitro fertilization in second trimester 03/31/2022   Premature rupture of membranes in second trimester 03/31/2022   Allergic conjunctivitis of both eyes 12/16/2020   Allergic conjunctivitis 09/29/2020   Insomnia 07/18/2018   Elevated ALT measurement 07/18/2018   Snoring 01/03/2017   Chronic low back pain without sciatica 01/03/2017   Vitamin D deficiency 11/02/2016   Morbid obesity with BMI of 40.0-44.9, adult (HCC) 11/02/2016   Dermatographia 11/02/2016   Cold sore 11/02/2016   Perennial allergic rhinitis 11/02/2016   Acne 11/02/2016   Depressive disorder 11/25/2014   Herpes simplex 12/02/2013   Esophageal reflux 12/02/2013    PCP: Clayborne Dana, NP   REFERRING PROVIDER: Olivia Mackie, MD   REFERRING DIAG: M54.50 (ICD-10-CM) - Low back pain, unspecified   Rationale for Evaluation and Treatment: Rehabilitation  THERAPY DIAG:  Other low back pain  Pain in right hip  Cramp and spasm  ONSET DATE: ~ 1 month   SUBJECTIVE:  SUBJECTIVE STATEMENT: Doing well, pain more in low back now than hips.     PERTINENT HISTORY:  [redacted] weeks pregnant, h/o anemia, leukocytosis, LBP  PAIN:  Are you having pain? Yes: NPRS scale: low back  4/10 Pain location:  low back, R hip/glutes  Pain description: dull, throbbing in low back & sacrum; intermittent low throbbing in glutes and thighs  Aggravating factors: 7-8/10 at worst, laying on side Relieving factors: heating pad, massage, PT  PRECAUTIONS: Other: pregnant, no DN in lumbar/sacral  region  RED FLAGS: None   WEIGHT BEARING RESTRICTIONS: No  FALLS:  Has patient fallen in last 6 months? No  LIVING ENVIRONMENT: Lives with: lives with their spouse Lives in: House/apartment Stairs: Yes: Internal: 14 steps; on right going up and on left going up and External: 1 steps; none Has following equipment at home: None  OCCUPATION: full time at [employer], but works at home most of the time.   PLOF: Independent  PATIENT GOALS: "lesses the pain, strengthen the muscles, stretch things out so not as painful"  NEXT MD VISIT: weekly   OBJECTIVE:   DIAGNOSTIC FINDINGS:  No relevant imaging  PATIENT SURVEYS:  Modified Oswestry 21/50   COGNITION: Overall cognitive status: Within functional limits for tasks assessed     SENSATION: WFL  MUSCLE LENGTH: Hamstrings: Right 90 deg; Left 90 deg  POSTURE: No Significant postural limitations  PALPATION: Tenderness L4/L5, bil QL, glutes and piriformis, SIJ, greater trochanters, Right worse than left.   LUMBAR ROM:  NT  LOWER EXTREMITY ROM:   good hip mobility bilaterally for both hip internal and external rotation.    LOWER EXTREMITY MMT:    MMT Right eval Left eval  Hip flexion 4+ 4+  Hip extension    Hip abduction 5 (in SL) 5 (in SL)  Hip adduction 5 5  Knee flexion 5 5  Knee extension 5 5  Ankle dorsiflexion 5 5  Ankle plantarflexion     (Blank rows = not tested)  LUMBAR SPECIAL TESTS:  Straight leg raise test: Negative and FABER test: Negative  FUNCTIONAL TESTS:    GAIT: Distance walked: 41' Assistive device utilized: None Level of assistance: Complete Independence Comments: no significant deviation or device   TODAY'S TREATMENT:                                                                                                                              DATE:   11/20/2023 Therapeutic Exercise: to improve strength and mobility.  Demo, verbal and tactile cues throughout for technique. Nustep  L5 x 5 min for warm-up and muscle perfusion Manual Therapy: to decrease muscle spasm and pain and improve mobility STM/TPR to bil QL, lumbar paraspinals, bil glute med and max, piriformis, skilled palpation and monitoring during dry needling. Trigger Point Dry-Needling  Treatment instructions: Expect mild to moderate muscle soreness. S/S of pneumothorax if dry needled over a lung field, and to seek immediate medical attention  should they occur. Patient verbalized understanding of these instructions and education. Patient Consent Given: Yes Education handout provided: Yes Muscles treated: bil glute med/max  Electrical stimulation performed: No Parameters: N/A Treatment response/outcome: Twitch Response Elicited and Palpable Increase in Muscle Length  11/13/2023 Therapeutic Exercise: to improve strength and mobility.  Demo, verbal and tactile cues throughout for technique. Nustep L5 x 5 min for warm-up and muscle perfusion Manual Therapy: to decrease muscle spasm and pain and improve mobility STM/TPR to bil QL, lumbar paraspinals, bil glute med and max, piriformis, skilled palpation and monitoring during dry needling. Trigger Point Dry-Needling  Treatment instructions: Expect mild to moderate muscle soreness. S/S of pneumothorax if dry needled over a lung field, and to seek immediate medical attention should they occur. Patient verbalized understanding of these instructions and education. Patient Consent Given: Yes Education handout provided: Yes Muscles treated: bil glute med/max  Electrical stimulation performed: No Parameters: N/A Treatment response/outcome: Twitch Response Elicited and Palpable Increase in Muscle Length  11/06/23  Therapeutic Exercise: to improve strength and mobility.  Demo, verbal and tactile cues throughout for technique. Nustep L5 x 5 min for warm-up and muscle perfusion Manual Therapy: to decrease muscle spasm and pain and improve mobility STM/TPR to bil QL,  lumbar paraspinals, bil glute med and max, piriformis, skilled palpation and monitoring during dry needling. Trigger Point Dry-Needling  Treatment instructions: Expect mild to moderate muscle soreness. S/S of pneumothorax if dry needled over a lung field, and to seek immediate medical attention should they occur. Patient verbalized understanding of these instructions and education. Patient Consent Given: Yes Education handout provided: Yes Muscles treated: bil glute med/max  Electrical stimulation performed: No Parameters: N/A Treatment response/outcome: Twitch Response Elicited and Palpable Increase in Muscle Length   PATIENT EDUCATION:  Education details: continue HEP Person educated: Patient Education method: Explanation Education comprehension: verbalized understanding  HOME EXERCISE PROGRAM: Access Code: [email or web address] URL: [email or web address] Date: 08/28/2023 Prepared by: Harrie Foreman  Exercises - Supine Diaphragmatic Breathing  - 1 x daily - 7 x weekly - 1 sets - 10 reps - Seated Diaphragmatic Breathing  - 1 x daily - 7 x weekly - 1 sets - 10 reps - Supine Posterior Pelvic Tilt  - 1 x daily - 7 x weekly - 1 sets - 10 reps - Seated Pelvic Tilt  - 1 x daily - 7 x weekly - 1 sets - 10 reps - Posterior Pelvic Tilt with Adduction Using Pillow in Hooklying  - 1 x daily - 7 x weekly - 1 sets - 10 reps - Bent Knee Fallouts  - 1 x daily - 7 x weekly - 1 sets - 10 reps - Supine Posterior Pelvic Tilt with Knee Rocks  - 1 x daily - 7 x weekly - 1 sets - 10 reps   ASSESSMENT:  CLINICAL IMPRESSION: Windell is doing well, feels current POC is keeping pain managed.  Continued to focus on more manual therapy to low back and QL after brief TrDN to glutes.  Noted more tissue banding in lumbar erector spinae and QL today.  Kadi continues to demonstrate potential for improvement and would benefit from continued skilled therapy to address impairments.    OBJECTIVE IMPAIRMENTS:  decreased activity tolerance, increased fascial restrictions, increased muscle spasms, and pain.   ACTIVITY LIMITATIONS: carrying, lifting, bending, sleeping, bed mobility, and locomotion level  PARTICIPATION LIMITATIONS: meal prep, cleaning, laundry, shopping, and community activity  PERSONAL FACTORS: Past/current experiences and 1-2 comorbidities: history of LBP, pregnancy, obesity  are also affecting patient's functional outcome.   REHAB POTENTIAL: Good  CLINICAL DECISION MAKING: Evolving/moderate complexity  EVALUATION COMPLEXITY: Moderate   GOALS: Goals reviewed with patient? Yes  SHORT TERM GOALS: Target date: 09/07/2023   Patient will be independent with initial HEP.  Baseline:  Given 08/28/23 Goal status: MET 09/04/23   LONG TERM GOALS: Target date: 11/16/2023  extended to 12/08/23 Patient will be independent with advanced/ongoing HEP to improve outcomes and carryover.  Baseline:   Goal status: MET for current   2.  Patient will report 50% improvement in low back pain/R hip pain to improve QOL.  Baseline:  Goal status: MET  3.  Patient will demonstrate full pain free lumbar ROM to perform ADLs.   Baseline:  Goal status: IN PROGRESS  4.  Patient will report at least 6 points improvement on modified Oswestry to demonstrate improved functional ability.  Baseline: 21/50 Goal status: IN PROGRESS   5. Patient will report 50% improvement in sleep quality  Baseline: increased pain at night preventing sleep Goal status: IN PROGRESS   PLAN:  PT FREQUENCY: 1x/week for 4 weeks.   PT DURATION: 12 weeks  PLANNED INTERVENTIONS: Therapeutic exercises, Therapeutic activity, Neuromuscular re-education, Balance training, Gait training, Patient/Family education, Self Care, Joint mobilization, Dry Needling, Electrical stimulation, Spinal mobilization, Cryotherapy, Moist heat, Manual therapy, and Re-evaluation.  PLAN FOR NEXT SESSION: core stabilization exercises, can lie on  back for short periods but try in semi-reclined, manual therapy, TrDN to glutes/TFL only, no needling in spine region.  Consider Ktape to abdomen also.     Jena Gauss, PT, DPT  11/20/2023, 10:25 AM

## 2023-11-20 NOTE — Telephone Encounter (Signed)
Patient just established care in October. We don't have this medication on her list. Please advise.

## 2023-11-20 NOTE — Telephone Encounter (Signed)
Yeah, since not on her list or in my last note can we do a visit to find out symptoms, dose, etc. Virtual is fine.

## 2023-11-22 NOTE — Patient Instructions (Addendum)
Debra [name]  11/22/2023   Your procedure is scheduled on:  12/04/2023  Arrive at 0530 at Entrance C on [address] at [facility]  and [address]. You are invited to use the FREE valet parking or use the Visitor's parking deck.  Pick up the phone at the desk and dial [phone].  Call this number if you have problems the morning of surgery: [phone]  Remember:   Do not eat food:(After Midnight) Desps de medianoche.  You may drink clear liquids until arrival at __0530___.  Clear liquids means a liquid you can see thru.  It can have color such as Cola or Kool aid.  Tea is OK and coffee as long as no milk or creamer of any kind.  Take these medicines the morning of surgery with A SIP OF WATER:  Take valtrex as prescribed and nifedipine as prescribed   Do not wear jewelry, make-up or nail polish.  Do not wear lotions, powders, or perfumes. Do not wear deodorant.  Do not shave 48 hours prior to surgery.  Do not bring valuables to the hospital.  [facility] is not   responsible for any belongings or valuables brought to the hospital.  Contacts, dentures or bridgework may not be worn into surgery.  Leave suitcase in the car. After surgery it may be brought to your room.  For patients admitted to the hospital, checkout time is 11:00 AM the day of              discharge.      Please read over the following fact sheets that you were given:     Preparing for Surgery

## 2023-11-23 ENCOUNTER — Telehealth: Payer: 59 | Admitting: Family Medicine

## 2023-11-23 ENCOUNTER — Encounter: Payer: Self-pay | Admitting: Family Medicine

## 2023-11-23 VITALS — BP 140/96 | Ht 65.0 in | Wt 309.0 lb

## 2023-11-23 DIAGNOSIS — B009 Herpesviral infection, unspecified: Secondary | ICD-10-CM | POA: Diagnosis not present

## 2023-11-23 MED ORDER — VALACYCLOVIR HCL 500 MG PO TABS: 500.0000 mg | ORAL_TABLET | Freq: Two times a day (BID) | ORAL | 4 refills | Status: DC

## 2023-11-23 NOTE — Assessment & Plan Note (Signed)
Patient with a history of cold sores, currently pregnant and requesting suppressive therapy to prevent neonatal transmission. No recent flares reported. -Change Valtrex dose to 500mg  twice daily for suppression

## 2023-11-23 NOTE — Progress Notes (Signed)
Virtual Video Visit via MyChart Note  I connected with  Debra [name] on 11/23/23 at 11:00 AM EST by the video enabled telemedicine application for MyChart, and verified that I am speaking with the correct person using two identifiers.   I introduced myself as a Publishing rights manager with the practice. We discussed the limitations of evaluation and management by telemedicine and the availability of in person appointments. The patient expressed understanding and agreed to proceed.  Participating parties in this visit include: The patient and the nurse practitioner listed.  The patient is: At home I am: In the office - [facility]  Subjective:    CC:  Chief Complaint  Patient presents with   Medical Management of Chronic Issues    HPI: Debra [name] is a 42 y.o. year old female presenting today via MyChart today for cold sores.   Discussed the use of AI scribe software for clinical note transcription with the patient, who gave verbal consent to proceed.  History of Present Illness   The patient, with a history of recurrent cold sores, requested a refill of her Valtrex (Valacyclovir) prescription. She had previously been prescribed this medication by her OB-GYN and other healthcare providers. The patient had been proactively taking the medication for the past month in anticipation of her upcoming C-section. She reported only two instances of cold sores this year, typically taking the medication when she felt the onset of symptoms. The patient was previously on a regimen of one gram of Valtrex per day PRN. She expressed a desire to continue the medication for the first three to four months postpartum to prevent potential transmission of the herpes virus to the newborn.            Past medical history, Surgical history, Family history not pertinant except as noted below, Social history, Allergies, and medications have been entered into  the medical record, reviewed, and corrections made.   Review of Systems:  All review of systems negative except what is listed in the HPI   Objective:    General:  Speaking clearly in complete sentences. Absent shortness of breath noted.   Alert and oriented x3.   Normal judgment.  Absent acute distress.   Impression and Recommendations:    Problem List Items Addressed This Visit       Active Problems   Herpes simplex - Primary    Patient with a history of cold sores, currently pregnant and requesting suppressive therapy to prevent neonatal transmission. No recent flares reported. -Change Valtrex dose to 500mg  twice daily for suppression         Relevant Medications   valACYclovir (VALTREX) 500 MG tablet       Follow-up if symptoms worsen or fail to improve.    I discussed the assessment and treatment plan with the patient. The patient was provided an opportunity to ask questions and all were answered. The patient agreed with the plan and demonstrated an understanding of the instructions.   The patient was advised to call back or seek an in-person evaluation if the symptoms worsen or if the condition fails to improve as anticipated.   Clayborne Dana, NP

## 2023-11-24 ENCOUNTER — Other Ambulatory Visit: Payer: Self-pay | Admitting: Obstetrics and Gynecology

## 2023-11-24 ENCOUNTER — Encounter (HOSPITAL_COMMUNITY): Payer: Self-pay

## 2023-11-27 ENCOUNTER — Encounter: Payer: Self-pay | Admitting: Physical Therapy

## 2023-11-27 ENCOUNTER — Ambulatory Visit: Payer: 59 | Admitting: Physical Therapy

## 2023-11-27 DIAGNOSIS — M25551 Pain in right hip: Secondary | ICD-10-CM

## 2023-11-27 DIAGNOSIS — M5459 Other low back pain: Secondary | ICD-10-CM

## 2023-11-27 DIAGNOSIS — R252 Cramp and spasm: Secondary | ICD-10-CM

## 2023-11-27 NOTE — Therapy (Addendum)
 OUTPATIENT PHYSICAL THERAPY TREATMENT/Discharge Summary   Patient Name: Debra [name] MRN: [id number] DOB:[birth year], 42 y.o., female Today's Date: 11/27/2023  END OF SESSION:  PT End of Session - 11/27/23 0936     Visit Number 18    Date for PT Re-Evaluation 12/08/23    Authorization Type [id number]    PT Start Time 0935    PT Stop Time 1013    PT Time Calculation (min) 38 min    Activity Tolerance Patient tolerated treatment well    Behavior During Therapy WFL for tasks assessed/performed              Past Medical History:  Diagnosis Date   Acne    Allergic rhinitis    Cold sore 11/02/2016   Depression    GERD (gastroesophageal reflux disease)    Hearing loss of both ears    per pt right > left,  does not wear hearing aids   IDA (iron deficiency anemia)    Incompetent cervix    Infertility, female    Mild obstructive sleep apnea    per pt had sleep study was very mild osa,  did not meet critiria for insurance to pay but pt paid out of pocket since it helps due to dust mite allergy   Prior pregnancy with incompetent cervix in second trimester, antepartum 04/01/2022   03-31-2022 pre-mature ruptured membrane [redacted]wk gestation, nonviable   Uterine polyp    Past Surgical History:  Procedure Laterality Date   CERCLAGE LAPAROSCOPIC ABDOMINAL N/A 07/27/2022   Procedure: CERCLAGE LAPAROSCOPIC CERVICO- ABDOMINAL AND LYSIS OF ADHESIONS;  Surgeon: Fermin Schwab, MD;  Location: [facility];  Service: Gynecology;  Laterality: N/A;   DILATION AND CURETTAGE OF UTERUS N/A 04/01/2022   Procedure: DILATATION AND CURETTAGE;  Surgeon: Shea Evans, MD;  Location: [facility];  Service: Gynecology;  Laterality: N/A;   HYSTEROSCOPY N/A 07/27/2022   Procedure: HYSTEROSCOPY WITH ENDOMETRIAL BIOPSY;  Surgeon: Fermin Schwab, MD;  Location: [facility];  Service: Gynecology;  Laterality: N/A;   Patient Active Problem List   Diagnosis Date Noted    Sleep apnea-like behavior 09/19/2023   Anemia of pregnancy 07/14/2023   Pregnancy 07/03/2023   Pharyngitis 09/18/2022   Well adult exam 06/08/2022   Preterm delivery 04/01/2022   Incompetent cervix in pregnancy, antepartum, second trimester 04/01/2022   Pregnancy resulting from in vitro fertilization in second trimester 03/31/2022   Premature rupture of membranes in second trimester 03/31/2022   Allergic conjunctivitis of both eyes 12/16/2020   Allergic conjunctivitis 09/29/2020   Insomnia 07/18/2018   Elevated ALT measurement 07/18/2018   Snoring 01/03/2017   Chronic low back pain without sciatica 01/03/2017   Vitamin D deficiency 11/02/2016   Morbid obesity with BMI of 40.0-44.9, adult (HCC) 11/02/2016   Dermatographia 11/02/2016   Cold sore 11/02/2016   Perennial allergic rhinitis 11/02/2016   Acne 11/02/2016   Depressive disorder 11/25/2014   Herpes simplex 12/02/2013   Esophageal reflux 12/02/2013    PCP: Clayborne Dana, NP   REFERRING PROVIDER: Olivia Mackie, MD   REFERRING DIAG: M54.50 (ICD-10-CM) - Low back pain, unspecified   Rationale for Evaluation and Treatment: Rehabilitation  THERAPY DIAG:  Other low back pain  Pain in right hip  Cramp and spasm  ONSET DATE: ~ 1 month   SUBJECTIVE:  SUBJECTIVE STATEMENT: Doing well, sore in low back but did well all week.  Husband tried to massage last night and made it a 7. C-section will probably be scheduled on Dec 21.     PERTINENT HISTORY:  [redacted] weeks pregnant, h/o anemia, leukocytosis, LBP  PAIN:  Are you having pain? Yes: NPRS scale: low back 3- 4/10 Pain location:  low back  Pain description: dull, throbbing in low back & sacrum; intermittent low throbbing in glutes and thighs  Aggravating factors: 7-8/10 at worst, laying  on side Relieving factors: heating pad, massage, PT  PRECAUTIONS: Other: pregnant, no DN in lumbar/sacral region  RED FLAGS: None   WEIGHT BEARING RESTRICTIONS: No  FALLS:  Has patient fallen in last 6 months? No  LIVING ENVIRONMENT: Lives with: lives with their spouse Lives in: House/apartment Stairs: Yes: Internal: 14 steps; on right going up and on left going up and External: 1 steps; none Has following equipment at home: None  OCCUPATION: full time at [employer], but works at home most of the time.   PLOF: Independent  PATIENT GOALS: "lesses the pain, strengthen the muscles, stretch things out so not as painful"  NEXT MD VISIT: weekly   OBJECTIVE:   DIAGNOSTIC FINDINGS:  No relevant imaging  PATIENT SURVEYS:  Modified Oswestry 21/50   COGNITION: Overall cognitive status: Within functional limits for tasks assessed     SENSATION: WFL  MUSCLE LENGTH: Hamstrings: Right 90 deg; Left 90 deg  POSTURE: No Significant postural limitations  PALPATION: Tenderness L4/L5, bil QL, glutes and piriformis, SIJ, greater trochanters, Right worse than left.   LUMBAR ROM:  NT  LOWER EXTREMITY ROM:   good hip mobility bilaterally for both hip internal and external rotation.    LOWER EXTREMITY MMT:    MMT Right eval Left eval  Hip flexion 4+ 4+  Hip extension    Hip abduction 5 (in SL) 5 (in SL)  Hip adduction 5 5  Knee flexion 5 5  Knee extension 5 5  Ankle dorsiflexion 5 5  Ankle plantarflexion     (Blank rows = not tested)  LUMBAR SPECIAL TESTS:  Straight leg raise test: Negative and FABER test: Negative  FUNCTIONAL TESTS:    GAIT: Distance walked: 61' Assistive device utilized: None Level of assistance: Complete Independence Comments: no significant deviation or device   TODAY'S TREATMENT:                                                                                                                              DATE:   11/27/23 Therapeutic  Exercise: to improve strength and mobility.  Demo, verbal and tactile cues throughout for technique. Nustep L5 x 4 min for warm- up and muscle perfusion.  Discussed looking up "the belly wisperer" for post C-section exercises (starting with scar tissue densensitization, massage, and diaphragmatic breathing) and importance of core strengthening after c-section to address LBP as well as TrDN.  Manual Therapy: to  decrease muscle spasm and pain and improve mobility STM/TPR to bil QL, lumbar paraspinals, bil glute med and max, piriformis, skilled palpation and monitoring during dry needling. Trigger Point Dry-Needling  Treatment instructions: Expect mild to moderate muscle soreness. S/S of pneumothorax if dry needled over a lung field, and to seek immediate medical attention should they occur. Patient verbalized understanding of these instructions and education. Patient Consent Given: Yes Education handout provided: Yes Muscles treated: bil glute med/max  Electrical stimulation performed: No Parameters: N/A Treatment response/outcome: Twitch Response Elicited and Palpable Increase in Muscle Length  11/20/2023 Therapeutic Exercise: to improve strength and mobility.  Demo, verbal and tactile cues throughout for technique. Nustep L5 x 5 min for warm-up and muscle perfusion Manual Therapy: to decrease muscle spasm and pain and improve mobility STM/TPR to bil QL, lumbar paraspinals, bil glute med and max, piriformis, skilled palpation and monitoring during dry needling. Trigger Point Dry-Needling  Treatment instructions: Expect mild to moderate muscle soreness. S/S of pneumothorax if dry needled over a lung field, and to seek immediate medical attention should they occur. Patient verbalized understanding of these instructions and education. Patient Consent Given: Yes Education handout provided: Yes Muscles treated: bil glute med/max  Electrical stimulation performed: No Parameters: N/A Treatment  response/outcome: Twitch Response Elicited and Palpable Increase in Muscle Length  11/13/2023 Therapeutic Exercise: to improve strength and mobility.  Demo, verbal and tactile cues throughout for technique. Nustep L5 x 5 min for warm-up and muscle perfusion Manual Therapy: to decrease muscle spasm and pain and improve mobility STM/TPR to bil QL, lumbar paraspinals, bil glute med and max, piriformis, skilled palpation and monitoring during dry needling. Trigger Point Dry-Needling  Treatment instructions: Expect mild to moderate muscle soreness. S/S of pneumothorax if dry needled over a lung field, and to seek immediate medical attention should they occur. Patient verbalized understanding of these instructions and education. Patient Consent Given: Yes Education handout provided: Yes Muscles treated: bil glute med/max  Electrical stimulation performed: No Parameters: N/A Treatment response/outcome: Twitch Response Elicited and Palpable Increase in Muscle Length   PATIENT EDUCATION:  Education details: continue HEP Person educated: Patient Education method: Explanation Education comprehension: verbalized understanding  HOME EXERCISE PROGRAM: Access Code: [email or web address] URL: [email or web address] Date: 08/28/2023 Prepared by: Harrie Foreman  Exercises - Supine Diaphragmatic Breathing  - 1 x daily - 7 x weekly - 1 sets - 10 reps - Seated Diaphragmatic Breathing  - 1 x daily - 7 x weekly - 1 sets - 10 reps - Supine Posterior Pelvic Tilt  - 1 x daily - 7 x weekly - 1 sets - 10 reps - Seated Pelvic Tilt  - 1 x daily - 7 x weekly - 1 sets - 10 reps - Posterior Pelvic Tilt with Adduction Using Pillow in Hooklying  - 1 x daily - 7 x weekly - 1 sets - 10 reps - Bent Knee Fallouts  - 1 x daily - 7 x weekly - 1 sets - 10 reps - Supine Posterior Pelvic Tilt with Knee Rocks  - 1 x daily - 7 x weekly - 1 sets - 10 reps   ASSESSMENT:  CLINICAL IMPRESSION: Debra [name] continues to do well  with pain management with current plan of care.  If everything goes as planned, will schedule Csection for Dec 20 or 21, so should have 1 more visit.  Spoke today about post-C section care and recommendations for video series to check out for scar tissue mobilization and diaphragmatic breathing, before she would be  able to get back to formal PT again.  Continued to focus on more manual therapy to low back and QL after brief TrDN to glutes.  Noted more tissue banding in lumbar erector spinae and QL today.  Debra [name] continues to demonstrate potential for improvement and would benefit from continued skilled therapy to address impairments.    OBJECTIVE IMPAIRMENTS: decreased activity tolerance, increased fascial restrictions, increased muscle spasms, and pain.   ACTIVITY LIMITATIONS: carrying, lifting, bending, sleeping, bed mobility, and locomotion level  PARTICIPATION LIMITATIONS: meal prep, cleaning, laundry, shopping, and community activity  PERSONAL FACTORS: Past/current experiences and 1-2 comorbidities: history of LBP, pregnancy, obesity   are also affecting patient's functional outcome.   REHAB POTENTIAL: Good  CLINICAL DECISION MAKING: Evolving/moderate complexity  EVALUATION COMPLEXITY: Moderate   GOALS: Goals reviewed with patient? Yes  SHORT TERM GOALS: Target date: 09/07/2023   Patient will be independent with initial HEP.  Baseline:  Given 08/28/23 Goal status: MET 09/04/23   LONG TERM GOALS: Target date: 11/16/2023  extended to 12/08/23 Patient will be independent with advanced/ongoing HEP to improve outcomes and carryover.  Baseline:   Goal status: MET for current   2.  Patient will report 50% improvement in low back pain/R hip pain to improve QOL.  Baseline:  Goal status: MET  3.  Patient will demonstrate full pain free lumbar ROM to perform ADLs.   Baseline:  Goal status: IN PROGRESS  4.  Patient will report at least 6 points improvement on modified Oswestry to  demonstrate improved functional ability.  Baseline: 21/50 Goal status: IN PROGRESS   5. Patient will report 50% improvement in sleep quality  Baseline: increased pain at night preventing sleep Goal status: IN PROGRESS   PLAN:  PT FREQUENCY: 1x/week for 4 weeks.   PT DURATION: 12 weeks  PLANNED INTERVENTIONS: Therapeutic exercises, Therapeutic activity, Neuromuscular re-education, Balance training, Gait training, Patient/Family education, Self Care, Joint mobilization, Dry Needling, Electrical stimulation, Spinal mobilization, Cryotherapy, Moist heat, Manual therapy, and Re-evaluation.  PLAN FOR NEXT SESSION: core stabilization exercises, can lie on back for short periods but try in semi-reclined, manual therapy, TrDN to glutes/TFL only, no needling in spine region.  Consider Ktape to abdomen also.     Jena Gauss, PT, DPT  11/27/2023, 10:41 AM    PHYSICAL THERAPY DISCHARGE SUMMARY  Visits from Start of Care: 18  Current functional level related to goals / functional outcomes: See above   Remaining deficits: See above   Education / Equipment: HEP  Plan: Patient agrees to discharge.  Patient cancelled remaining visits after going into labor  ~ 12/06/2023.    Jena Gauss, PT  02/08/2024 5:09 PM

## 2023-12-01 ENCOUNTER — Encounter (HOSPITAL_COMMUNITY)
Admission: RE | Admit: 2023-12-01 | Discharge: 2023-12-01 | Disposition: A | Discharge status: Home / Self Care | Payer: 59 | Source: Ambulatory Visit

## 2023-12-02 ENCOUNTER — Inpatient Hospital Stay (HOSPITAL_COMMUNITY): Payer: 59 | Admitting: Anesthesiology

## 2023-12-02 ENCOUNTER — Encounter (HOSPITAL_COMMUNITY)
Admission: AD | Disposition: A | Discharge status: Home / Self Care | Payer: Self-pay | Source: Home / Self Care | Attending: Obstetrics and Gynecology

## 2023-12-02 ENCOUNTER — Encounter (HOSPITAL_COMMUNITY): Payer: Self-pay | Admitting: Obstetrics

## 2023-12-02 ENCOUNTER — Inpatient Hospital Stay (HOSPITAL_COMMUNITY)
Admission: AD | Admit: 2023-12-02 | Discharge: 2023-12-06 | DRG: 786 | Disposition: A | Discharge status: Home / Self Care | Payer: 59 | Attending: Obstetrics and Gynecology | Admitting: Obstetrics and Gynecology

## 2023-12-02 ENCOUNTER — Other Ambulatory Visit: Payer: Self-pay

## 2023-12-02 DIAGNOSIS — H9193 Unspecified hearing loss, bilateral: Secondary | ICD-10-CM | POA: Diagnosis present

## 2023-12-02 DIAGNOSIS — O9081 Anemia of the puerperium: Secondary | ICD-10-CM | POA: Diagnosis not present

## 2023-12-02 DIAGNOSIS — O1414 Severe pre-eclampsia complicating childbirth: Principal | ICD-10-CM | POA: Diagnosis present

## 2023-12-02 DIAGNOSIS — D62 Acute posthemorrhagic anemia: Secondary | ICD-10-CM | POA: Diagnosis not present

## 2023-12-02 DIAGNOSIS — K219 Gastro-esophageal reflux disease without esophagitis: Secondary | ICD-10-CM | POA: Diagnosis present

## 2023-12-02 DIAGNOSIS — E871 Hypo-osmolality and hyponatremia: Secondary | ICD-10-CM | POA: Diagnosis present

## 2023-12-02 DIAGNOSIS — O9962 Diseases of the digestive system complicating childbirth: Secondary | ICD-10-CM | POA: Diagnosis present

## 2023-12-02 DIAGNOSIS — O3433 Maternal care for cervical incompetence, third trimester: Secondary | ICD-10-CM | POA: Diagnosis present

## 2023-12-02 DIAGNOSIS — O99284 Endocrine, nutritional and metabolic diseases complicating childbirth: Secondary | ICD-10-CM | POA: Diagnosis present

## 2023-12-02 DIAGNOSIS — O99214 Obesity complicating childbirth: Secondary | ICD-10-CM | POA: Diagnosis present

## 2023-12-02 DIAGNOSIS — E860 Dehydration: Secondary | ICD-10-CM | POA: Diagnosis present

## 2023-12-02 DIAGNOSIS — O1490 Unspecified pre-eclampsia, unspecified trimester: Secondary | ICD-10-CM | POA: Diagnosis present

## 2023-12-02 DIAGNOSIS — R7989 Other specified abnormal findings of blood chemistry: Secondary | ICD-10-CM | POA: Diagnosis present

## 2023-12-02 DIAGNOSIS — Z3A36 36 weeks gestation of pregnancy: Secondary | ICD-10-CM

## 2023-12-02 DIAGNOSIS — O368130 Decreased fetal movements, third trimester, not applicable or unspecified: Secondary | ICD-10-CM | POA: Diagnosis present

## 2023-12-02 DIAGNOSIS — O1413 Severe pre-eclampsia, third trimester: Principal | ICD-10-CM | POA: Diagnosis present

## 2023-12-02 LAB — COMPREHENSIVE METABOLIC PANEL
ALT: 13 U/L (ref 0–44)
AST: 18 U/L (ref 15–41)
Albumin: 2 g/dL — ABNORMAL LOW (ref 3.5–5.0)
Alkaline Phosphatase: 108 U/L (ref 38–126)
Anion gap: 9 (ref 5–15)
BUN: 12 mg/dL (ref 6–20)
CO2: 19 mmol/L — ABNORMAL LOW (ref 22–32)
Calcium: 9.2 mg/dL (ref 8.9–10.3)
Chloride: 107 mmol/L (ref 98–111)
Creatinine, Ser: 0.95 mg/dL (ref 0.44–1.00)
GFR, Estimated: >60 mL/min (ref >60)
Glucose, Bld: 96 mg/dL (ref 70–99)
Potassium: 3.9 mmol/L (ref 3.5–5.1)
Sodium: 135 mmol/L (ref 135–145)
Total Bilirubin: 0.4 mg/dL (ref <1.2)
Total Protein: 5.8 g/dL — ABNORMAL LOW (ref 6.5–8.1)

## 2023-12-02 LAB — CBC WITH DIFFERENTIAL/PLATELET
Abs Immature Granulocytes: 0.03 10*3/uL (ref 0.00–0.07)
Basophils Absolute: 0.1 10*3/uL (ref 0.0–0.1)
Basophils Relative: 1 %
Eosinophils Absolute: 0.1 10*3/uL (ref 0.0–0.5)
Eosinophils Relative: 1 %
HCT: 42.3 % (ref 36.0–46.0)
Hemoglobin: 13.8 g/dL (ref 12.0–15.0)
Immature Granulocytes: 0 %
Lymphocytes Relative: 29 %
Lymphs Abs: 2.9 10*3/uL (ref 0.7–4.0)
MCH: 26.5 pg (ref 26.0–34.0)
MCHC: 32.6 g/dL (ref 30.0–36.0)
MCV: 81.2 fL (ref 80.0–100.0)
Monocytes Absolute: 0.6 10*3/uL (ref 0.1–1.0)
Monocytes Relative: 6 %
Neutro Abs: 6.4 10*3/uL (ref 1.7–7.7)
Neutrophils Relative %: 63 %
Platelets: 237 10*3/uL (ref 150–400)
RBC: 5.21 MIL/uL — ABNORMAL HIGH (ref 3.87–5.11)
RDW: 15.9 % — ABNORMAL HIGH (ref 11.5–15.5)
WBC: 10 10*3/uL (ref 4.0–10.5)
nRBC: 0 % (ref 0.0–0.2)

## 2023-12-02 LAB — PROTEIN / CREATININE RATIO, URINE
Creatinine, Urine: 193 mg/dL
Protein Creatinine Ratio: 15.4 mg/mg{creat} — ABNORMAL HIGH (ref 0.00–0.15)
Total Protein, Urine: 2972 mg/dL

## 2023-12-02 LAB — TYPE AND SCREEN
ABO/RH(D): O POS
Antibody Screen: NEGATIVE

## 2023-12-02 SURGERY — Surgical Case
Anesthesia: Spinal

## 2023-12-02 MED ORDER — OXYTOCIN-SODIUM CHLORIDE 30-0.9 UT/500ML-% IV SOLN
INTRAVENOUS | Status: AC
  Filled 2023-12-02: qty 500

## 2023-12-02 MED ORDER — LIDOCAINE 5 % EX PTCH
2.0000 | MEDICATED_PATCH | CUTANEOUS | Status: DC
  Administered 2023-12-02 – 2023-12-05 (×4): 2 via TRANSDERMAL
  Filled 2023-12-02 (×5): qty 2

## 2023-12-02 MED ORDER — COCONUT OIL OIL
1.0000 | TOPICAL_OIL | Status: DC | PRN
  Administered 2023-12-03: 1 via TOPICAL

## 2023-12-02 MED ORDER — STERILE WATER FOR IRRIGATION IR SOLN
Status: DC | PRN
  Administered 2023-12-02: 1

## 2023-12-02 MED ORDER — ACETAMINOPHEN 500 MG PO TABS
1000.0000 mg | ORAL_TABLET | Freq: Once | ORAL | Status: DC
Start: 2023-12-02 — End: 2023-12-02

## 2023-12-02 MED ORDER — FENTANYL CITRATE (PF) 100 MCG/2ML IJ SOLN
INTRAMUSCULAR | Status: DC | PRN
  Administered 2023-12-02: 15 ug via INTRATHECAL

## 2023-12-02 MED ORDER — NIFEDIPINE ER OSMOTIC RELEASE 30 MG PO TB24: 30.0000 mg | ORAL_TABLET | ORAL | Status: DC

## 2023-12-02 MED ORDER — WITCH HAZEL-GLYCERIN EX PADS: 1.0000 | MEDICATED_PAD | CUTANEOUS | Status: DC | PRN

## 2023-12-02 MED ORDER — DEXAMETHASONE SODIUM PHOSPHATE 10 MG/ML IJ SOLN
INTRAMUSCULAR | Status: AC
  Filled 2023-12-02: qty 1

## 2023-12-02 MED ORDER — KETOROLAC TROMETHAMINE 30 MG/ML IJ SOLN
30.0000 mg | Freq: Once | INTRAMUSCULAR | Status: AC
  Administered 2023-12-02: 30 mg via INTRAVENOUS

## 2023-12-02 MED ORDER — OMEGA-3 FISH OIL 1200 MG PO CAPS
4800.0000 mg | ORAL_CAPSULE | Freq: Every day | ORAL | Status: DC
  Filled 2023-12-02: qty 4

## 2023-12-02 MED ORDER — DIBUCAINE (PERIANAL) 1 % EX OINT: 1.0000 | TOPICAL_OINTMENT | CUTANEOUS | Status: DC | PRN

## 2023-12-02 MED ORDER — ONDANSETRON HCL 4 MG/2ML IJ SOLN
INTRAMUSCULAR | Status: AC
  Filled 2023-12-02: qty 2

## 2023-12-02 MED ORDER — SODIUM CHLORIDE 0.9 % IV SOLN
3.0000 g | INTRAVENOUS | Status: AC
  Administered 2023-12-02: 3 g via INTRAVENOUS
  Filled 2023-12-02: qty 3

## 2023-12-02 MED ORDER — MORPHINE SULFATE (PF) 0.5 MG/ML IJ SOLN
INTRAMUSCULAR | Status: AC
  Filled 2023-12-02: qty 10

## 2023-12-02 MED ORDER — OXYTOCIN-SODIUM CHLORIDE 30-0.9 UT/500ML-% IV SOLN: 2.5000 [IU]/h | INTRAVENOUS | Status: AC

## 2023-12-02 MED ORDER — ACETAMINOPHEN 10 MG/ML IV SOLN
INTRAVENOUS | Status: AC
  Filled 2023-12-02: qty 100

## 2023-12-02 MED ORDER — KETOROLAC TROMETHAMINE 30 MG/ML IJ SOLN
30.0000 mg | Freq: Four times a day (QID) | INTRAMUSCULAR | Status: AC
  Administered 2023-12-03 (×4): 30 mg via INTRAVENOUS
  Filled 2023-12-02 (×4): qty 1

## 2023-12-02 MED ORDER — VITAMIN D 25 MCG (1000 UNIT) PO TABS
1000.0000 [IU] | ORAL_TABLET | Freq: Every day | ORAL | Status: DC
  Administered 2023-12-03 – 2023-12-06 (×4): 1000 [IU] via ORAL
  Filled 2023-12-02 (×4): qty 1

## 2023-12-02 MED ORDER — ACETAMINOPHEN 160 MG/5ML PO SOLN
1000.0000 mg | Freq: Once | ORAL | Status: DC
Start: 2023-12-02 — End: 2023-12-02

## 2023-12-02 MED ORDER — ACETAMINOPHEN 500 MG PO TABS
1000.0000 mg | ORAL_TABLET | Freq: Four times a day (QID) | ORAL | Status: DC
  Administered 2023-12-03 – 2023-12-06 (×15): 1000 mg via ORAL
  Filled 2023-12-02 (×15): qty 2

## 2023-12-02 MED ORDER — PHENYLEPHRINE HCL-NACL 20-0.9 MG/250ML-% IV SOLN
INTRAVENOUS | Status: DC | PRN
  Administered 2023-12-02: 60 ug/min via INTRAVENOUS

## 2023-12-02 MED ORDER — LACTATED RINGERS IV SOLN: INTRAVENOUS | Status: DC

## 2023-12-02 MED ORDER — VALACYCLOVIR HCL 500 MG PO TABS
500.0000 mg | ORAL_TABLET | Freq: Two times a day (BID) | ORAL | Status: DC
  Administered 2023-12-02 – 2023-12-06 (×8): 500 mg via ORAL
  Filled 2023-12-02 (×9): qty 1

## 2023-12-02 MED ORDER — CARBOPROST TROMETHAMINE 250 MCG/ML IM SOLN
INTRAMUSCULAR | Status: DC | PRN
  Administered 2023-12-02: 250 ug via INTRAMUSCULAR

## 2023-12-02 MED ORDER — SIMETHICONE 80 MG PO CHEW: 80.0000 mg | CHEWABLE_TABLET | ORAL | Status: DC | PRN

## 2023-12-02 MED ORDER — ENOXAPARIN SODIUM 80 MG/0.8ML IJ SOSY
70.0000 mg | PREFILLED_SYRINGE | INTRAMUSCULAR | Status: DC
  Administered 2023-12-03 – 2023-12-06 (×4): 70 mg via SUBCUTANEOUS
  Filled 2023-12-02 (×4): qty 0.8

## 2023-12-02 MED ORDER — SIMETHICONE 80 MG PO CHEW
80.0000 mg | CHEWABLE_TABLET | Freq: Three times a day (TID) | ORAL | Status: DC
  Administered 2023-12-03 – 2023-12-06 (×11): 80 mg via ORAL
  Filled 2023-12-02 (×11): qty 1

## 2023-12-02 MED ORDER — KETOROLAC TROMETHAMINE 30 MG/ML IJ SOLN: 30.0000 mg | Freq: Four times a day (QID) | INTRAMUSCULAR | Status: DC | PRN

## 2023-12-02 MED ORDER — ONDANSETRON HCL 4 MG/2ML IJ SOLN
INTRAMUSCULAR | Status: DC | PRN
  Administered 2023-12-02: 4 mg via INTRAVENOUS

## 2023-12-02 MED ORDER — LORATADINE 10 MG PO TABS
10.0000 mg | ORAL_TABLET | Freq: Every day | ORAL | Status: DC
  Administered 2023-12-02 – 2023-12-05 (×4): 10 mg via ORAL
  Filled 2023-12-02 (×4): qty 1

## 2023-12-02 MED ORDER — ONDANSETRON HCL 4 MG/2ML IJ SOLN
4.0000 mg | Freq: Three times a day (TID) | INTRAMUSCULAR | Status: DC | PRN
  Administered 2023-12-03: 4 mg via INTRAVENOUS
  Filled 2023-12-02: qty 2

## 2023-12-02 MED ORDER — PANTOPRAZOLE SODIUM 20 MG PO TBEC
20.0000 mg | DELAYED_RELEASE_TABLET | Freq: Every day | ORAL | Status: DC
  Administered 2023-12-03 – 2023-12-06 (×4): 20 mg via ORAL
  Filled 2023-12-02 (×4): qty 1

## 2023-12-02 MED ORDER — KETOROLAC TROMETHAMINE 30 MG/ML IJ SOLN
INTRAMUSCULAR | Status: AC
Start: 2023-12-02 — End: ?
  Filled 2023-12-02: qty 1

## 2023-12-02 MED ORDER — MENTHOL 3 MG MT LOZG: 1.0000 | LOZENGE | OROMUCOSAL | Status: DC | PRN

## 2023-12-02 MED ORDER — HYDRALAZINE HCL 20 MG/ML IJ SOLN: 10.0000 mg | INTRAMUSCULAR | Status: DC | PRN

## 2023-12-02 MED ORDER — AZITHROMYCIN 500 MG IV SOLR
INTRAVENOUS | Status: AC
  Filled 2023-12-02: qty 5

## 2023-12-02 MED ORDER — FENTANYL CITRATE (PF) 100 MCG/2ML IJ SOLN
25.0000 ug | INTRAMUSCULAR | Status: DC | PRN
  Administered 2023-12-02: 50 ug via INTRAVENOUS
  Administered 2023-12-02: 25 ug via INTRAVENOUS

## 2023-12-02 MED ORDER — NIFEDIPINE ER OSMOTIC RELEASE 30 MG PO TB24
30.0000 mg | ORAL_TABLET | Freq: Every morning | ORAL | Status: DC
  Filled 2023-12-02: qty 1

## 2023-12-02 MED ORDER — DIPHENOXYLATE-ATROPINE 2.5-0.025 MG PO TABS
ORAL_TABLET | ORAL | Status: AC
  Filled 2023-12-02: qty 2

## 2023-12-02 MED ORDER — LABETALOL HCL 5 MG/ML IV SOLN: 80.0000 mg | INTRAVENOUS | Status: DC | PRN

## 2023-12-02 MED ORDER — SODIUM CHLORIDE 0.9% FLUSH: 3.0000 mL | INTRAVENOUS | Status: DC | PRN

## 2023-12-02 MED ORDER — NALOXONE HCL 4 MG/10ML IJ SOLN: 1.0000 ug/kg/h | INTRAVENOUS | Status: DC | PRN

## 2023-12-02 MED ORDER — FENTANYL CITRATE (PF) 100 MCG/2ML IJ SOLN
INTRAMUSCULAR | Status: DC | PRN
  Administered 2023-12-02: 85 ug via INTRAVENOUS

## 2023-12-02 MED ORDER — NIFEDIPINE ER OSMOTIC RELEASE 60 MG PO TB24
60.0000 mg | ORAL_TABLET | Freq: Every day | ORAL | Status: DC
  Administered 2023-12-02 – 2023-12-05 (×4): 60 mg via ORAL
  Filled 2023-12-02 (×4): qty 1

## 2023-12-02 MED ORDER — PRENATAL+DHA 28-0.975 & 200 MG PO MISC: Freq: Every day | ORAL | Status: DC

## 2023-12-02 MED ORDER — DIPHENHYDRAMINE HCL 25 MG PO CAPS: 25.0000 mg | ORAL_CAPSULE | ORAL | Status: DC | PRN

## 2023-12-02 MED ORDER — OXYCODONE HCL 5 MG PO TABS
5.0000 mg | ORAL_TABLET | ORAL | Status: DC | PRN
  Administered 2023-12-04 – 2023-12-05 (×4): 10 mg via ORAL
  Filled 2023-12-02 (×4): qty 2

## 2023-12-02 MED ORDER — NALOXONE HCL 0.4 MG/ML IJ SOLN: 0.4000 mg | INTRAMUSCULAR | Status: DC | PRN

## 2023-12-02 MED ORDER — EPHEDRINE SULFATE (PRESSORS) 50 MG/ML IJ SOLN
INTRAMUSCULAR | Status: DC | PRN
  Administered 2023-12-02 (×3): 10 mg via INTRAVENOUS

## 2023-12-02 MED ORDER — LABETALOL HCL 5 MG/ML IV SOLN
40.0000 mg | INTRAVENOUS | Status: DC | PRN
  Administered 2023-12-02: 40 mg via INTRAVENOUS
  Filled 2023-12-02: qty 8

## 2023-12-02 MED ORDER — SODIUM CHLORIDE 0.9 % IR SOLN
Status: DC | PRN
  Administered 2023-12-02: 1

## 2023-12-02 MED ORDER — PSYLLIUM 95 % PO PACK
1.0000 | PACK | Freq: Every day | ORAL | Status: DC
  Filled 2023-12-02 (×4): qty 1

## 2023-12-02 MED ORDER — SOD CITRATE-CITRIC ACID 500-334 MG/5ML PO SOLN
30.0000 mL | ORAL | Status: AC
  Administered 2023-12-02: 30 mL via ORAL
  Filled 2023-12-02: qty 30

## 2023-12-02 MED ORDER — BUPIVACAINE HCL (PF) 0.25 % IJ SOLN
INTRAMUSCULAR | Status: DC | PRN
  Administered 2023-12-02: 30 mL

## 2023-12-02 MED ORDER — OMEGA-3-ACID ETHYL ESTERS 1 G PO CAPS
4.0000 g | ORAL_CAPSULE | Freq: Every day | ORAL | Status: DC
Start: 2023-12-03 — End: 2023-12-06
  Administered 2023-12-03 – 2023-12-06 (×4): 4 g via ORAL
  Filled 2023-12-02 (×4): qty 4

## 2023-12-02 MED ORDER — DEXAMETHASONE SODIUM PHOSPHATE 10 MG/ML IJ SOLN
INTRAMUSCULAR | Status: DC | PRN
  Administered 2023-12-02: 10 mg via INTRAVENOUS

## 2023-12-02 MED ORDER — ACETAMINOPHEN 500 MG PO TABS: 1000.0000 mg | ORAL_TABLET | Freq: Four times a day (QID) | ORAL | Status: DC

## 2023-12-02 MED ORDER — MORPHINE SULFATE (PF) 0.5 MG/ML IJ SOLN
INTRAMUSCULAR | Status: DC | PRN
  Administered 2023-12-02: 150 ug via INTRATHECAL

## 2023-12-02 MED ORDER — DIPHENHYDRAMINE HCL 25 MG PO CAPS: 50.0000 mg | ORAL_CAPSULE | Freq: Four times a day (QID) | ORAL | Status: DC | PRN

## 2023-12-02 MED ORDER — DIPHENOXYLATE-ATROPINE 2.5-0.025 MG PO TABS
2.0000 | ORAL_TABLET | Freq: Once | ORAL | Status: AC
  Administered 2023-12-02: 2 via ORAL

## 2023-12-02 MED ORDER — LABETALOL HCL 5 MG/ML IV SOLN
20.0000 mg | INTRAVENOUS | Status: DC | PRN
  Administered 2023-12-02: 20 mg via INTRAVENOUS
  Filled 2023-12-02: qty 4

## 2023-12-02 MED ORDER — FENTANYL CITRATE (PF) 100 MCG/2ML IJ SOLN
INTRAMUSCULAR | Status: AC
  Filled 2023-12-02: qty 2

## 2023-12-02 MED ORDER — DROPERIDOL 2.5 MG/ML IJ SOLN: 0.6250 mg | Freq: Once | INTRAMUSCULAR | Status: DC | PRN

## 2023-12-02 MED ORDER — MAGNESIUM SULFATE BOLUS VIA INFUSION
4.0000 g | Freq: Once | INTRAVENOUS | Status: AC
  Administered 2023-12-02: 4 g via INTRAVENOUS
  Filled 2023-12-02: qty 1000

## 2023-12-02 MED ORDER — LACTATED RINGERS IV SOLN: INTRAVENOUS | Status: DC | PRN

## 2023-12-02 MED ORDER — TRANEXAMIC ACID-NACL 1000-0.7 MG/100ML-% IV SOLN
INTRAVENOUS | Status: AC
  Filled 2023-12-02: qty 100

## 2023-12-02 MED ORDER — PRENATAL MULTIVITAMIN CH
1.0000 | ORAL_TABLET | Freq: Every day | ORAL | Status: DC
  Administered 2023-12-03 – 2023-12-06 (×4): 1 via ORAL
  Filled 2023-12-02 (×4): qty 1

## 2023-12-02 MED ORDER — ACETAMINOPHEN 10 MG/ML IV SOLN
INTRAVENOUS | Status: DC | PRN
  Administered 2023-12-02: 1000 mg via INTRAVENOUS

## 2023-12-02 MED ORDER — DIPHENHYDRAMINE HCL 50 MG/ML IJ SOLN: 12.5000 mg | INTRAMUSCULAR | Status: DC | PRN

## 2023-12-02 MED ORDER — SENNOSIDES-DOCUSATE SODIUM 8.6-50 MG PO TABS
2.0000 | ORAL_TABLET | Freq: Every day | ORAL | Status: DC
  Administered 2023-12-03 – 2023-12-06 (×4): 2 via ORAL
  Filled 2023-12-02 (×4): qty 2

## 2023-12-02 MED ORDER — CALCIUM CITRATE 950 (200 CA) MG PO TABS
2.0000 | ORAL_TABLET | Freq: Every day | ORAL | Status: DC
  Administered 2023-12-03 – 2023-12-06 (×4): 400 mg via ORAL
  Filled 2023-12-02 (×4): qty 2

## 2023-12-02 MED ORDER — BUPIVACAINE IN DEXTROSE 0.75-8.25 % IT SOLN
INTRATHECAL | Status: DC | PRN
  Administered 2023-12-02: 1.6 mL via INTRATHECAL

## 2023-12-02 MED ORDER — TRANEXAMIC ACID-NACL 1000-0.7 MG/100ML-% IV SOLN
INTRAVENOUS | Status: DC | PRN
  Administered 2023-12-02: 1000 mg via INTRAVENOUS

## 2023-12-02 MED ORDER — IBUPROFEN 600 MG PO TABS
600.0000 mg | ORAL_TABLET | Freq: Four times a day (QID) | ORAL | Status: DC
  Administered 2023-12-03 – 2023-12-06 (×10): 600 mg via ORAL
  Filled 2023-12-02 (×10): qty 1

## 2023-12-02 MED ORDER — BUPIVACAINE HCL (PF) 0.25 % IJ SOLN
INTRAMUSCULAR | Status: AC
  Filled 2023-12-02: qty 10

## 2023-12-02 MED ORDER — OXYTOCIN-SODIUM CHLORIDE 30-0.9 UT/500ML-% IV SOLN
INTRAVENOUS | Status: DC | PRN
  Administered 2023-12-02: 999 mL/h via INTRAVENOUS

## 2023-12-02 MED ORDER — MAGNESIUM SULFATE 40 GM/1000ML IV SOLN
2.0000 g/h | INTRAVENOUS | Status: AC
  Administered 2023-12-02 – 2023-12-03 (×2): 2 g/h via INTRAVENOUS
  Filled 2023-12-02 (×2): qty 1000

## 2023-12-02 MED ORDER — LABETALOL HCL 5 MG/ML IV SOLN
INTRAVENOUS | Status: AC
  Filled 2023-12-02: qty 4

## 2023-12-02 SURGICAL SUPPLY — 37 items
BENZOIN TINCTURE PRP APPL 2/3 (GAUZE/BANDAGES/DRESSINGS) IMPLANT
CHLORAPREP W/TINT 26 (MISCELLANEOUS) ×2 IMPLANT
CLAMP UMBILICAL CORD (MISCELLANEOUS) ×1 IMPLANT
CLOTH BEACON ORANGE TIMEOUT ST (SAFETY) ×1 IMPLANT
DRESSING PREVENA PLUS CUSTOM (GAUZE/BANDAGES/DRESSINGS) IMPLANT
DRSG OPSITE POSTOP 4X10 (GAUZE/BANDAGES/DRESSINGS) ×1 IMPLANT
DRSG PREVENA PLUS CUSTOM (GAUZE/BANDAGES/DRESSINGS) ×1
ELECT REM PT RETURN 9FT ADLT (ELECTROSURGICAL) ×1
ELECTRODE REM PT RTRN 9FT ADLT (ELECTROSURGICAL) ×1 IMPLANT
EXTRACTOR VACUUM KIWI (MISCELLANEOUS) IMPLANT
GLOVE BIOGEL PI IND STRL 7.0 (GLOVE) ×1 IMPLANT
GLOVE SURG LTX SZ8 (GLOVE) ×1 IMPLANT
GOWN STRL REUS W/TWL LRG LVL3 (GOWN DISPOSABLE) ×2 IMPLANT
HEMOSTAT ARISTA ABSORB 3G PWDR (HEMOSTASIS) IMPLANT
KIT ABG SYR 3ML LUER SLIP (SYRINGE) IMPLANT
NDL HYPO 25X5/8 SAFETYGLIDE (NEEDLE) IMPLANT
NDL SPNL 20GX3.5 QUINCKE YW (NEEDLE) IMPLANT
NEEDLE HYPO 22GX1.5 SAFETY (NEEDLE) ×1 IMPLANT
NEEDLE HYPO 25X5/8 SAFETYGLIDE (NEEDLE) IMPLANT
NEEDLE SPNL 20GX3.5 QUINCKE YW (NEEDLE) IMPLANT
NS IRRIG 1000ML POUR BTL (IV SOLUTION) ×1 IMPLANT
PACK C SECTION WH (CUSTOM PROCEDURE TRAY) ×1 IMPLANT
SPONGE T-LAP 18X18 ~~LOC~~+RFID (SPONGE) IMPLANT
STRIP CLOSURE SKIN 1/2X4 (GAUZE/BANDAGES/DRESSINGS) IMPLANT
SUT MNCRL 0 VIOLET CTX 36 (SUTURE) ×2 IMPLANT
SUT MNCRL AB 3-0 PS2 27 (SUTURE) IMPLANT
SUT MON AB 2-0 CT1 27 (SUTURE) ×1 IMPLANT
SUT MON AB-0 CT1 36 (SUTURE) ×2 IMPLANT
SUT PLAIN 0 NONE (SUTURE) IMPLANT
SUT PLAIN 2 0 XLH (SUTURE) IMPLANT
SUT PLAIN ABS 2-0 CT1 27XMFL (SUTURE) IMPLANT
SUT VIC AB 0 CTX36XBRD ANBCTRL (SUTURE) IMPLANT
SUTURE PLAIN GUT 2.0 ETHICON (SUTURE) IMPLANT
SYR CONTROL 10ML LL (SYRINGE) ×1 IMPLANT
TOWEL OR 17X24 6PK STRL BLUE (TOWEL DISPOSABLE) ×1 IMPLANT
TRAY FOLEY W/BAG SLVR 14FR LF (SET/KITS/TRAYS/PACK) ×1 IMPLANT
WATER STERILE IRR 1000ML POUR (IV SOLUTION) ×1 IMPLANT

## 2023-12-02 NOTE — Anesthesia Procedure Notes (Signed)
Spinal  Patient location during procedure: OR Start time: 12/02/2023 4:08 PM End time: 12/02/2023 4:12 PM Reason for block: surgical anesthesia Staffing Performed: anesthesiologist  Anesthesiologist: Kaylyn Layer, MD Performed by: Kaylyn Layer, MD Authorized by: Kaylyn Layer, MD   Preanesthetic Checklist Completed: patient identified, IV checked, risks and benefits discussed, monitors and equipment checked, pre-op evaluation and timeout performed Spinal Block Patient position: sitting Prep: DuraPrep and site prepped and draped Patient monitoring: heart rate, continuous pulse ox and blood pressure Approach: midline Location: L3-4 Injection technique: single-shot Needle Needle type: Pencan  Needle gauge: 24 G Needle length: 10 cm Assessment Sensory level: T4 Events: CSF return Additional Notes Risks, benefits, and alternative discussed. Patient gave consent to procedure. Prepped and draped in sitting position. Clear CSF obtained after one needle redirection. Positive terminal aspiration. No pain or paraesthesias with injection. Patient tolerated procedure well. Vital signs stable. Amalia Greenhouse, MD

## 2023-12-02 NOTE — MAU Provider Note (Signed)
History     CSN: [id number]  Arrival date and time: 12/02/23 1123   Event Date/Time   First Provider Initiated Contact with Patient    Chief Complaint  Patient presents with   Decreased Fetal Movement    HPI  Debra [name] is a 42 y.o. G3P0010 at [redacted]w[redacted]d who presents to the MAU for decreased fetal movement. Patient reports ongoing x 13 hours. She typically is unable to feel more subtle movements, but has not felt any kicking since yesterday night. She tried drinking orange juice, with which she typically notices more fetal activity, but did not notice any improvement today. She has known gestational HTN.   Pt also with PIH, had elevated BP on admit. She specifically denies HA, vision changes, CP, SOB, RUQ pain, or edema.   Past Medical History:  Diagnosis Date   Acne    Allergic rhinitis    Cold sore 11/02/2016   Depression    GERD (gastroesophageal reflux disease)    Hearing loss of both ears    per pt right > left,  does not wear hearing aids   IDA (iron deficiency anemia)    Incompetent cervix    Infertility, female    Mild obstructive sleep apnea    per pt had sleep study was very mild osa,  did not meet critiria for insurance to pay but pt paid out of pocket since it helps due to dust mite allergy   Prior pregnancy with incompetent cervix in second trimester, antepartum 04/01/2022   03-31-2022 pre-mature ruptured membrane [redacted]wk gestation, nonviable   Uterine polyp     Past Surgical History:  Procedure Laterality Date   CERCLAGE LAPAROSCOPIC ABDOMINAL N/A 07/27/2022   Procedure: CERCLAGE LAPAROSCOPIC CERVICO- ABDOMINAL AND LYSIS OF ADHESIONS;  Surgeon: Fermin Schwab, MD;  Location: [facility];  Service: Gynecology;  Laterality: N/A;   DILATION AND CURETTAGE OF UTERUS N/A 04/01/2022   Procedure: DILATATION AND CURETTAGE;  Surgeon: Shea Evans, MD;  Location: [facility];  Service: Gynecology;  Laterality: N/A;   HYSTEROSCOPY N/A 07/27/2022    Procedure: HYSTEROSCOPY WITH ENDOMETRIAL BIOPSY;  Surgeon: Fermin Schwab, MD;  Location: [facility];  Service: Gynecology;  Laterality: N/A;    Family History  Problem Relation Age of Onset   Breast cancer Mother    Allergic rhinitis Mother    Eczema Mother    Allergic rhinitis Father    Allergic rhinitis Brother     Social History   Tobacco Use   Smoking status: Never   Smokeless tobacco: Never  Vaping Use   Vaping status: Never Used  Substance Use Topics   Alcohol use: Not Currently    Comment: occasional   Drug use: Never    Allergies:  Allergies  Allergen Reactions   Dust Mite Extract Other (See Comments)    Medications Prior to Admission  Medication Sig Dispense Refill Last Dose/Taking   Azelaic Acid 15 % gel Apply 1 application  topically 2 (two) times daily as needed (ACNE).   Past Week   calcium citrate (CALCITRATE - DOSED IN MG ELEMENTAL CALCIUM) 950 (200 Ca) MG tablet Take 2 tablets by mouth daily.   12/02/2023   cholecalciferol (VITAMIN D3) 25 MCG (1000 UNIT) tablet Take 1,000 Units by mouth daily.   12/02/2023   diphenhydramine-acetaminophen (TYLENOL PM) 25-500 MG TABS tablet Take 1 tablet by mouth at bedtime as needed (SLEEP).   Past Week   lansoprazole (PREVACID) 15 MG capsule Take 15 mg by  mouth daily.   12/02/2023   loratadine (CLARITIN) 10 MG tablet Take 10 mg by mouth daily.   12/02/2023   NIFEdipine (PROCARDIA-XL/NIFEDICAL-XL) 30 MG 24 hr tablet Take 30-60 mg by mouth See admin instructions. 30 MG IN THE MORNING, 60 MG AT BEDTIME   12/02/2023   Omega-3 Fatty Acids (OMEGA-3 FISH OIL PO) Take 5,000 mg by mouth daily.   12/02/2023   Prenatal MV-Min-Fe Fum-FA-DHA (PRENATAL+DHA PO) Take 1 tablet by mouth daily.   12/02/2023   PSYLLIUM FIBER PO Take 2 capsules by mouth daily.   12/02/2023   valACYclovir (VALTREX) 500 MG tablet Take 1 tablet (500 mg total) by mouth 2 (two) times daily. 180 tablet 4 12/02/2023   OVER THE COUNTER MEDICATION  Take 1 tablet by mouth daily. Lions' mane supplement (mushroom extract)  2000 mg   Unknown   Riboflavin (B-2-400 PO) Take 1 tablet by mouth daily.   Unknown    ROS reviewed and pertinent positives and negatives as documented in HPI.  Physical Exam   Blood pressure (!) 138/96, pulse 85, temperature 98 F (36.7 C), temperature source Oral, resp. rate 19, height 5\' 5"  (1.651 m), weight (!) 137 kg, last menstrual period 10/07/2022, SpO2 94%.  Physical Exam Constitutional:      General: She is not in acute distress.    Appearance: Normal appearance. She is not ill-appearing.  HENT:     Head: Normocephalic and atraumatic.  Cardiovascular:     Rate and Rhythm: Normal rate.  Pulmonary:     Effort: Pulmonary effort is normal.     Breath sounds: Normal breath sounds.  Abdominal:     Palpations: Abdomen is soft.     Tenderness: There is no abdominal tenderness. There is no guarding.  Musculoskeletal:        General: Normal range of motion.  Skin:    General: Skin is warm and dry.     Findings: No rash.  Neurological:     General: No focal deficit present.     Mental Status: She is alert and oriented to person, place, and time.     Coordination: Coordination normal.     Gait: Gait normal.   EFM: 130/mod/+a/-d  MAU Course  Procedures  MDM 42 y.o. G3P0010 at [redacted]w[redacted]d presenting for DFM, on arrival to MAU reports FM much improved. She is well appearing, cat I strip. However, BP on arrival is high (150s/100s). Denies si/sx pre-e and exam reassuring. Will collect PEC labs.   1241 Patient with severe range BP -- 161/112. Called Dr. Wendall Stade to discuss pt. She recommends continuing close BP monitoring, will initiate Labetalol protocol if severe range on repeat  1307 Pt with second severe range BP -- updated Dr. Wendall Stade who recommends starting Labetalol protocol, rec holding off on Magnesium for now. She is on her way to eval pt.  1320 At bedside to update pt -- pt with BP of 185/114.  Denies sxs. Discussed with patient and her partner that recommendation at this time would be to proceed with delivery. Updated that Dr. Wendall Stade is en route to discuss further with them. They are requesting more info regarding opting to decline delivery at this time, alternative possibility, questions about NICU stay for baby. I recommended proceeding with delivery and offered that Dr. Wendall Stade will be in soon to further discuss  Assessment and Plan  Pre-eclampsia with severe features Started Labetalol protocol Started Magnesium Plan for admit   Sundra Aland, MD OB Fellow, [facility], [facility]  for [employer]  12/02/2023, 4:05 PM

## 2023-12-02 NOTE — Progress Notes (Signed)
Patient with persistent severe range BP's, now s/p IVP labetalol 20mg  and 40mg . On Magnesium gtt. Blood pressures remain elevated. Plan for urgent delivery via Cesarean. Reviewed case with anesthesiologist Dr. Jason Coop. Plan to proceed urgently though patient only 6.5h NPO given persistent elevated BP's. Plan reviewed with patient and she is in agreement. Risks of surgery reviewed including pain, infection, bleeding and damage to organs including bowel and bladder. Will proceed to OR when ready.  Marlene Bast, MD

## 2023-12-02 NOTE — H&P (Addendum)
OB ADMISSION/ HISTORY & PHYSICAL:  Admission Date: 12/02/2023 11:23 AM  Admit Diagnosis: DFM    Debra [name] is a 42 y.o. female G3P0010 at [redacted]w[redacted]d with h/o 20w fetal demise in prior pregnancy, AMA, IVF pregnancy, BMI >40, abdominal cerclage in situ, cHTN vs gHTN with PEC without severe features presenting for decreased fetal movement. Patient on Procardia 30/60 BID and bASA in this pregnancy. Scheduled for C/S @ 37w.   Patient initially reported no fetal movement for 13 hours, reason for initial presentation. Patient reports resumption of normal fetal movement on arrival to MAU. However, blood pressured noted to be elevated. Patient reports taking her Nifed this morning at 830a. She denies headache, visual changes, chest pain, shortness of breath, RUQ pain, contractions, vaginal bleeding, leakage of fluid, or new/worsening edema.   Patient with sustained severe range BP, just received IVP labetalol 20mg  @ 140p.   Prenatal History: G3P0010   EDC: 12/30/2023, by Other Basis  Prenatal care at [facility] since 11w  Primary: Dr. Billy Coast  Prenatal course complicated by: AMA, IVF preg, BMI >40, PEC w/o SF on procardia 30/60 BID and bASA, abdominal cerclage in situ  Prenatal Labs: ABO, Rh:   O+ Antibody:   Rubella: Immune (06/24 0000)  RPR: Nonreactive (06/24 0000)  HBsAg: Negative (06/24 0000)  HIV: Non-reactive (06/24 0000)  GBS:   neg 1 hr Glucola : 104 Genetic Screening: LR Ultrasound: wnl     Maternal Diabetes: No Genetic Screening: Normal Maternal Ultrasounds/Referrals: Normal Fetal Ultrasounds or other Referrals:  None Maternal Substance Abuse:  No Significant Maternal Medications:  Meds include: Other:  Procardia 30mg /90mg  BID Significant Maternal Lab Results:  Group B Strep negative Other Comments:  None  Medical / Surgical History : Past medical history:  Past Medical History:  Diagnosis Date   Acne    Allergic rhinitis    Cold sore 11/02/2016    Depression    GERD (gastroesophageal reflux disease)    Hearing loss of both ears    per pt right > left,  does not wear hearing aids   IDA (iron deficiency anemia)    Incompetent cervix    Infertility, female    Mild obstructive sleep apnea    per pt had sleep study was very mild osa,  did not meet critiria for insurance to pay but pt paid out of pocket since it helps due to dust mite allergy   Prior pregnancy with incompetent cervix in second trimester, antepartum 04/01/2022   03-31-2022 pre-mature ruptured membrane [redacted]wk gestation, nonviable   Uterine polyp     Past surgical history:  Past Surgical History:  Procedure Laterality Date   CERCLAGE LAPAROSCOPIC ABDOMINAL N/A 07/27/2022   Procedure: CERCLAGE LAPAROSCOPIC CERVICO- ABDOMINAL AND LYSIS OF ADHESIONS;  Surgeon: Fermin Schwab, MD;  Location: [facility];  Service: Gynecology;  Laterality: N/A;   DILATION AND CURETTAGE OF UTERUS N/A 04/01/2022   Procedure: DILATATION AND CURETTAGE;  Surgeon: Shea Evans, MD;  Location: [facility];  Service: Gynecology;  Laterality: N/A;   HYSTEROSCOPY N/A 07/27/2022   Procedure: HYSTEROSCOPY WITH ENDOMETRIAL BIOPSY;  Surgeon: Fermin Schwab, MD;  Location: [facility];  Service: Gynecology;  Laterality: N/A;    Family History:  Family History  Problem Relation Age of Onset   Breast cancer Mother    Allergic rhinitis Mother    Eczema Mother    Allergic rhinitis Father    Allergic rhinitis Brother     Social History:  reports  that she has never smoked. She has never used smokeless tobacco. She reports that she does not currently use alcohol. She reports that she does not use drugs.  Allergies: Dust mite extract   Current Medications at time of admission:  Medications Prior to Admission  Medication Sig Dispense Refill Last Dose/Taking   Azelaic Acid 15 % gel Apply 1 application  topically 2 (two) times daily as needed (ACNE).   Past Week   calcium  citrate (CALCITRATE - DOSED IN MG ELEMENTAL CALCIUM) 950 (200 Ca) MG tablet Take 2 tablets by mouth daily.   12/02/2023   cholecalciferol (VITAMIN D3) 25 MCG (1000 UNIT) tablet Take 1,000 Units by mouth daily.   12/02/2023   diphenhydramine-acetaminophen (TYLENOL PM) 25-500 MG TABS tablet Take 1 tablet by mouth at bedtime as needed (SLEEP).   Past Week   lansoprazole (PREVACID) 15 MG capsule Take 15 mg by mouth daily.   12/02/2023   loratadine (CLARITIN) 10 MG tablet Take 10 mg by mouth daily.   12/02/2023   NIFEdipine (PROCARDIA-XL/NIFEDICAL-XL) 30 MG 24 hr tablet Take 30-60 mg by mouth See admin instructions. 30 MG IN THE MORNING, 60 MG AT BEDTIME   12/02/2023   Omega-3 Fatty Acids (OMEGA-3 FISH OIL PO) Take 5,000 mg by mouth daily.   12/02/2023   Prenatal MV-Min-Fe Fum-FA-DHA (PRENATAL+DHA PO) Take 1 tablet by mouth daily.   12/02/2023   PSYLLIUM FIBER PO Take 2 capsules by mouth daily.   12/02/2023   valACYclovir (VALTREX) 500 MG tablet Take 1 tablet (500 mg total) by mouth 2 (two) times daily. 180 tablet 4 12/02/2023   OVER THE COUNTER MEDICATION Take 1 tablet by mouth daily. Lions' mane supplement (mushroom extract)  2000 mg   Unknown   Riboflavin (B-2-400 PO) Take 1 tablet by mouth daily.   Unknown     Review of Systems: Negative except as above.   Physical Exam: Vital signs and nursing notes reviewed.  Patient Vitals for the past 24 hrs:  BP Temp src Pulse Resp SpO2 Height Weight  12/02/23 1301 (!) 161/104 -- (!) 105 -- -- -- --  12/02/23 1246 (!) 153/100 -- 94 -- -- -- --  12/02/23 1231 (!) 147/100 -- 93 -- -- -- --  12/02/23 1216 (!) 161/112 -- 92 -- -- -- --  12/02/23 1204 (!) 158/100 -- 95 -- -- -- --  12/02/23 1202 (!) 158/100 Oral (!) 103 18 97 % 5\' 5"  (1.651 m) (!) 137 kg     General: AAO x 3, NAD Heart: RRR Lungs:CTAB Abdomen: Gravid, NT Extremities: no edema SVE:   deferred  FHR: 130 BPM, mod variability, + accels, - decels TOCO: No  contractions   Labs:   No  results for input(s): "WBC", "HGB", "HCT", "PLT" in the last 72 hours.  Assessment/Plan: 42 y.o. G3P0010 at [redacted]w[redacted]d with AMA, IVF preg, BMI >40, abdominal cerclage in situ, PEC w/o SF on procardia 30/60 BID and bASA now meeting criteria for preeclampsia with severe features based on severe range blood pressures requiring IV anti-hypertensive. Plan to start Magnesium gtt for seizure prophylaxis. Continue to administer and uptitrate IV labetalol as needed per protocol. Recommend delivery today. Patient hesitant about proceeding with delivery, reviewed risks of stroke, MI, and seizure, as well as guidelines for delivery by 34w for severe preeclampsia. Patient discussing with husband. Lab results pending.   Fetal wellbeing - FHT category 1  GBS neg Rubella immune Rh pos  Pain control: desires spinal epidural Analgesia/anesthesia PRN  Anticipated MOD: Cesarean  POC discussed with patient and support team, all questions answered.  Antony Salmon D'iorio, MD 12/02/2023, 1:50 PM

## 2023-12-02 NOTE — Op Note (Signed)
Cesarean Section Operative Note  Indications: severe preeclampsia and abdominal cerclage  Pre-operative Diagnosis: [redacted]w[redacted]d   Post-operative Diagnosis: same  Surgeon: Edger House, MD  Assistants: Shea Evans, MD  Anesthesia: Local anesthesia 0.25.% bupivacaine and Spinal anesthesia  Findings: Patient presented with preeclampsia with severe features, decision made for urgent delivery given continued elevated pressures despite IV anti-hypertensive medication. Primary cesarean for abdominal cerclage. Anterior uterine serosa with notable vasculature on left side, hysterotomy curved caudally to avoid vessels. Live female infant delivered at 445pm in ROA position with APGARs 8/9 weighing 2480g. Normal appearing uterus, bilateral ovaries and fallopian tubes. Minimal intraabdominal adhesions noted. Abdominal cerclage palpated in place at cervico-uterine junction. TXA and hemabate administered for intermittent atony and initial significant blood loss. Arista powder placed over incision for excellent hemostasis.   Procedure Details   The patient was seen in the pre-operative area. Risks, benefits, and alternatives of the procedure were reviewed and informed consent was signed and witnessed. Patient proceeded to operating room where spinal anesthesia was obtained without complication. SCD boots were placed and turned on. Foley catheter was placed. IV Cefazolin 3g was administered. The patient was prepped and draped in usual sterile manner.   Alis clamps were used to ensure adequate anesthesia. A Pfannenstiel incision was made with the scalpel and carried down through the subcutaneous tissue to the fascia using the Bovie. Fascial incision was made and extended transversely using the mayo scissors. The fascia was separated from the underlying rectus tissue superiorly and inferiorly both bluntly and with sharp dissection. The peritoneum was identified and entered bluntly. Peritoneal incision was extended  longitudinally. Minimal adhesions were encountered. An Alexis retractor was placed for excellent visualization.   A low transverse uterine incision was made. A female fetus was delivered in ROA at  weighing 2480g with Apgars 8/9 without complication. The cord clamping was delayed for 1 minute  and newborn handed to awaiting pediatrics team.   The uterus was exteriorized. The hysterotomy was closed using 0-vicryl in a single layer.  Normal appearing uterus, fallopian tubes, and bilateral ovaries visualized. The uterus was replaced into the abdomen and hemostasis assured. Uterine tone was noted to be moderately atonic. Hemabate and TXA  were administered. The Jon Gills was removed and gutters irrigated. The fascia was closed in a single running layer of 0-vicryl. The subcutaneous layer was irrigated and re-approximated with 2-0 plain. The skin was closed with 4-0 vicryl. A dressing of Provena wound vac was placed.   Instrument, sponge, and needle counts were correct prior the abdominal closure and at the conclusion of the case.   Estimated Blood Loss:   824 mL         Drains: None         Total IV Fluids:  1000 ml         Specimens: Placenta          Implants: Arista powder         Complications:  None; patient tolerated the procedure well.         Disposition: PACU - hemodynamically stable.         Condition: stable  Edger House, MD

## 2023-12-02 NOTE — MAU Note (Signed)
.  Debra [name] is a 42 y.o. at [redacted]w[redacted]d here in MAU reporting: DFM for about the last 13 hours.  Pt reports that she has tried drinking OJ and it still did not get baby moving.  Denies VB and LOF.    Onset of complaint: 13 hours ago Pain score: 0  Vitals:   12/02/23 1202  BP: (!) 158/100  Pulse: (!) 103  Resp: 18  SpO2: 97%     FHT:146 Lab orders placed from triage:    none

## 2023-12-02 NOTE — Transfer of Care (Signed)
Immediate Anesthesia Transfer of Care Note  Patient: Debra [name]  Procedure(s) Performed: Primary CESAREAN SECTION  Patient Location: PACU  Anesthesia Type:Spinal  Level of Consciousness: awake, alert , and oriented  Airway & Oxygen Therapy: Patient Spontanous Breathing  Post-op Assessment: Report given to RN and Post -op Vital signs reviewed and stable  Post vital signs: Reviewed and stable  Last Vitals:  Vitals Value Taken Time  BP 141/89 12/02/23 1816  Temp    Pulse 73 12/02/23 1818  Resp 19 12/02/23 1818  SpO2 98 % 12/02/23 1818  Vitals shown include unfiled device data.  Last Pain:  Vitals:   12/02/23 1444  TempSrc: Oral  PainSc:          Complications: No notable events documented.

## 2023-12-02 NOTE — Lactation Note (Signed)
This note was copied from a baby's chart. Lactation Consultation Note  Patient Name: Debra [name] [id number] Date: 12/02/2023 Age:41 hours   Attempted to visit with family but Ms. Norton is still in PACU. LC services to F/U at a later time.  Rhonna Holster S Aurthur Wingerter 12/02/2023, 7:02 PM

## 2023-12-02 NOTE — Progress Notes (Addendum)
Requested to speak with pt regarding plan for delivery.  Situation discussed with patient - PEC with severe features > 34w. Agree with recommendation for delivery, not expectant management. Pt. With known TAC. Csection pending today per Dr D'Iorio's plan of care.

## 2023-12-02 NOTE — Anesthesia Postprocedure Evaluation (Signed)
Anesthesia Post Note  Patient: Debra [name]  Procedure(s) Performed: Primary CESAREAN SECTION     Patient location during evaluation: PACU Anesthesia Type: Spinal Level of consciousness: awake and alert Pain management: pain level controlled Vital Signs Assessment: post-procedure vital signs reviewed and stable Respiratory status: spontaneous breathing, nonlabored ventilation and respiratory function stable Cardiovascular status: blood pressure returned to baseline Postop Assessment: no apparent nausea or vomiting, spinal receding, no headache and no backache Anesthetic complications: no       Shanda Howells

## 2023-12-02 NOTE — Anesthesia Preprocedure Evaluation (Signed)
Anesthesia Evaluation  Patient identified by MRN, date of birth, ID band Patient awake    Reviewed: Allergy & Precautions, NPO status , Patient's Chart, lab work & pertinent test results  History of Anesthesia Complications Negative for: history of anesthetic complications  Airway Mallampati: II  TM Distance: >3 FB Neck ROM: Full    Dental no notable dental hx.    Pulmonary sleep apnea    Pulmonary exam normal        Cardiovascular hypertension, Normal cardiovascular exam     Neuro/Psych    Depression       GI/Hepatic Neg liver ROS,GERD  ,,  Endo/Other    Class 4 obesity  Renal/GU negative Renal ROS     Musculoskeletal negative musculoskeletal ROS (+)    Abdominal   Peds  Hematology negative hematology ROS (+)   Anesthesia Other Findings Day of surgery medications reviewed with patient.  Reproductive/Obstetrics (+) Pregnancy                             Anesthesia Physical Anesthesia Plan  ASA: 3 and emergent  Anesthesia Plan: Spinal   Post-op Pain Management: Ofirmev IV (intra-op)*   Induction:   PONV Risk Score and Plan: 4 or greater and Treatment may vary due to age or medical condition, Ondansetron and Dexamethasone  Airway Management Planned: Natural Airway  Additional Equipment: None  Intra-op Plan:   Post-operative Plan:   Informed Consent: I have reviewed the patients History and Physical, chart, labs and discussed the procedure including the risks, benefits and alternatives for the proposed anesthesia with the patient or authorized representative who has indicated his/her understanding and acceptance.       Plan Discussed with: CRNA  Anesthesia Plan Comments: (Patient ate full meal at 0930 today. Discussed with OB and due to severe preE, uncontrolled BP, will proceed despite inadequate NPO status. Stephannie Peters, MD)       Anesthesia Quick Evaluation

## 2023-12-03 ENCOUNTER — Other Ambulatory Visit: Payer: Self-pay

## 2023-12-03 LAB — CBC
HCT: 36.3 % (ref 36.0–46.0)
Hemoglobin: 11.5 g/dL — ABNORMAL LOW (ref 12.0–15.0)
MCH: 26.1 pg (ref 26.0–34.0)
MCHC: 31.7 g/dL (ref 30.0–36.0)
MCV: 82.5 fL (ref 80.0–100.0)
Platelets: 266 10*3/uL (ref 150–400)
RBC: 4.4 MIL/uL (ref 3.87–5.11)
RDW: 15.8 % — ABNORMAL HIGH (ref 11.5–15.5)
WBC: 15.6 10*3/uL — ABNORMAL HIGH (ref 4.0–10.5)
nRBC: 0 % (ref 0.0–0.2)

## 2023-12-03 LAB — COMPREHENSIVE METABOLIC PANEL
ALT: 13 U/L (ref 0–44)
AST: 25 U/L (ref 15–41)
Albumin: 1.8 g/dL — ABNORMAL LOW (ref 3.5–5.0)
Alkaline Phosphatase: 84 U/L (ref 38–126)
Anion gap: 10 (ref 5–15)
BUN: 15 mg/dL (ref 6–20)
CO2: 18 mmol/L — ABNORMAL LOW (ref 22–32)
Calcium: 8 mg/dL — ABNORMAL LOW (ref 8.9–10.3)
Chloride: 102 mmol/L (ref 98–111)
Creatinine, Ser: 1.03 mg/dL — ABNORMAL HIGH (ref 0.44–1.00)
GFR, Estimated: >60 mL/min (ref >60)
Glucose, Bld: 151 mg/dL — ABNORMAL HIGH (ref 70–99)
Potassium: 4.6 mmol/L (ref 3.5–5.1)
Sodium: 130 mmol/L — ABNORMAL LOW (ref 135–145)
Total Bilirubin: 0.4 mg/dL (ref <1.2)
Total Protein: 5.2 g/dL — ABNORMAL LOW (ref 6.5–8.1)

## 2023-12-03 LAB — RPR: RPR Ser Ql: NONREACTIVE

## 2023-12-03 MED ORDER — SODIUM CHLORIDE 0.9 % IV BOLUS
500.0000 mL | Freq: Once | INTRAVENOUS | Status: AC
  Administered 2023-12-03: 500 mL via INTRAVENOUS

## 2023-12-03 MED ORDER — LABETALOL HCL 200 MG PO TABS: 200.0000 mg | ORAL_TABLET | Freq: Two times a day (BID) | ORAL | Status: DC

## 2023-12-03 MED ORDER — LACTATED RINGERS IV SOLN: INTRAVENOUS | Status: AC

## 2023-12-03 NOTE — Plan of Care (Signed)
  Problem: Education: Goal: Knowledge of disease or condition will improve Outcome: Progressing Goal: Knowledge of the prescribed therapeutic regimen will improve Outcome: Progressing   Problem: Fluid Volume: Goal: Peripheral tissue perfusion will improve Outcome: Progressing   Problem: Clinical Measurements: Goal: Complications related to disease process, condition or treatment will be avoided or minimized Outcome: Progressing   Problem: Education: Goal: Knowledge of General Education information will improve Description: Including pain rating scale, medication(s)/side effects and non-pharmacologic comfort measures Outcome: Progressing

## 2023-12-03 NOTE — Lactation Note (Signed)
This note was copied from a baby's chart. Lactation Consultation Note  Patient Name: Debra [name] [id number] Date: 12/03/2023 Age:41 hours Reason for consult: Initial assessment;1st time breastfeeding;NICU baby;Late-preterm 34-36.6wks. See MOB: MR- hx of AMA, CHTN, and C/S delivery.  MOB requested LC services, LC reviewed how to use DEBP and MOB fitted with 24 mm breast flange,. Per MOB she has used DEBP twice, LC encouraged MOB to continue to pump every 3 hours for 15 minutes on initial setting. FOB will ask NICU for infant labels. LC discussed hand expression with breast model and MOB self expressed few drops of colostrum. MOB will follow NICU infant feeding guidelines.  LC discussed importance of maternal rest, balance diet and hydration. MOB will be followed up by [facility] NICU team.   Maternal Data Has patient been taught Hand Expression?: Yes Does the patient have breastfeeding experience prior to this delivery?: No  Feeding Mother's Current Feeding Choice: Breast Milk  LATCH Score                    Lactation Tools Discussed/Used Tools: Pump Breast pump type: Double-Electric Breast Pump Pump Education: Setup, frequency, and cleaning;Milk Storage Reason for Pumping: C/S, LPTI, AMA and CHTN Pumping frequency: MOB will continue to use DEBP every 3 hours for 15 minutes on inital setting.  Interventions Interventions: Breast feeding basics reviewed;Breast compression;Hand express;DEBP;Education;LC Services brochure;CDC Guidelines for Breast Pump Cleaning  Discharge Pump: DEBP (Per MOB, she ordered her DEBP not arrived yet.)  Consult Status Consult Status: NICU follow-up Date: 12/04/23 Follow-up type: In-patient    Frederico Hamman 12/03/2023, 8:13 PM

## 2023-12-03 NOTE — Progress Notes (Signed)
Postpartum Progress Note  POD#1 s/p PCS for PEC w/SF and abdominal cerclage  S: Patient seen and examined at bedside. Reports feeling overall well. Pain well controlled, ambulating, tolerating regular diet, voiding without issue. Passing flatus, has not yet had bowel movement. Denies fever, chills, chest pain, shortness of breath, headache, visual changes, RUQ pain, new/worsening LEE.   Feeding: undecided Circ: Declines circumcision  O:  Vitals:   12/03/23 0413 12/03/23 0423  BP: (!) 146/117 126/73  Pulse: (!) 129 72  Resp:  20  Temp:    SpO2:      PE:  GA: well appearing, NAD CV: RRR, normal S1, S2 Lungs: CTAB Abd: soft, appropriately tender, fundus firm below umbilicus, no RUQ pain Inc: dressing in place, clean/dry/intact, Provena wound vac in place Peri: moderate lochia Ext: no TTP, +1 non-pitting edema  Labs:  Lab Results  Component Value Date   WBC 15.6 (H) 12/03/2023   HGB 11.5 (L) 12/03/2023   HCT 36.3 12/03/2023   MCV 82.5 12/03/2023   PLT 266 12/03/2023   Lab Results  Component Value Date   CREATININE 1.03 (H) 12/03/2023    A/P:  42 y.o.yo [id number] POD#1 s/p PCS for PEC /wSF and abdominal cerclage (EBL 824 mL) with h/o 20w fetal demise in prior pregnancy, AMA, IVF pregnancy, BMI >40, abdominal cerclage in situ, PEC w/SF on Mag gtt doing well and progressing appropriately. No signs of Mag toxicity. Vitals notable for continued elevated BP's, last SR @4a , non-sustained. Physical exam benign. Labs notable for increasing Cr to 1.03, hyponatremia to 130. Plan as follows:   #Routine OB - Regular diet, HLIV - ERAS for pain control  - Rh positive - DVT ppx: ambulating, SCDs, and Lovenox daily - BCM: Unsure  #Neonate - Declines circumcision -undecided   #PEC w/SF - Mag gtt x24h PP - Continue home Nifed 30mg  qAM and 60mg  qPM  - BP's labile - last 126/73 10 min after 146/117 > will continue to monitor closely and low threshold to add PO labetalol  - Cr  uptrending to 1.03 > possible 2/2 dehydration; will repeat in AM  #Hyponatremia - Mild, Na 130, likely iso dehydration - Will give 500cc NS bolus  Marlene Bast, MD

## 2023-12-04 ENCOUNTER — Encounter (HOSPITAL_COMMUNITY): Payer: Self-pay | Admitting: Obstetrics

## 2023-12-04 ENCOUNTER — Ambulatory Visit: Payer: 59 | Admitting: Physical Therapy

## 2023-12-04 LAB — CBC WITH DIFFERENTIAL/PLATELET
Abs Immature Granulocytes: 0.05 10*3/uL (ref 0.00–0.07)
Basophils Absolute: 0 10*3/uL (ref 0.0–0.1)
Basophils Relative: 0 %
Eosinophils Absolute: 0.1 10*3/uL (ref 0.0–0.5)
Eosinophils Relative: 1 %
HCT: 28 % — ABNORMAL LOW (ref 36.0–46.0)
Hemoglobin: 9 g/dL — ABNORMAL LOW (ref 12.0–15.0)
Immature Granulocytes: 1 %
Lymphocytes Relative: 40 %
Lymphs Abs: 3.9 10*3/uL (ref 0.7–4.0)
MCH: 26.5 pg (ref 26.0–34.0)
MCHC: 32.1 g/dL (ref 30.0–36.0)
MCV: 82.6 fL (ref 80.0–100.0)
Monocytes Absolute: 0.5 10*3/uL (ref 0.1–1.0)
Monocytes Relative: 5 %
Neutro Abs: 5.3 10*3/uL (ref 1.7–7.7)
Neutrophils Relative %: 53 %
Platelets: 208 10*3/uL (ref 150–400)
RBC: 3.39 MIL/uL — ABNORMAL LOW (ref 3.87–5.11)
RDW: 16.2 % — ABNORMAL HIGH (ref 11.5–15.5)
WBC: 9.8 10*3/uL (ref 4.0–10.5)
nRBC: 0 % (ref 0.0–0.2)

## 2023-12-04 LAB — COMPREHENSIVE METABOLIC PANEL
ALT: 12 U/L (ref 0–44)
AST: 18 U/L (ref 15–41)
Albumin: 1.7 g/dL — ABNORMAL LOW (ref 3.5–5.0)
Alkaline Phosphatase: 71 U/L (ref 38–126)
Anion gap: 8 (ref 5–15)
BUN: 17 mg/dL (ref 6–20)
CO2: 20 mmol/L — ABNORMAL LOW (ref 22–32)
Calcium: 7 mg/dL — ABNORMAL LOW (ref 8.9–10.3)
Chloride: 106 mmol/L (ref 98–111)
Creatinine, Ser: 1.06 mg/dL — ABNORMAL HIGH (ref 0.44–1.00)
GFR, Estimated: >60 mL/min (ref >60)
Glucose, Bld: 109 mg/dL — ABNORMAL HIGH (ref 70–99)
Potassium: 3.7 mmol/L (ref 3.5–5.1)
Sodium: 134 mmol/L — ABNORMAL LOW (ref 135–145)
Total Bilirubin: 0.3 mg/dL (ref <1.2)
Total Protein: 4.5 g/dL — ABNORMAL LOW (ref 6.5–8.1)

## 2023-12-04 NOTE — Plan of Care (Signed)
  Problem: Education: Goal: Knowledge of disease or condition will improve Outcome: Progressing Goal: Knowledge of the prescribed therapeutic regimen will improve Outcome: Progressing   Problem: Clinical Measurements: Goal: Complications related to disease process, condition or treatment will be avoided or minimized Outcome: Progressing   Problem: Health Behavior/Discharge Planning: Goal: Ability to manage health-related needs will improve Outcome: Progressing   Problem: Clinical Measurements: Goal: Ability to maintain clinical measurements within normal limits will improve Outcome: Progressing Goal: Will remain free from infection Outcome: Progressing

## 2023-12-04 NOTE — Lactation Note (Signed)
This note was copied from a baby's chart.  NICU Lactation Consultation Note  Patient Name: Debra [name] [id number] Date: 12/04/2023 Age:41 hours  Reason for consult: Follow-up assessment; NICU baby; 1st time breastfeeding; Other (Comment) (AMA, IVF pregnancy)  SUBJECTIVE  LC in to visit with P2 (history of fetal demise at 20w) Mom of baby "Debra [name]" in the NICU for respiratory support.  Baby is on room air and receiving gavage feedings of donor breast milk as a bridge to mother's own milk.   Mom is pumping consistently and noting moisture in the flanges, but no drops.  Reassured her that this was WNL.  Encouraged hands on pumping, provided a pumping top and RN to assist with this at next pumping in 2 hrs.    Mom has a pump on order through her insurance, and may rent a DEBP if it doesn't arrive by discharge date.  Mom aware of strength of the [employer] Symphony and best option in supporting a full milk supply.  OBJECTIVE Infant data: Mother's Current Feeding Choice: Breast Milk and Donor Milk  O2 Device: Room Air FiO2 (%): 21 %  Infant feeding assessment Scale for Readiness(Retired-Do Not Use): 3   Maternal data: [id number] C-Section, Low Transverse Has patient been taught Hand Expression?: Yes Hand Expression Comments: LC used breast model to teach hand expression Current breast feeding challenges:: NICU infant Does the patient have breastfeeding experience prior to this delivery?: No Pumping frequency: 8 times per 24 hrs Pumped volume: 0 mL (moisture noted, no drops yet) Hands-free pumping top sizes: X-Large Ch[place] Si) Risk factor for low/delayed milk supply:: C/S. LPTI, AMA and CHTN  Pump: Refer for rental (ordered a Spectra through her insurance, hasn't arrived yet)  Feeding Status: Scheduled 9-12-3-6 Feeding method: Tube/Gavage (Bolus)  Maternal: Milk volume: Normal  INTERVENTIONS/PLAN Interventions: Interventions: Breast feeding basics reviewed; Skin to skin;  Breast massage; Hand express; DEBP; Education Tools: Pump; Flanges; Hands-free pumping top Pump Education: Setup, frequency, and cleaning; Milk Storage  Plan: 1- STS with baby 2- Massage breasts and hand express 3- Pump both breasts on initiation setting every 3 hrs or 8 times per 24 hrs 4- ask for lactation prn  Consult Status: NICU follow-up NICU Follow-up type: Verify onset of copious milk; Verify absence of engorgement; Maternal D/C visit   Debra [name] 12/04/2023, 1:50 PM

## 2023-12-04 NOTE — Progress Notes (Signed)
No c/o, tol po, ambulating, +BM, uncertain about flatus, nml lochia Voids w/o difficulty Breastfeeding No h/a, vision changes, ruq pain Patient Vitals for the past 24 hrs:  BP Temp Temp src Pulse Resp SpO2  12/04/23 0351 (!) 119/58 98 F (36.7 C) Oral 80 17 --  12/03/23 2356 137/60 -- -- 84 18 --  12/03/23 2024 137/76 98.1 F (36.7 C) Oral 86 16 100 %  12/03/23 1645 -- -- -- -- 17 --  12/03/23 1600 124/68 98.4 F (36.9 C) Oral 84 17 100 %  12/03/23 1500 -- -- -- -- 16 --  12/03/23 1400 -- -- -- -- 16 --  12/03/23 1300 -- -- -- -- 16 --  12/03/23 1207 120/67 97.7 F (36.5 C) Oral 81 15 98 %  12/03/23 1100 -- -- -- -- 17 --  12/03/23 1000 -- -- -- -- 16 --   Intake/Output Summary (Last 24 hours) at 12/04/2023 0902 Last data filed at 12/04/2023 0604 Gross per 24 hour  Intake 3714.69 ml  Output 3525 ml  Net 189.69 ml     A&ox3 Rrr Ctab Abd; soft,nt,nd; fundus firm and below umb; dressing c/d/I LE: +1 edema, nt bilat     Latest Ref Rng & Units 12/04/2023    4:37 AM 12/03/2023    4:47 AM 12/02/2023    1:15 PM  CBC  WBC 4.0 - 10.5 K/uL 9.8  15.6  10.0   Hemoglobin 12.0 - 15.0 g/dL 9.0  40.9  81.1   Hematocrit 36.0 - 46.0 % 28.0  36.3  42.3   Platelets 150 - 400 K/uL 208  266  237       Latest Ref Rng & Units 12/04/2023    4:37 AM 12/03/2023    4:47 AM 12/02/2023    1:15 PM  CMP  Glucose 70 - 99 mg/dL 914  782  96   BUN 6 - 20 mg/dL 17  15  12    Creatinine 0.44 - 1.00 mg/dL 9.56  2.13  0.86   Sodium 135 - 145 mmol/L 134  130  135   Potassium 3.5 - 5.1 mmol/L 3.7  4.6  3.9   Chloride 98 - 111 mmol/L 106  102  107   CO2 22 - 32 mmol/L 20  18  19    Calcium 8.9 - 10.3 mg/dL 7.0  8.0  9.2   Total Protein 6.5 - 8.1 g/dL 4.5  5.2  5.8   Total Bilirubin <1.2 mg/dL 0.3  0.4  0.4   Alkaline Phos 38 - 126 U/L 71  84  108   AST 15 - 41 U/L 18  25  18    ALT 0 - 44 U/L 12  13  13     A/P: pod2 s/p 1ltcs (severe pre-e, abdominal cerclage) Pt is s/p 24 hr magnesium  sulfate, on procardia 60mg  xl in pm and am 30mg xl - yesterday 30mg xl was held d/t low bps; will d/c the am dose and only plan 60mg  xl in pm and follow closely Elevated creatinine -has had good UOP, will continue to follow, repeat tomorrow and follow for trending down, may be delay in normalizing from delivery Hyponatremia - nearly resolved Boy, breastfeeding, declines circumcision; in nicu, off cpap now, will work on feeds today Dvt prophylax lovenox every day, ambulation, scd Bmi 50 Post op - contin care, encourage ambulation, pain management reveiwed; encourage hydration with water

## 2023-12-04 NOTE — Progress Notes (Signed)
Patient screened out for psychosocial assessment since none of the following apply:  Psychosocial stressors documented in mother or baby's chart  Gestation less than 32 weeks  Code at delivery   Infant with anomalies Please contact the Clinical Social Worker if specific needs arise, by MOB's request, or if MOB scores greater than 9/yes to question 10 on Edinburgh Postpartum Depression Screen.  Sui Kasparek, LCSW Clinical Social Worker [facility] Cell#: [phone]     

## 2023-12-05 LAB — COMPREHENSIVE METABOLIC PANEL
ALT: 13 U/L (ref 0–44)
AST: 20 U/L (ref 15–41)
Albumin: 1.8 g/dL — ABNORMAL LOW (ref 3.5–5.0)
Alkaline Phosphatase: 62 U/L (ref 38–126)
Anion gap: 8 (ref 5–15)
BUN: 13 mg/dL (ref 6–20)
CO2: 22 mmol/L (ref 22–32)
Calcium: 8.3 mg/dL — ABNORMAL LOW (ref 8.9–10.3)
Chloride: 111 mmol/L (ref 98–111)
Creatinine, Ser: 0.81 mg/dL (ref 0.44–1.00)
GFR, Estimated: >60 mL/min (ref >60)
Glucose, Bld: 73 mg/dL (ref 70–99)
Potassium: 4.1 mmol/L (ref 3.5–5.1)
Sodium: 141 mmol/L (ref 135–145)
Total Bilirubin: 0.3 mg/dL (ref <1.2)
Total Protein: 4.9 g/dL — ABNORMAL LOW (ref 6.5–8.1)

## 2023-12-05 LAB — SURGICAL PATHOLOGY

## 2023-12-05 MED ORDER — FUROSEMIDE 40 MG PO TABS
40.0000 mg | ORAL_TABLET | Freq: Every day | ORAL | Status: DC
  Administered 2023-12-05 – 2023-12-06 (×2): 40 mg via ORAL
  Filled 2023-12-05 (×2): qty 1

## 2023-12-05 MED ORDER — NIFEDIPINE ER OSMOTIC RELEASE 30 MG PO TB24
30.0000 mg | ORAL_TABLET | Freq: Every day | ORAL | Status: DC
  Administered 2023-12-05 – 2023-12-06 (×2): 30 mg via ORAL
  Filled 2023-12-05 (×2): qty 1

## 2023-12-05 NOTE — Lactation Note (Signed)
This note was copied from a baby's chart.  NICU Lactation Consultation Note  Patient Name: Debra [name] [id number] Date: 12/05/2023 Age:41 hours  Reason for consult: Follow-up assessment; 1st time breastfeeding; NICU baby; Infant < 6lbs; Other (Comment); Late-preterm 34-36.6wks (AMA, IVF, cHTN)  SUBJECTIVE Visited with family of 106 79/45 weeks old AGA NICU female; Ms. Bendick is a P2 but this is her first time breastfeeding due to Hx of fetal demise at 20 weeks. She reported she's pumping consistently for baby " Debra [name]" and already getting small amounts of colostrum enough to collect, praised her for her efforts. She got her Spectra pump in the mail today. Reviewed pumping schedule, pump settings, pumping log, lactogenesis II/III, benefits of STS care and anticipatory guidelines.  OBJECTIVE Infant data: Mother's Current Feeding Choice: Breast Milk and Donor Milk  O2 Device: Room Air  Infant feeding assessment Scale for Readiness(Retired-Do Not Use): 3   Maternal data: [id number] C-Section, Low Transverse Pumping frequency: 6 times/24 hours Pumped volume: 4 mL (4-5 ml) Flange Size: 24 Hands-free pumping top sizes: X-Large Chilton Si)  Pump: Personal (Spectra Gold)  ASSESSMENT Infant: Feeding Status: Scheduled 9-12-3-6 Feeding method: Tube/Gavage (Bolus)  Maternal: Milk volume: Normal  INTERVENTIONS/PLAN Interventions: Interventions: Breast feeding basics reviewed; Coconut oil; DEBP; Education; NICU Pumping Log Tools: Pump; Flanges; Hands-free pumping top; Coconut oil Pump Education: Setup, frequency, and cleaning; Milk Storage  Plan: Encouraged pumping every 3 hours, ideally 8 pumping sessions/24 hours Breast massage, hand expression and coconut oil were also encouraged prior pumping STS with baby "Debra [name]" as much as possible  FOB present and supportive. All questions and concerns answered, family to contact [facility] services PRN.  Consult Status: NICU follow-up NICU  Follow-up type: Maternal D/C visit   Debra [name] 12/05/2023, 12:46 PM

## 2023-12-05 NOTE — Plan of Care (Signed)
  Problem: Education: Goal: Knowledge of disease or condition will improve Outcome: Progressing Goal: Knowledge of the prescribed therapeutic regimen will improve Outcome: Progressing   Problem: Fluid Volume: Goal: Peripheral tissue perfusion will improve Outcome: Progressing   Problem: Clinical Measurements: Goal: Complications related to disease process, condition or treatment will be avoided or minimized Outcome: Progressing   Problem: Health Behavior/Discharge Planning: Goal: Ability to manage health-related needs will improve Outcome: Progressing   Problem: Clinical Measurements: Goal: Ability to maintain clinical measurements within normal limits will improve Outcome: Progressing Goal: Will remain free from infection Outcome: Progressing Goal: Diagnostic test results will improve Outcome: Progressing Goal: Respiratory complications will improve Outcome: Progressing Goal: Cardiovascular complication will be avoided Outcome: Progressing   Problem: Pain Management: Goal: General experience of comfort will improve Outcome: Progressing   Problem: Safety: Goal: Ability to remain free from injury will improve Outcome: Progressing   Problem: Skin Integrity: Goal: Risk for impaired skin integrity will decrease Outcome: Progressing   Problem: Education: Goal: Knowledge of the prescribed therapeutic regimen will improve Outcome: Progressing Goal: Understanding of sexual limitations or changes related to disease process or condition will improve Outcome: Progressing Goal: Individualized Educational Video(s) Outcome: Progressing   Problem: Self-Concept: Goal: Communication of feelings regarding changes in body function or appearance will improve Outcome: Progressing   Problem: Skin Integrity: Goal: Demonstration of wound healing without infection will improve Outcome: Progressing   Problem: Education: Goal: Knowledge of condition will improve Outcome:  Progressing   Problem: Coping: Goal: Ability to identify and utilize available resources and services will improve Outcome: Progressing   Problem: Life Cycle: Goal: Chance of risk for complications during the postpartum period will decrease Outcome: Progressing   Problem: Role Relationship: Goal: Ability to demonstrate positive interaction with newborn will improve Outcome: Progressing   Problem: Skin Integrity: Goal: Demonstration of wound healing without infection will improve Outcome: Progressing

## 2023-12-05 NOTE — Progress Notes (Signed)
Subjective: Postpartum Day Three: Cesarean Delivery Patient doing well today. She reports pain is well managed with oral pain medication. Up and ambulating to the NICU without difficulties or dizziness. VB has been light. Spontaneously voiding and passing gas. Tolerating regular diet. Denies any CP, SOB, or HA> Has noticed increased swelling of LE today. Baby boy doing well in NICU.  Objective: Patient Vitals for the past 24 hrs:  BP Temp Temp src Pulse Resp SpO2  12/05/23 0811 (!) 142/70 98.1 F (36.7 C) Oral 82 18 --  12/05/23 0415 (!) 108/50 98.3 F (36.8 C) Oral 90 17 98 %  12/05/23 0136 (!) 120/54 98.5 F (36.9 C) Oral 91 17 98 %  12/04/23 2136 (!) 150/78 98.6 F (37 C) Oral 86 18 98 %  12/04/23 1523 (!) 144/76 98.1 F (36.7 C) Oral 84 19 98 %    Intake/Output Summary (Last 24 hours) at 12/05/2023 1321 Last data filed at 12/04/2023 1600 Gross per 24 hour  Intake 240 ml  Output 550 ml  Net -310 ml     Physical Exam:  General: alert, no distress, and morbidly obese Lochia: appropriate Uterine Fundus: firm: difficult to palpate due to body habitus Incision: healing well with Pravena in place DVT Evaluation: Calf/Ankle edema is present. +2 LE pitting edema  Recent Labs    12/03/23 0447 12/04/23 0437  HGB 11.5* 9.0*  HCT 36.3 28.0*      Latest Ref Rng & Units 12/05/2023    5:17 AM 12/04/2023    4:37 AM 12/03/2023    4:47 AM  CMP  Glucose 70 - 99 mg/dL 73  962  952   BUN 6 - 20 mg/dL 13  17  15    Creatinine 0.44 - 1.00 mg/dL 8.41  3.24  4.01   Sodium 135 - 145 mmol/L 141  134  130   Potassium 3.5 - 5.1 mmol/L 4.1  3.7  4.6   Chloride 98 - 111 mmol/L 111  106  102   CO2 22 - 32 mmol/L 22  20  18    Calcium 8.9 - 10.3 mg/dL 8.3  7.0  8.0   Total Protein 6.5 - 8.1 g/dL 4.9  4.5  5.2   Total Bilirubin <1.2 mg/dL 0.3  0.3  0.4   Alkaline Phos 38 - 126 U/L 62  71  84   AST 15 - 41 U/L 20  18  25    ALT 0 - 44 U/L 13  12  13       Assessment/Plan: Status post  Cesarean section. Doing well postoperatively.  Continue current care. Debra [name] [phone] POD#3 sp primary cesarean section at [redacted]w[redacted]d PEC SF in setting of abdominal cerclage 1. PPC: cont current PO pain regimen, encourage ambulation, regular diet as tolerated w/ bowel regimen PRN 2. PEC SF: s/p 24hr PP Mag. Normal labs with Cr down to 0.81 this morning. No severe range BP's, BP's labile-- held AM Procardia yesterday POD2 and received 60XL Procardia PM dosing with lower BP's overnight but rising BP throughout the day. Restarted Procardia 30XL this morning, consider decreasing PM dosing from 60 to 30 pending BP's throughout the day 3. LE Edema: will start Lasix 40mg  daily and monitor for improvement, encourage continue hydration, diuresing appropriately, cont I/O monitoring 4. Lovenox daily VTE ppx 5. Continue routine inpatient postpartum care  Will continue inpatient management for BP control prior to discharge  Debra [name] 12/05/2023, 1:20 PM

## 2023-12-06 MED ORDER — IBUPROFEN 600 MG PO TABS: 600.0000 mg | ORAL_TABLET | Freq: Four times a day (QID) | ORAL | 0 refills | Status: DC

## 2023-12-06 MED ORDER — OXYCODONE HCL 5 MG PO TABS: 5.0000 mg | ORAL_TABLET | ORAL | 0 refills | Status: AC | PRN

## 2023-12-06 NOTE — Progress Notes (Signed)
   12/06/23 1423  Departure Condition  Departure Condition Good  Mobility at [facility]  Patient/Caregiver Teaching Teach Back Method Used;Discharge instructions reviewed;Prescriptions reviewed;Follow-up care reviewed;Pain management discussed;Medications discussed;Patient/caregiver verbalized understanding  Departure Mode With significant other  Was procedural sedation performed on this patient during this visit? No   Patient alert and oriented x4, VS and pain stable.

## 2023-12-06 NOTE — Plan of Care (Signed)
  Problem: Education: Goal: Knowledge of disease or condition will improve Outcome: Adequate for Discharge Goal: Knowledge of the prescribed therapeutic regimen will improve Outcome: Adequate for Discharge   Problem: Fluid Volume: Goal: Peripheral tissue perfusion will improve Outcome: Adequate for Discharge   Problem: Clinical Measurements: Goal: Complications related to disease process, condition or treatment will be avoided or minimized Outcome: Adequate for Discharge   Problem: Health Behavior/Discharge Planning: Goal: Ability to manage health-related needs will improve Outcome: Adequate for Discharge   Problem: Clinical Measurements: Goal: Ability to maintain clinical measurements within normal limits will improve Outcome: Adequate for Discharge Goal: Will remain free from infection Outcome: Adequate for Discharge Goal: Diagnostic test results will improve Outcome: Adequate for Discharge Goal: Respiratory complications will improve Outcome: Adequate for Discharge Goal: Cardiovascular complication will be avoided Outcome: Adequate for Discharge   Problem: Pain Management: Goal: General experience of comfort will improve Outcome: Adequate for Discharge   Problem: Safety: Goal: Ability to remain free from injury will improve Outcome: Adequate for Discharge   Problem: Skin Integrity: Goal: Risk for impaired skin integrity will decrease Outcome: Adequate for Discharge   Problem: Education: Goal: Knowledge of the prescribed therapeutic regimen will improve Outcome: Adequate for Discharge Goal: Understanding of sexual limitations or changes related to disease process or condition will improve Outcome: Adequate for Discharge Goal: Individualized Educational Video(s) Outcome: Adequate for Discharge   Problem: Self-Concept: Goal: Communication of feelings regarding changes in body function or appearance will improve Outcome: Adequate for Discharge   Problem: Skin  Integrity: Goal: Demonstration of wound healing without infection will improve Outcome: Adequate for Discharge   Problem: Education: Goal: Knowledge of condition will improve Outcome: Adequate for Discharge   Problem: Coping: Goal: Ability to identify and utilize available resources and services will improve Outcome: Adequate for Discharge   Problem: Life Cycle: Goal: Chance of risk for complications during the postpartum period will decrease Outcome: Adequate for Discharge   Problem: Role Relationship: Goal: Ability to demonstrate positive interaction with newborn will improve Outcome: Adequate for Discharge   Problem: Skin Integrity: Goal: Demonstration of wound healing without infection will improve Outcome: Adequate for Discharge

## 2023-12-06 NOTE — Discharge Summary (Signed)
Postpartum Discharge Summary     Patient Name: Debra [name] DOB: 11/15/1981 MRN: [id number]  Date of admission: 12/02/2023 Delivery date:12/02/2023 Delivering provider: D'IORIO, Byrd Hesselbach A Date of discharge: 12/06/2023  Admitting diagnosis: Severe preeclampsia, third trimester [O14.13] Preeclampsia [O14.90] Intrauterine pregnancy: [redacted]w[redacted]d     Secondary diagnosis:  IVF pregnancy, AMA, Abdominal cerclage. Morbid obesity    Discharge diagnosis:  Preeclampsia with severe range blood pressures                                               Urgent Primary Low transverse Cesarean section due to abdominal cerclage                                         Post partum procedures: magnesium sulphate for 24 hours  Augmentation: N/A Complications: None  Hospital course: Patient presented to [facility] at 36 weeks and was diagnosed with severe range BPs and diagnosis of Preeclampsia with severe features with failed antiHTN meds to control blood pressures. Considering she was 36 weeks, delivery was advised. Other risk factors AMA at 64 yrs age, IVF, BMI 50. She had abdominal cerclage placed in 1st trimester, so mode of delivery was C-section. Magensium sulfate started for maternal seizure prevention. Patient accepted delivery and underwent Primary LTCS without complications. She had lower segment atony and received TXA and Hemabate in OR. Baby was brought to NICU for respiratory distress and mother to [facility] specialty unit. Magnesium was continued for 24 hrs, labs followed. Post op recovery was as expected, uneventful. BP was titrated, discharge meds were Procardia 30mg  XL in AM and 60mg  XL in PM. Lasix 20 mg daily started on 12/05/23 and will take for 7 days since creatinine was normalized. Pain control adequate.  Magnesium Sulfate received: Yes: Seizure prophylaxis BMZ received: No Rhophylac:N/A MMR:N/A T-DaP:Given prenatally Flu: N/A RSV Vaccine received: No Transfusion:No Immunizations  administered: Immunization History  Administered Date(s) Administered   HPV Quadrivalent 12/19/2006   Hep B, Unspecified 12/19/1996   Influenza Inj Mdck Quad Pf 09/29/2018, 08/16/2021   Influenza Split 09/22/2014   Influenza, Seasonal, Injecte, Preservative Fre 09/28/2023   Influenza,inj,Quad PF,6+ Mos 08/29/2019, 08/27/2020, 08/12/2022   Influenza-Unspecified 09/22/2014, 10/19/2016, 08/22/2018, 08/11/2021   MMR 08/27/2018   Meningococcal Acwy, Unspecified 12/20/1999   Moderna Sars-Covid-2 Vaccination 03/06/2020, 04/03/2020, 12/01/2020, 09/11/2021   Pfizer Covid-19 Vaccine Bivalent Booster 57yrs & up 08/05/2022   Rsv, Bivalent, Protein Subunit Rsvpref,pf Verdis Frederickson) 11/09/2023   Tdap 05/13/2010, 06/01/2020, 10/31/2023   Unspecified SARS-COV-2 Vaccination 09/11/2021    Physical exam  Vitals:   12/06/23 0004 12/06/23 0405 12/06/23 0750 12/06/23 1340  BP: 133/70 132/67 (!) 149/84 (!) 143/78  Pulse: 81 89 93 90  Resp: 16 16 17 18   Temp: 98.1 F (36.7 C) 98.1 F (36.7 C) 98.1 F (36.7 C) 98.1 F (36.7 C)  TempSrc: Oral Oral Oral Oral  SpO2: 98% 100% 99% 99%  Weight:      Height:       Exam per Dr Amado Nash on 12/06/23   Labs:    Latest Ref Rng & Units 12/04/2023    4:37 AM 12/03/2023    4:47 AM 12/02/2023    1:15 PM  CBC  WBC 4.0 - 10.5 K/uL 9.8  15.6  10.0  Hemoglobin 12.0 - 15.0 g/dL 9.0  16.1  09.6   Hematocrit 36.0 - 46.0 % 28.0  36.3  42.3   Platelets 150 - 400 K/uL 208  266  237     CMP     Latest Ref Rng & Units 12/05/2023    5:17 AM 12/04/2023    4:37 AM 12/03/2023    4:47 AM  CMP  Glucose 70 - 99 mg/dL 73  045  409   BUN 6 - 20 mg/dL 13  17  15    Creatinine 0.44 - 1.00 mg/dL 8.11  9.14  7.82   Sodium 135 - 145 mmol/L 141  134  130   Potassium 3.5 - 5.1 mmol/L 4.1  3.7  4.6   Chloride 98 - 111 mmol/L 111  106  102   CO2 22 - 32 mmol/L 22  20  18    Calcium 8.9 - 10.3 mg/dL 8.3  7.0  8.0   Total Protein 6.5 - 8.1 g/dL 4.9  4.5  5.2   Total Bilirubin  <1.2 mg/dL 0.3  0.3  0.4   Alkaline Phos 38 - 126 U/L 62  71  84   AST 15 - 41 U/L 20  18  25    ALT 0 - 44 U/L 13  12  13       Edinburgh Score:    12/03/2023    1:30 PM  Edinburgh Postnatal Depression Scale Screening Tool  I have been able to laugh and see the funny side of things. 1  I have looked forward with enjoyment to things. 0  I have blamed myself unnecessarily when things went wrong. 1  I have been anxious or worried for no good reason. 1  I have felt scared or panicky for no good reason. 0  Things have been getting on top of me. 1  I have been so unhappy that I have had difficulty sleeping. 0  I have felt sad or miserable. 1  I have been so unhappy that I have been crying. 0  The thought of harming myself has occurred to me. 0  Edinburgh Postnatal Depression Scale Total 5      After visit meds:  Allergies as of 12/06/2023       Reactions   Dust Mite Extract Other (See Comments)        Medication List     STOP taking these medications    Azelaic Acid 15 % gel   diphenhydramine-acetaminophen 25-500 MG Tabs tablet Commonly known as: TYLENOL PM   OVER THE COUNTER MEDICATION   valACYclovir 500 MG tablet Commonly known as: Valtrex       TAKE these medications    B-2-400 PO Take 1 tablet by mouth daily.   calcium citrate 950 (200 Ca) MG tablet Commonly known as: CALCITRATE - dosed in mg elemental calcium Take 2 tablets by mouth daily.   cholecalciferol 25 MCG (1000 UNIT) tablet Commonly known as: VITAMIN D3 Take 1,000 Units by mouth daily.   ibuprofen 600 MG tablet Commonly known as: ADVIL Take 1 tablet (600 mg total) by mouth every 6 (six) hours.   lansoprazole 15 MG capsule Commonly known as: PREVACID Take 15 mg by mouth daily.   loratadine 10 MG tablet Commonly known as: CLARITIN Take 10 mg by mouth daily.   NIFEdipine 30 MG 24 hr tablet Commonly known as: PROCARDIA-XL/NIFEDICAL-XL Take 30-60 mg by mouth See admin instructions. 30  MG IN THE MORNING, 60 MG AT BEDTIME  OMEGA-3 FISH OIL PO Take 5,000 mg by mouth daily.   oxyCODONE 5 MG immediate release tablet Commonly known as: Oxy IR/ROXICODONE Take 1-2 tablets (5-10 mg total) by mouth every 4 (four) hours as needed for severe pain (pain score 7-10).   PRENATAL+DHA PO Take 1 tablet by mouth daily.   PSYLLIUM FIBER PO Take 2 capsules by mouth daily.        Discharge home in stable condition Infant Feeding: Bottle Infant Disposition:NICU Discharge instruction: per After Visit Summary and Postpartum booklet. Activity: Advance as tolerated. Pelvic rest for 6 weeks.  Diet: low salt diet Anticipated Birth Control: Unsure Postpartum Appointment:2-3 days Additional Postpartum F/U: Postpartum Depression checkup at same visit and complete PP check at 6 wks  Future Appointments:No future appointments. Follow up Visit: Dr Billy Coast in 2 days for BP check and Pravena dressing removal     12/06/2023 Robley Fries, MD

## 2023-12-06 NOTE — Progress Notes (Signed)
Lasix called from office chart to her pharmacy

## 2023-12-06 NOTE — Progress Notes (Signed)
No c/o; pain controlled, +flatus, voids w/o difficulty, ambulating Nml lochia; tol po Breastfeeding, baby in NICU  Patient Vitals for the past 24 hrs:  BP Temp Temp src Pulse Resp SpO2  12/06/23 0405 132/67 98.1 F (36.7 C) Oral 89 16 100 %  12/06/23 0004 133/70 98.1 F (36.7 C) Oral 81 16 98 %  12/05/23 2003 (!) 146/64 98.5 F (36.9 C) Oral 93 17 99 %  12/05/23 1709 (!) 150/74 (!) 97.5 F (36.4 C) Oral 87 18 99 %  12/05/23 1340 (!) 145/73 98.4 F (36.9 C) Oral 86 18 99 %  12/05/23 0811 (!) 142/70 98.1 F (36.7 C) Oral 82 18 --   A&ox3 Rrr Ctab Abd: +bs; soft,nt,nd; fundus firm and below umb; dressing: c/d/I; provena intact LE: trace edema,nt bilat     Latest Ref Rng & Units 12/04/2023    4:37 AM 12/03/2023    4:47 AM 12/02/2023    1:15 PM  CBC  WBC 4.0 - 10.5 K/uL 9.8  15.6  10.0   Hemoglobin 12.0 - 15.0 g/dL 9.0  16.1  09.6   Hematocrit 36.0 - 46.0 % 28.0  36.3  42.3   Platelets 150 - 400 K/uL 208  266  237       Latest Ref Rng & Units 12/05/2023    5:17 AM 12/04/2023    4:37 AM 12/03/2023    4:47 AM  CMP  Glucose 70 - 99 mg/dL 73  045  409   BUN 6 - 20 mg/dL 13  17  15    Creatinine 0.44 - 1.00 mg/dL 8.11  9.14  7.82   Sodium 135 - 145 mmol/L 141  134  130   Potassium 3.5 - 5.1 mmol/L 4.1  3.7  4.6   Chloride 98 - 111 mmol/L 111  106  102   CO2 22 - 32 mmol/L 22  20  18    Calcium 8.9 - 10.3 mg/dL 8.3  7.0  8.0   Total Protein 6.5 - 8.1 g/dL 4.9  4.5  5.2   Total Bilirubin <1.2 mg/dL 0.3  0.3  0.4   Alkaline Phos 38 - 126 U/L 62  71  84   AST 15 - 41 U/L 20  18  25    ALT 0 - 44 U/L 13  12  13       A/P: pod4 s/p 1ltcs (severe pre-e, abdominal cerclage Severe pre-e: s/p 24 hr mag sulfate; had good diuresis; labs nml - creatinine normalized; bps still labile but now back on procardia 60mg  xl in pm, 30mg  xl am added yesterday; no low bps and now normal to mild range; ok for d/c home but plan close f/u and pt to check bps at home; plan f/u 2 days; pt to call  with severe range bps Obesity- encourage ambulation Post op - met milestones; plan FRI f/u for provena dressing removal (post op d6) Acute anemia d/t blood loss, asymptomatic but clinically significant; plan iron q day Abdominal cerclage RH pos RI

## 2023-12-06 NOTE — Lactation Note (Signed)
This note was copied from a baby's chart.  NICU Lactation Consultation Note  Patient Name: Debra [name] ZOXWR'U Date: 12/06/2023 Age:41 days  Reason for consult: Follow-up assessment; 1st time breastfeeding; NICU baby; Exclusive pumping and bottle feeding; Other (Comment); Late-preterm 34-36.6wks; Maternal discharge (AMA, IVF, cHTN)  SUBJECTIVE Visited with family of 67 61/5 weeks old AGA NICU female; Ms. Caponigro is pumping consistently around baby's Debra [name] feeding schedule and reported her supply continues to increase, praised her for her efforts. She's getting discharged from [facility] today. Reviewed discharge education, pump settings and the importance of consistent pumping for the prevention of engorgement and to protect her supply. She has already brought all the pump parts to Debra [name]'s room, set it up for her to use on the next pumping session. Parents have decided to focus on exclusively pumping and bottle feeding at the moment, but they are aware that [facility] services are available in case she decides taking "Debra [name]" to breast.  OBJECTIVE Infant data: Mother's Current Feeding Choice: Breast Milk and Donor Milk  O2 Device: Room Air  Maternal data: [id number] C-Section, Low Transverse Pumping frequency: 7 times/24 hours Pumped volume: 14 mL Flange Size: 24 Hands-free pumping top sizes: X-Large Chilton Si)  Pump: Personal (Spectra Gold)  ASSESSMENT Infant: Feeding Status: (S) Scheduled 8-11-2-5 Feeding method: Tube/Gavage (Bolus) Nipple Type: Nfant Extra Slow Flow (gold)  Maternal: Milk volume: Normal  INTERVENTIONS/PLAN Interventions: Interventions: Breast feeding basics reviewed; DEBP; Coconut oil; Education Discharge Education: Engorgement and breast care Tools: Pump; Flanges; Coconut oil; Hands-free pumping top Pump Education: Setup, frequency, and cleaning; Milk Storage  Plan: Encouraged to continue pumping every 3 hours, ideally 8 pumping sessions/24  hours She'll switch her pump settings from initiate to maintain mode once she starts getting 20 ml of EBM combined STS with baby "Debra [name]" as much as possible   FOB present and supportive. All questions and concerns answered, family to contact [facility] services PRN.  Consult Status: NICU follow-up NICU Follow-up type: Verify onset of copious milk; Verify absence of engorgement   Taquisha Phung S Yaxiel Minnie 12/06/2023, 11:49 AM

## 2023-12-07 ENCOUNTER — Encounter (HOSPITAL_COMMUNITY)
Admission: RE | Admit: 2023-12-07 | Discharge: 2023-12-07 | Disposition: A | Discharge status: Home / Self Care | Payer: 59 | Source: Ambulatory Visit | Attending: Family Medicine | Admitting: Family Medicine

## 2023-12-09 ENCOUNTER — Inpatient Hospital Stay (HOSPITAL_COMMUNITY): Admission: AD | Admit: 2023-12-09 | Payer: 59 | Source: Home / Self Care | Admitting: Obstetrics and Gynecology

## 2023-12-10 ENCOUNTER — Ambulatory Visit (HOSPITAL_COMMUNITY): Payer: Self-pay

## 2023-12-10 NOTE — Lactation Note (Signed)
This note was copied from a baby's chart.  NICU Lactation Consultation Note  Patient Name: Debra [name] [id number] Date: 12/10/2023 Age:40 days  Reason for consult: Weekly NICU follow-up; 1st time breastfeeding; NICU baby; Other (Comment); Early term 31-38.6wks; Infant < 6lbs; Breastfeeding assistance; Mother's request; RN request (AMA, IVF, cHTN)  SUBJECTIVE Visited with family of 61 60/43 weeks old AGA NICU female twice, the first time to check on pumping status and the second time for the 2 pm feeding assist. Ms. Soles is a P1 and reported she's been pumping consistently, her supply continues to slowly increase, praised her for her efforts. She has decided to take baby "Debra [name]" to breast. This LC took baby to the R side in football hold; he latched at the bare breast with only a few sucks, unable to relatch without using a NS # 20. Debra [name] went back to the breast with it and stayed latched for a total of 20 minutes on and off, with some isolated sucks and long pauses where NNS was the predominant pattern during this feeding. Asked NICU RN Maryruth Hancock to do a full gavage feeding after doing this first latch. Parents are still planing doing bottles, and FOB voiced they might just end up exclusively pumping and bottle feeding as they originally planned. Reviewed strategies to increase supply and anticipatory guidelines.  OBJECTIVE Infant data: Mother's Current Feeding Choice: Breast Milk and Donor Milk  O2 Device: Room Air  Infant feeding assessment IDFTS - Readiness: 2 IDFTS - Quality: 2   Maternal data: [id number] C-Section, Low Transverse Pumping frequency: 7 times/24 hours Pumped volume: 25 mL (25-30 ml; 60 ml if skipping at night) Flange Size: 24 Hands-free pumping top sizes: X-Large Chilton Si)  Pump: Personal (Spectra Gold)  ASSESSMENT Infant: Latch: Repeated attempts needed to sustain latch, nipple held in mouth throughout feeding, stimulation needed to elicit sucking  reflex. (with NS # 20; he won't sustain the latch without it) Audible Swallowing: None (residual colostrum in NS # 20) Type of Nipple: Everted at rest and after stimulation Comfort (Breast/Nipple): Soft / non-tender Hold (Positioning): Assistance needed to correctly position infant at breast and maintain latch. LATCH Score: 6  Feeding Status: Scheduled 8-11-2-5 Feeding method: Breast Nipple Type: Dr. Levert Feinstein Preemie  Maternal: Milk volume: Low  INTERVENTIONS/PLAN Interventions: Interventions: Breast feeding basics reviewed; Assisted with latch; Skin to skin; Breast massage; Hand express; Breast compression; Adjust position; Coconut oil; DEBP; Education Tools: Pump; Flanges; Coconut oil; Hands-free pumping top; Nipple Shields Pump Education: Setup, frequency, and cleaning; Milk Storage Nipple shield size: 20  Plan: Encouraged to continue pumping on maintain every 3 hours, ideally 8 pumping sessions/24 hours She'll start taking baby "Debra [name]" to breast on feeding cues around feeding times, will try STS if he doesn't latch   Parents will continue working on bottle feedings   FOB present and supportive. All questions and concerns answered, family to contact [facility] services PRN.   Consult Status: NICU follow-up NICU Follow-up type: Weekly NICU follow up   Jona Zappone S [name] 12/10/2023, 4:14 PM

## 2023-12-11 ENCOUNTER — Telehealth (HOSPITAL_COMMUNITY): Payer: Self-pay

## 2023-12-11 NOTE — Telephone Encounter (Signed)
12/11/2023 1036  Name: Debra [name] MRN: [id number] DOB: 1981-09-15  Reason for Call:  Transition of Care Hospital Discharge Call  Contact Status: Patient Contact Status: Complete  Language assistant needed:          Follow-Up Questions: Do You Have Any Concerns About Your Health As You Heal From Delivery?: No Do You Have Any Concerns About Your Infants Health?: Infant in NICU  Edinburgh Postnatal Depression Scale:  In the Past 7 Days:    PHQ2-9 Depression Scale:     Discharge Follow-up: Edinburgh score requires follow up?:  (Explained EPDS to patient and patient states that she does not want to do EPDS. She states that she is doing well emotionally.) Patient was advised of the following resources:: Breastfeeding Support Group, Support Group  Post-discharge interventions: NA  Signature  Signe Colt

## 2023-12-14 ENCOUNTER — Ambulatory Visit (HOSPITAL_COMMUNITY): Payer: Self-pay

## 2023-12-14 NOTE — Lactation Note (Signed)
This note was copied from a baby's chart.  NICU Lactation Consultation Note  Patient Name: Debra [name] ZOXWR'U Date: 12/14/2023 Age:41 days  Reason for consult: Weekly NICU follow-up; 1st time breastfeeding; NICU baby; Other (Comment); Early term 59-38.6wks; Mother's request; RN request; Infant < 6lbs (AMA, IVF, cHTN)  SUBJECTIVE Visited with family of 57 64/59 weeks old AGA NICU female; Debra [name] reports that her supply continues to slowly increase since she started trying power pumping; she's been pumping consistently and showed LC her detailed pumping log, praised her for all her efforts. She called out for lactation because she had a question regarding her nipples and flange size (see maternal assessment). Provided breast shells and advised to start sleeping without a bra at night to let her nipples air dry; she'll also try coconut oil prior pumping instead of after to help with lubrication. Reviewed lactogenesis III and its evolution (supply/demand).   OBJECTIVE Infant data: Mother's Current Feeding Choice: Breast Milk  O2 Device: Room Air  Infant feeding assessment IDFTS - Readiness: 2 IDFTS - Quality: 3   Maternal data: [id number] C-Section, Low Transverse Pumping frequency: 7 times/24 hours Pumped volume: 35 mL (35-59; 59 ml with power pumping) Flange Size: 21 (Resized to # 21 flanges on 12/14/2023) Hands-free pumping top sizes: X-Large Chilton Si)  Pump: Personal (Spectra Gold)  ASSESSMENT Infant: Feeding Status: Scheduled 8-11-2-5 Feeding method: Bottle; Tube/Gavage (Bolus) Nipple Type: Dr. Levert Feinstein Preemie  Maternal: Milk volume: Low Both nipples have typical "milk scabs" of exclusively pumping and bottle feeding moms; light erythema, no S/S of yeast at this time No pain discomfort at rest or during pumping Nipples extend almost towards the end of the flange when pumping, resized to # 21 flanges on  12/14/2023  INTERVENTIONS/PLAN Interventions: Interventions: Breast feeding basics reviewed; Coconut oil; Breast massage; DEBP; Shells; Education Tools: Pump; Flanges; Hands-free pumping top; Shells; Coconut oil Pump Education: Setup, frequency, and cleaning; Milk Storage  Plan: Encouraged to continue pumping on maintain every 3 hours, ideally 8 pumping sessions/24 hours She'll continue power pumping in the AM She'll wear her breast shells in the AM, daytime only She'll start taking baby "Debra [name]" to breast on feeding cues around feeding times, will try STS if he doesn't latch   Parents will continue advancing on bottle feedings   FOB present and supportive. All questions and concerns answered, family to contact [facility] services PRN.  Consult Status: NICU follow-up NICU Follow-up type: Weekly NICU follow up   Debra [name] S Debra [name] 12/14/2023, 12:01 PM

## 2023-12-24 ENCOUNTER — Ambulatory Visit (HOSPITAL_COMMUNITY): Payer: Self-pay

## 2023-12-24 NOTE — Lactation Note (Signed)
 This note was copied from a baby's chart.  NICU Lactation Consultation Note  Patient Name: Debra [name] Date: 12/24/2023 Age:42 wk.o.  Reason for consult: Weekly NICU follow-up; 1st time breastfeeding; NICU baby; Other (Comment); Infant < 6lbs; Term; Exclusive pumping and bottle feeding (AMA. IVF, cHTN)  SUBJECTIVE Visited with family of 23 53/4 weeks old AGA NICU female; Ms. Sachs reported that she got mastitis and started Dicloxacillin 500 mg on 12/22/2023 4 times/day for 7 days. She voiced that pain and erythema have decreased since she started her antibiotic course but noticed that her output on the affected side (L side) has also decreased despite pumping consistently. Revised strategies to prevent recurrences, discussed icing and lecithin supplements along with pumping consistently, she's waking up at least once at night. Ms. Ki politely declined an LC OP F/U; parents have decided to just pump and bottle feed after going home but she'll be working with a private LC from her OBGYN practice in case she decides taking baby to breast. No other support person at this time. All questions and concerns answered, family to contact [facility] services PRN.  OBJECTIVE Infant data: Mother's Current Feeding Choice: Breast Milk  O2 Device: Room Air  Infant feeding assessment IDFTS - Readiness: 1 IDFTS - Quality: 1   Maternal data: [id number] C-Section, Low Transverse Pumping frequency: 7 times/24 hours Pumped volume: 50 mL (50-55 ml; low 20's on L side (mastitis side) and mid 30's on R side) Flange Size: 21 Hands-free pumping top sizes: X-Large Dori)  Pump: Personal (Spectra  Gold)  ASSESSMENT Infant: Feeding Status: Ad lib Feeding method: Bottle Nipple Type: Dr. Jonna Fling Preemie  Maternal: Milk volume: Low  INTERVENTIONS/PLAN Interventions: Interventions: Breast feeding basics reviewed; Coconut oil; DEBP; Education Tools: Pump; Flanges; Hands-free pumping top;  Coconut oil Pump Education: Setup, frequency, and cleaning; Milk Storage  Plan: Consult Status: Complete   Debra [name] S Debra [name] 12/24/2023, 12:28 PM

## 2024-01-10 ENCOUNTER — Encounter: Payer: Self-pay | Admitting: Family Medicine

## 2024-01-10 DIAGNOSIS — Z1231 Encounter for screening mammogram for malignant neoplasm of breast: Secondary | ICD-10-CM

## 2024-01-10 NOTE — Telephone Encounter (Signed)
fyi

## 2024-01-22 ENCOUNTER — Encounter (HOSPITAL_BASED_OUTPATIENT_CLINIC_OR_DEPARTMENT_OTHER): Payer: Self-pay

## 2024-01-22 ENCOUNTER — Encounter: Payer: Self-pay | Admitting: Family Medicine

## 2024-01-22 ENCOUNTER — Ambulatory Visit (HOSPITAL_BASED_OUTPATIENT_CLINIC_OR_DEPARTMENT_OTHER)
Admission: RE | Admit: 2024-01-22 | Discharge: 2024-01-22 | Disposition: A | Discharge status: Home / Self Care | Payer: 59 | Source: Ambulatory Visit | Attending: Family Medicine | Admitting: Family Medicine

## 2024-01-22 DIAGNOSIS — Z1231 Encounter for screening mammogram for malignant neoplasm of breast: Secondary | ICD-10-CM | POA: Insufficient documentation

## 2024-01-22 DIAGNOSIS — E559 Vitamin D deficiency, unspecified: Secondary | ICD-10-CM

## 2024-01-22 DIAGNOSIS — Z0000 Encounter for general adult medical examination without abnormal findings: Secondary | ICD-10-CM

## 2024-01-22 DIAGNOSIS — R7401 Elevation of levels of liver transaminase levels: Secondary | ICD-10-CM

## 2024-01-30 ENCOUNTER — Other Ambulatory Visit (INDEPENDENT_AMBULATORY_CARE_PROVIDER_SITE_OTHER): Payer: 59

## 2024-01-30 ENCOUNTER — Encounter: Payer: Self-pay | Admitting: Family Medicine

## 2024-01-30 DIAGNOSIS — Z0000 Encounter for general adult medical examination without abnormal findings: Secondary | ICD-10-CM

## 2024-01-30 DIAGNOSIS — R7401 Elevation of levels of liver transaminase levels: Secondary | ICD-10-CM

## 2024-01-30 DIAGNOSIS — Z6841 Body Mass Index (BMI) 40.0 and over, adult: Secondary | ICD-10-CM

## 2024-01-30 DIAGNOSIS — E559 Vitamin D deficiency, unspecified: Secondary | ICD-10-CM

## 2024-01-30 LAB — CBC WITH DIFFERENTIAL/PLATELET
Basophils Absolute: 0 10*3/uL (ref 0.0–0.1)
Basophils Relative: 0.6 % (ref 0.0–3.0)
Eosinophils Absolute: 0.2 10*3/uL (ref 0.0–0.7)
Eosinophils Relative: 2.7 % (ref 0.0–5.0)
HCT: 37.6 % (ref 36.0–46.0)
Hemoglobin: 11.7 g/dL — ABNORMAL LOW (ref 12.0–15.0)
Lymphocytes Relative: 45.3 % (ref 12.0–46.0)
Lymphs Abs: 3.6 10*3/uL (ref 0.7–4.0)
MCHC: 31.1 g/dL (ref 30.0–36.0)
MCV: 81.3 fL (ref 78.0–100.0)
Monocytes Absolute: 0.5 10*3/uL (ref 0.1–1.0)
Monocytes Relative: 6.6 % (ref 3.0–12.0)
Neutro Abs: 3.6 10*3/uL (ref 1.4–7.7)
Neutrophils Relative %: 44.8 % (ref 43.0–77.0)
Platelets: 233 10*3/uL (ref 150.0–400.0)
RBC: 4.63 Mil/uL (ref 3.87–5.11)
RDW: 16 % — ABNORMAL HIGH (ref 11.5–15.5)
WBC: 8 10*3/uL (ref 4.0–10.5)

## 2024-01-30 LAB — VITAMIN D 25 HYDROXY (VIT D DEFICIENCY, FRACTURES): VITD: 21.49 ng/mL — ABNORMAL LOW (ref 30.00–100.00)

## 2024-01-30 LAB — COMPREHENSIVE METABOLIC PANEL
ALT: 38 U/L — ABNORMAL HIGH (ref 0–35)
AST: 24 U/L (ref 0–37)
Albumin: 3.9 g/dL (ref 3.5–5.2)
Alkaline Phosphatase: 65 U/L (ref 39–117)
BUN: 18 mg/dL (ref 6–23)
CO2: 26 meq/L (ref 19–32)
Calcium: 9.2 mg/dL (ref 8.4–10.5)
Chloride: 105 meq/L (ref 96–112)
Creatinine, Ser: 0.75 mg/dL (ref 0.40–1.20)
GFR: 98.65 mL/min (ref >60.00)
Glucose, Bld: 87 mg/dL (ref 70–99)
Potassium: 4.2 meq/L (ref 3.5–5.1)
Sodium: 139 meq/L (ref 135–145)
Total Bilirubin: 0.3 mg/dL (ref 0.2–1.2)
Total Protein: 6.5 g/dL (ref 6.0–8.3)

## 2024-01-30 LAB — TSH: TSH: 3.95 u[IU]/mL (ref 0.35–5.50)

## 2024-01-30 LAB — LIPID PANEL
Cholesterol: 186 mg/dL (ref 0–200)
HDL: 47.8 mg/dL (ref >39.00)
LDL Cholesterol: 93 mg/dL (ref 0–99)
NonHDL: 138.31
Total CHOL/HDL Ratio: 4
Triglycerides: 225 mg/dL — ABNORMAL HIGH (ref 0.0–149.0)
VLDL: 45 mg/dL — ABNORMAL HIGH (ref 0.0–40.0)

## 2024-02-05 ENCOUNTER — Encounter: Payer: Self-pay | Admitting: Family Medicine

## 2024-02-05 ENCOUNTER — Ambulatory Visit (INDEPENDENT_AMBULATORY_CARE_PROVIDER_SITE_OTHER): Payer: 59 | Admitting: Family Medicine

## 2024-02-05 VITALS — BP 136/75 | HR 68 | Ht 65.0 in | Wt 283.0 lb

## 2024-02-05 DIAGNOSIS — Z0000 Encounter for general adult medical examination without abnormal findings: Secondary | ICD-10-CM

## 2024-02-05 NOTE — Progress Notes (Signed)
 Complete physical exam  Patient: Debra [name]   DOB: 07/04/1981   43 y.o. Female  MRN: [id number]  Subjective:    Chief Complaint  Patient presents with   Annual Exam    Debra [name] is a 43 y.o. female who presents today for a complete physical exam. She reports consuming a general diet. The patient does not participate in regular exercise at present. She generally feels well. She reports sleeping well. She does not have additional problems to discuss today.   Currently lives with: husband and son Acute concerns or interim problems since last visit: no  Vision concerns: no Dental concerns: no STD concerns: no  ETOH use: no Nicotine use: no Recreational drugs/illegal substances: no   Females:  She is currently  sexually active  Contraception choices are: Mibelas OCP LMP: n/a      Most recent fall risk assessment:    02/05/2024    9:35 AM  Fall Risk   Falls in the past year? 0  Number falls in past yr: 0  Injury with Fall? 0  Risk for fall due to : No Fall Risks  Follow up Falls evaluation completed     Most recent depression screenings:    02/05/2024    9:35 AM 07/03/2023    2:26 PM  PHQ 2/9 Scores  PHQ - 2 Score 0 1  PHQ- 9 Score 4             Patient Care Team: Clayborne Dana, NP as PCP - General (Family Medicine)   Outpatient Medications Prior to Visit  Medication Sig   calcium citrate (CALCITRATE - DOSED IN MG ELEMENTAL CALCIUM) 950 (200 Ca) MG tablet Take 2 tablets by mouth daily.   cholecalciferol (VITAMIN D3) 25 MCG (1000 UNIT) tablet Take 1,000 Units by mouth daily.   lansoprazole (PREVACID) 15 MG capsule Take 15 mg by mouth daily.   loratadine (CLARITIN) 10 MG tablet Take 10 mg by mouth daily.   MIBELAS 24 FE 1-20 MG-MCG(24) CHEW Chew 1 tablet by mouth daily.   Omega-3 Fatty Acids (OMEGA-3 FISH OIL PO) Take 5,000 mg by mouth daily.   oxyCODONE (OXY IR/ROXICODONE) 5 MG immediate release tablet Take 1-2 tablets  (5-10 mg total) by mouth every 4 (four) hours as needed for severe pain (pain score 7-10).   Prenatal MV-Min-Fe Fum-FA-DHA (PRENATAL+DHA PO) Take 1 tablet by mouth daily.   PSYLLIUM FIBER PO Take 2 capsules by mouth daily.   Riboflavin (B-2-400 PO) Take 1 tablet by mouth daily.   [DISCONTINUED] ibuprofen (ADVIL) 600 MG tablet Take 1 tablet (600 mg total) by mouth every 6 (six) hours.   [DISCONTINUED] NIFEdipine (PROCARDIA-XL/NIFEDICAL-XL) 30 MG 24 hr tablet Take 30-60 mg by mouth See admin instructions. 30 MG IN THE MORNING, 60 MG AT BEDTIME   No facility-administered medications prior to visit.    ROS  All review of systems negative except what is listed in the HPI       Objective:     BP 136/75   Pulse 68   Ht 5\' 5"  (1.651 m)   Wt 283 lb (128.4 kg)   LMP 10/07/2022 (Exact Date)   SpO2 99%   BMI 47.09 kg/m    Physical Exam Vitals reviewed.  Constitutional:      General: She is not in acute distress.    Appearance: Normal appearance. She is obese. She is not ill-appearing.  HENT:     Head: Normocephalic and atraumatic.  Right Ear: Tympanic membrane normal.     Left Ear: Tympanic membrane normal.     Nose: Nose normal.     Mouth/Throat:     Mouth: Mucous membranes are moist.     Pharynx: Oropharynx is clear.  Eyes:     Extraocular Movements: Extraocular movements intact.     Conjunctiva/sclera: Conjunctivae normal.     Pupils: Pupils are equal, round, and reactive to light.  Neck:     Vascular: No carotid bruit.  Cardiovascular:     Rate and Rhythm: Normal rate and regular rhythm.     Pulses: Normal pulses.     Heart sounds: Normal heart sounds.  Pulmonary:     Effort: Pulmonary effort is normal.     Breath sounds: Normal breath sounds.  Abdominal:     General: Abdomen is flat. Bowel sounds are normal. There is no distension.     Palpations: Abdomen is soft. There is no mass.     Tenderness: There is no abdominal tenderness. There is no right CVA  tenderness, left CVA tenderness, guarding or rebound.  Genitourinary:    Comments: Deferred exam Musculoskeletal:        General: Normal range of motion.     Cervical back: Normal range of motion and neck supple. No tenderness.     Right lower leg: No edema.     Left lower leg: No edema.  Lymphadenopathy:     Cervical: No cervical adenopathy.  Skin:    General: Skin is warm and dry.     Capillary Refill: Capillary refill takes less than 2 seconds.  Neurological:     General: No focal deficit present.     Mental Status: She is alert and oriented to person, place, and time. Mental status is at baseline.  Psychiatric:        Mood and Affect: Mood normal.        Behavior: Behavior normal.        Thought Content: Thought content normal.        Judgment: Judgment normal.         No results found for any visits on 02/05/24.     Assessment & Plan:    Routine Health Maintenance and Physical Exam Discussed health promotion and safety including diet and exercise recommendations, dental health, and injury prevention. Tobacco cessation if applicable. Seat belts, sunscreen, smoke detectors, etc.    Immunization History  Administered Date(s) Administered   HPV Quadrivalent 12/19/2006   Hep B, Unspecified 12/19/1996   Influenza Inj Mdck Quad Pf 09/29/2018, 08/16/2021   Influenza Split 09/22/2014   Influenza, Seasonal, Injecte, Preservative Fre 09/28/2023   Influenza,inj,Quad PF,6+ Mos 08/29/2019, 08/27/2020, 08/12/2022   Influenza-Unspecified 09/22/2014, 10/19/2016, 08/22/2018, 08/11/2021   MMR 08/27/2018   Meningococcal Acwy, Unspecified 12/20/1999   Moderna Sars-Covid-2 Vaccination 03/06/2020, 04/03/2020, 12/01/2020, 09/11/2021   Pfizer Covid-19 Vaccine Bivalent Booster 51yrs & up 08/05/2022   Rsv, Bivalent, Protein Subunit Rsvpref,pf Verdis Frederickson) 11/09/2023   Tdap 05/13/2010, 06/01/2020, 10/31/2023   Unspecified SARS-COV-2 Vaccination 09/11/2021    Health Maintenance  Topic Date  Due   HPV VACCINES (2 - Risk 3-dose series) 01/16/2007   Hepatitis C Screening  07/02/2024 (Originally 04/24/2000)   COVID-19 Vaccine (6 - 2024-25 season) 09/14/2024 (Originally 08/20/2023)   Cervical Cancer Screening (HPV/Pap Cotest)  05/13/2024   DTaP/Tdap/Td (4 - Td or Tdap) 10/30/2033   INFLUENZA VACCINE  Completed   HIV Screening  Completed        Problem List Items Addressed This  Visit       Active Problems   Well adult exam - Primary  Labs done 01/30/24: Elevated triglycerides (not fasting may contribute) - recommend heart healthy diet, regular exercise, Omega-3 supplement. Minimally elevated ALT (liver enzyme). This has been mildly elevated in the past, but stable. We can keep an eye on this. Vitamin D is low - increase to 2,000 units of D3 daily (over-the-counter). Anemia is improving. Will continue to monitor.  TSH (thyroid) normal.      PATIENT COUNSELING:   Advised to take 1 mg of folate supplement per day if capable of pregnancy.   Sexuality: Discussed sexually transmitted diseases, partner selection, use of condoms, avoidance of unintended pregnancy, and contraceptive alternatives.    I discussed with the patient that most people either abstain from alcohol or drink within safe limits (<=14/week and <=4 drinks/occasion for males, <=7/weeks and <= 3 drinks/occasion for females) and that the risk for alcohol disorders and other health effects rises proportionally with the number of drinks per week and how often a drinker exceeds daily limits.  Discussed cessation/primary prevention of drug use and availability of treatment for abuse.   Diet: Encouraged to adjust caloric intake to maintain or achieve ideal body weight, to reduce intake of dietary saturated fat and total fat, to limit sodium intake by avoiding high sodium foods and not adding table salt, and to maintain adequate dietary potassium and calcium preferably from fresh fruits, vegetables, and low-fat dairy  products. Encouraged vitamin D 1000 units and Calcium 1300mg  or 4 servings of dairy a day.  Emphasized the importance of regular exercise.  Injury prevention: Discussed safety belts, safety helmets, smoke detector, smoking near bedding or upholstery.   Dental health: Discussed importance of regular tooth brushing, flossing, and dental visits.       Return in about 1 year (around 02/04/2025) for physical.     Clayborne Dana, NP

## 2024-04-12 ENCOUNTER — Encounter: Payer: Self-pay | Admitting: Family Medicine

## 2024-04-12 DIAGNOSIS — Z1159 Encounter for screening for other viral diseases: Secondary | ICD-10-CM

## 2024-04-12 DIAGNOSIS — Z0184 Encounter for antibody response examination: Secondary | ICD-10-CM

## 2024-04-26 ENCOUNTER — Other Ambulatory Visit (INDEPENDENT_AMBULATORY_CARE_PROVIDER_SITE_OTHER)

## 2024-04-26 DIAGNOSIS — Z1159 Encounter for screening for other viral diseases: Secondary | ICD-10-CM

## 2024-04-26 DIAGNOSIS — Z0184 Encounter for antibody response examination: Secondary | ICD-10-CM

## 2024-04-27 LAB — HEPATITIS C ANTIBODY: Hepatitis C Ab: NONREACTIVE

## 2024-04-27 LAB — MEASLES/MUMPS/RUBELLA IMMUNITY
Mumps IgG: 94.4 [AU]/ml
Rubella: 1.83 {index}
Rubeola IgG: <13.5 [AU]/ml — ABNORMAL LOW

## 2024-04-27 LAB — HEPATITIS B SURFACE ANTIBODY, QUANTITATIVE: Hep B S AB Quant (Post): 135 m[IU]/mL (ref >=10)

## 2024-04-29 ENCOUNTER — Encounter: Payer: Self-pay | Admitting: Family Medicine

## 2024-04-30 ENCOUNTER — Telehealth (INDEPENDENT_AMBULATORY_CARE_PROVIDER_SITE_OTHER): Admitting: Family Medicine

## 2024-04-30 ENCOUNTER — Encounter: Payer: Self-pay | Admitting: Family Medicine

## 2024-04-30 DIAGNOSIS — Z789 Other specified health status: Secondary | ICD-10-CM

## 2024-04-30 DIAGNOSIS — F5101 Primary insomnia: Secondary | ICD-10-CM

## 2024-04-30 MED ORDER — TRAZODONE HCL 50 MG PO TABS: 50.0000 mg | ORAL_TABLET | Freq: Every day | ORAL | 1 refills | Status: AC

## 2024-04-30 NOTE — Progress Notes (Signed)
 Virtual Video Visit via MyChart Note  I connected with  Debra [name] on 04/30/24 at 12:40 PM EDT by the video enabled telemedicine application for MyChart, and verified that I am speaking with the correct person using two identifiers.   I introduced myself as a Publishing rights manager with the practice. We discussed the limitations of evaluation and management by telemedicine and the availability of in person appointments. The patient expressed understanding and agreed to proceed.  Participating parties in this visit include: The patient and the nurse practitioner listed.  The patient is: At home I am: In the office -  Primary Care at [facility]  Subjective:    CC:  Chief Complaint  Patient presents with   Medical Management of Chronic Issues    Discuss labs    HPI: Debra [name] is a 43 y.o. year old female presenting today via MyChart today for discussion of recent titers.   Discussed the use of AI scribe software for clinical note transcription with the patient, who gave verbal consent to proceed.  History of Present Illness Debra [name] is a 43 year old female who presents with concerns about her measles immunity.  She is concerned about her measles immunity after recent blood work showed a negative titer for measles with immunity to mumps and rubella. She had a booster in 2019 due to low titers. She is unsure if her body is not responding well to the booster, as she expected the titer to remain higher. She received her childhood vaccines, including the two childhood doses of the MMR vaccine as far as she knows.  Her concern about her immunity status is heightened due to her five-month-old son, who is not yet fully vaccinated against measles.   She is currently taking trazodone  50 mg for sleep, which she has been using since prescribed by her previous primary care provider. She requests a refill for a 90-day supply to be delivered  through [facility] home delivery.          Past medical history, Surgical history, Family history not pertinant except as noted below, Social history, Allergies, and medications have been entered into the medical record, reviewed, and corrections made.   Review of Systems:  All review of systems negative except what is listed in the HPI   Objective:    General:  Speaking clearly in complete sentences. Absent shortness of breath noted.   Alert and oriented x3.   Normal judgment.  Absent acute distress.   Impression and Recommendations:    Problem List Items Addressed This Visit       Active Problems   Insomnia   Relevant Medications   traZODone  (DESYREL ) 50 MG tablet   Other Visit Diagnoses       Not immune to measles    -  Primary       Assessment & Plan Measles immunity concern Recent titer negative. Booster in 2019 did not confer immunity. Second booster recommended as majority of adults become immune after 2nd dose. - Schedule nurse visit for MMR vaccine x1 at your convenience.  - Patient would like to recheck measles titers 4-6 weeks post-booster.  - She is working with her pediatrician regarding plans for vaccinating her child to ensure  Insomnia Requests trazodone  50 mg refill for sleep. - Refill trazodone  50 mg, 90-day supply, to [facility] home delivery.       Follow-up if symptoms worsen or fail to improve.    I discussed the assessment and  treatment plan with the patient. The patient was provided an opportunity to ask questions and all were answered. The patient agreed with the plan and demonstrated an understanding of the instructions.   The patient was advised to call back or seek an in-person evaluation if the symptoms worsen or if the condition fails to improve as anticipated.   Everlina Hock, NP

## 2024-05-03 ENCOUNTER — Ambulatory Visit (INDEPENDENT_AMBULATORY_CARE_PROVIDER_SITE_OTHER)

## 2024-05-03 DIAGNOSIS — Z23 Encounter for immunization: Secondary | ICD-10-CM | POA: Diagnosis not present

## 2024-05-03 NOTE — Progress Notes (Signed)
 Patient received MMR vaccine today in Left arm.  Measles immunity concern Recent titer negative. Booster in 2019 did not confer immunity. Second booster recommended as majority of adults become immune after 2nd dose. - Schedule nurse visit for MMR vaccine x1 at your convenience.  - Patient would like to recheck measles titers 4-6 weeks post-booster.

## 2024-05-22 ENCOUNTER — Encounter: Payer: Self-pay | Admitting: Family Medicine

## 2024-05-22 DIAGNOSIS — Z0184 Encounter for antibody response examination: Secondary | ICD-10-CM

## 2024-05-22 NOTE — Telephone Encounter (Signed)
 Pended, okay to order?

## 2024-06-28 ENCOUNTER — Other Ambulatory Visit (INDEPENDENT_AMBULATORY_CARE_PROVIDER_SITE_OTHER)

## 2024-06-28 DIAGNOSIS — Z0184 Encounter for antibody response examination: Secondary | ICD-10-CM | POA: Diagnosis not present

## 2024-06-30 LAB — MEASLES/MUMPS/RUBELLA IMMUNITY
Mumps IgG: 162 [AU]/ml
Rubella: 3.74 {index}
Rubeola IgG: 139 [AU]/ml

## 2024-07-01 ENCOUNTER — Ambulatory Visit: Payer: Self-pay | Admitting: Family Medicine

## 2024-08-29 ENCOUNTER — Ambulatory Visit: Payer: Self-pay

## 2024-08-29 NOTE — Telephone Encounter (Signed)
 Appt scheduled

## 2024-08-29 NOTE — Telephone Encounter (Signed)
 FYI Only or Action Required?: FYI only for provider.  Patient was last seen in primary care on 04/30/2024 by [name], NP.  Called Nurse Triage reporting Stye.  Symptoms began a week ago.  Interventions attempted: Prescription medications: ocutane and Ice/heat application.  Symptoms are: gradually worsening.  Triage Disposition: See PCP When Office is Open (Within 3 Days)  Patient/caregiver understands and will follow disposition?: Yes   Copied from CRM [id number]. Topic: Clinical - Red Word Triage >> Aug 29, 2024 10:46 AM Mia F wrote: Red Word that prompted transfer to Nurse Triage: Headache and a possible stye on the left eye. Bnoth started about a week ago. The headache has been every day and taking medication does not help. The headache started before the stye came. Stye comes often but this time it has not went away. Eye hurts with movement and is very swollen. No blurred vision. Reason for Disposition  [1] After 5 days of treatment per [facility] Advice AND [2] not better  Answer Assessment - Initial Assessment Questions 1. LOCATION: Which eye has the sty? Upper or lower eyelid?     Left eye, in corner  2. SIZE: How big is it? (Note: standard pencil eraser is 6 mm)     Oblonged shape, half size of green pea  3. EYELID: Is the eyelid swollen? If Yes, ask: How much?     Mild swelling  4. REDNESS: Has the redness spread onto the eyelid?     Some redness and tenderness  5. ONSET: When did you notice the sty?     1 week ago'  6. VISION: Do you have blurred vision?      No  7. PAIN: Is it painful? If Yes, ask: How bad is the pain?  (Scale 1-10; or mild, moderate, severe)     Mild  8. CONTACTS: Do you wear contacts?     No  9. OTHER SYMPTOMS: Do you have any other symptoms? (e.g., fever)     Headache, daily off/on for a week and a half. Dull constant. Unsure if its related. Not fully relieved with Excedrin  10. PREGNANCY: Is there any chance  you are pregnant? When was your last menstrual period?       LMP- curently on cycle  Has been using ocutane eye drops  Protocols used: Sty-A-AH

## 2024-08-30 ENCOUNTER — Ambulatory Visit: Admitting: Family

## 2024-08-30 VITALS — BP 124/72 | HR 80 | Ht 65.0 in | Wt 299.6 lb

## 2024-08-30 DIAGNOSIS — Z23 Encounter for immunization: Secondary | ICD-10-CM | POA: Diagnosis not present

## 2024-08-30 DIAGNOSIS — H00015 Hordeolum externum left lower eyelid: Secondary | ICD-10-CM | POA: Diagnosis not present

## 2024-08-30 DIAGNOSIS — R519 Headache, unspecified: Secondary | ICD-10-CM

## 2024-08-30 MED ORDER — FLUCONAZOLE 150 MG PO TABS: ORAL_TABLET | ORAL | 0 refills | Status: AC

## 2024-08-30 MED ORDER — DOXYCYCLINE HYCLATE 100 MG PO TABS: 100.0000 mg | ORAL_TABLET | Freq: Two times a day (BID) | ORAL | 0 refills | Status: AC

## 2024-08-30 MED ORDER — ESTARYLLA 0.25-35 MG-MCG PO TABS: 1.0000 | ORAL_TABLET | Freq: Every day | ORAL | Status: AC

## 2024-08-30 NOTE — Addendum Note (Signed)
 Addended by: ORVIN HARLENE HERO on: 08/30/2024 03:05 PM   Modules accepted: Orders

## 2024-08-30 NOTE — Progress Notes (Signed)
 Debra [name] is a 43 y.o. female with the following history as recorded in EpicCare:  Patient Active Problem List   Diagnosis Date Noted   Severe preeclampsia, third trimester 12/02/2023   Preeclampsia 12/02/2023   Sleep apnea-like behavior 09/19/2023   Anemia of pregnancy 07/14/2023   Pregnancy 07/03/2023   Pharyngitis 09/18/2022   Well adult exam 06/08/2022   Preterm delivery 04/01/2022   Incompetent cervix in pregnancy, antepartum, second trimester 04/01/2022   Pregnancy resulting from in vitro fertilization in second trimester 03/31/2022   Premature rupture of membranes in second trimester 03/31/2022   Allergic conjunctivitis of both eyes 12/16/2020   Allergic conjunctivitis 09/29/2020   Insomnia 07/18/2018   Elevated ALT measurement 07/18/2018   Snoring 01/03/2017   Chronic low back pain without sciatica 01/03/2017   Vitamin D  deficiency 11/02/2016   Morbid obesity with BMI of 40.0-44.9, adult (HCC) 11/02/2016   Dermatographia 11/02/2016   Cold sore 11/02/2016   Perennial allergic rhinitis 11/02/2016   Acne 11/02/2016   Depressive disorder 11/25/2014   Herpes simplex 12/02/2013   Esophageal reflux 12/02/2013    Current Outpatient Medications  Medication Sig Dispense Refill   calcium  citrate (CALCITRATE - DOSED IN MG ELEMENTAL CALCIUM ) 950 (200 Ca) MG tablet Take 2 tablets by mouth daily.     cholecalciferol  (VITAMIN D3) 25 MCG (1000 UNIT) tablet Take 1,000 Units by mouth daily.     doxycycline  (VIBRA -TABS) 100 MG tablet Take 1 tablet (100 mg total) by mouth 2 (two) times daily. 14 tablet 0   fluconazole  (DIFLUCAN ) 150 MG tablet Take 1 tablet today as directed; repeat after 72 hours 2 tablet 0   lansoprazole (PREVACID) 15 MG capsule Take 15 mg by mouth daily.     loratadine  (CLARITIN ) 10 MG tablet Take 10 mg by mouth daily.     Omega-3 Fatty Acids  (OMEGA-3 FISH OIL  PO) Take 5,000 mg by mouth daily.     oxyCODONE  (OXY IR/ROXICODONE ) 5 MG immediate release  tablet Take 1-2 tablets (5-10 mg total) by mouth every 4 (four) hours as needed for severe pain (pain score 7-10). 30 tablet 0   Prenatal MV-Min-Fe Fum-FA-DHA (PRENATAL+DHA PO) Take 1 tablet by mouth daily.     PSYLLIUM FIBER PO Take 2 capsules by mouth daily.     Riboflavin (B-2-400 PO) Take 1 tablet by mouth daily.     traZODone  (DESYREL ) 50 MG tablet Take 1 tablet (50 mg total) by mouth at bedtime. 90 tablet 1   ESTARYLLA  0.25-35 MG-MCG tablet Take 1 tablet by mouth daily.     No current facility-administered medications for this visit.    Allergies: Dust mite extract  Past Medical History:  Diagnosis Date   Acne    Allergic rhinitis    Cold sore 11/02/2016   Depression    GERD (gastroesophageal reflux disease)    Hearing loss of both ears    per pt right > left,  does not wear hearing aids   IDA (iron  deficiency anemia)    Incompetent cervix    Infertility, female    Mild obstructive sleep apnea    per pt had sleep study was very mild osa,  did not meet critiria for insurance to pay but pt paid out of pocket since it helps due to dust mite allergy   Prior pregnancy with incompetent cervix in second trimester, antepartum 04/01/2022   03-31-2022 pre-mature ruptured membrane [redacted]wk gestation, nonviable   Uterine polyp     Past Surgical History:  Procedure Laterality  Date   CERCLAGE LAPAROSCOPIC ABDOMINAL N/A 07/27/2022   Procedure: CERCLAGE LAPAROSCOPIC CERVICO- ABDOMINAL AND LYSIS OF ADHESIONS;  Surgeon: Yalcinkaya, Tamer, MD;  Location: [facility];  Service: Gynecology;  Laterality: N/A;   CESAREAN SECTION N/A 12/02/2023   Procedure: Primary CESAREAN SECTION;  Surgeon: D'Iorio, Hadassah LABOR, MD;  Location: [facility];  Service: Obstetrics;  Laterality: N/A;  EDD: 12/30/23   DILATION AND CURETTAGE OF UTERUS N/A 04/01/2022   Procedure: DILATATION AND CURETTAGE;  Surgeon: Barbette Knock, MD;  Location: [facility];  Service: Gynecology;  Laterality: N/A;   HYSTEROSCOPY N/A  07/27/2022   Procedure: HYSTEROSCOPY WITH ENDOMETRIAL BIOPSY;  Surgeon: Yalcinkaya, Tamer, MD;  Location: [facility];  Service: Gynecology;  Laterality: N/A;    Family History  Problem Relation Age of Onset   Breast cancer Mother    Allergic rhinitis Mother    Eczema Mother    Allergic rhinitis Father    Allergic rhinitis Brother     Social History   Tobacco Use   Smoking status: Never   Smokeless tobacco: Never  Substance Use Topics   Alcohol use: Not Currently    Comment: occasional    Subjective:   Presents with concerns for dull headache/ stye; describes as dull and constant. Does have a history of tension headache;  Currently on period/ trying to sort out hormones; headache started right after starting sugar pills;  Also concerned about stye left eye- prone to recurrent styes; would like oral Doxycycline  if possible;     Objective:  Vitals:   08/30/24 1305  BP: 124/72  Pulse: 80  SpO2: 98%  Weight: 299 lb 9.6 oz (135.9 kg)  Height: 5' 5 (1.651 m)    General: Well developed, well nourished, in no acute distress  Skin : Warm and dry.  Head: Normocephalic and atraumatic  Eyes: Sclera and conjunctiva clear; pupils round and reactive to light; extraocular movements intact  Ears: External normal; canals clear; tympanic membranes normal  Oropharynx: Pink, supple. No suspicious lesions  Neck: Supple without thyromegaly, adenopathy  Lungs: Respirations unlabored; clear to auscultation bilaterally without wheeze, rales, rhonchi  CVS exam: normal rate and regular rhythm.  Neurologic: Alert and oriented; speech intact; face symmetrical; moves all extremities well; CNII-XII intact without focal deficit   Assessment:  1. Hordeolum externum of left lower eyelid   2. Nonintractable headache, unspecified chronicity pattern, unspecified headache type     Plan:  Rx for Doxycycline  100 mg bid x 7 days; Suspect hormonal related- patient is adjusting to birth  control and currently on period; monitor to see if headache improves once she re-starts her active pills; follow up if symptoms persist.   No follow-ups on file.  No orders of the defined types were placed in this encounter.   Requested Prescriptions   Signed Prescriptions Disp Refills   ESTARYLLA  0.25-35 MG-MCG tablet      Sig: Take 1 tablet by mouth daily.   doxycycline  (VIBRA -TABS) 100 MG tablet 14 tablet 0    Sig: Take 1 tablet (100 mg total) by mouth 2 (two) times daily.   fluconazole  (DIFLUCAN ) 150 MG tablet 2 tablet 0    Sig: Take 1 tablet today as directed; repeat after 72 hours

## 2024-10-16 ENCOUNTER — Encounter: Payer: Self-pay | Admitting: Family Medicine

## 2024-10-16 MED ORDER — FLUTICASONE PROPIONATE 50 MCG/ACT NA SUSP
2.0000 | Freq: Every day | NASAL | 6 refills | Status: AC
Start: 2024-10-16 — End: ?

## 2024-10-16 NOTE — Telephone Encounter (Signed)
 This isn't on her current medication list, please advise.

## 2024-10-25 ENCOUNTER — Telehealth (INDEPENDENT_AMBULATORY_CARE_PROVIDER_SITE_OTHER): Admitting: Sports Medicine

## 2024-10-25 ENCOUNTER — Encounter: Payer: Self-pay | Admitting: Family Medicine

## 2024-10-25 DIAGNOSIS — R0982 Postnasal drip: Secondary | ICD-10-CM | POA: Diagnosis not present

## 2024-10-25 DIAGNOSIS — R052 Subacute cough: Secondary | ICD-10-CM | POA: Diagnosis not present

## 2024-10-25 DIAGNOSIS — J302 Other seasonal allergic rhinitis: Secondary | ICD-10-CM | POA: Diagnosis not present

## 2024-10-25 DIAGNOSIS — R051 Acute cough: Secondary | ICD-10-CM

## 2024-10-25 MED ORDER — BENZONATATE 200 MG PO CAPS: 200.0000 mg | ORAL_CAPSULE | Freq: Two times a day (BID) | ORAL | 0 refills | Status: AC | PRN

## 2024-10-25 MED ORDER — MONTELUKAST SODIUM 10 MG PO TABS: 10.0000 mg | ORAL_TABLET | Freq: Every day | ORAL | 3 refills | Status: AC

## 2024-10-25 MED ORDER — HYDROCOD POLI-CHLORPHE POLI ER 10-8 MG/5ML PO SUER: 5.0000 mL | Freq: Two times a day (BID) | ORAL | 0 refills | Status: AC | PRN

## 2024-10-25 NOTE — Telephone Encounter (Signed)
 Copied from CRM [id number]. Topic: Clinical - Medication Question >> Oct 25, 2024 11:25 AM Berneda FALCON wrote: Reason for CRM: Patient called in expressing frustration that she had to pay a $50 copay to meet with a different PCP (her PCP was not available) and that this PCP has a personal preference to not use the medication that the patient is requesting.  Patient states she has used this medication in the past and wanted to be prescribed it again for this cough and congestion she has been experiencing.  I was unable to locate the name of the medication in her med list but she states this was a cough syrup with codine in it  States she will have an appt with Almarie if needed, but that she has to pay another copay for this, which is unfair.  Please call patient back at [phone]

## 2024-10-25 NOTE — Progress Notes (Signed)
 Start time 11.20  End time  11.26  Virtual Visit via Video Note  I connected with Sheza Jordan Burridge on 10/25/24 at 11:00 AM EST by a video enabled telemedicine application and verified that I am speaking with the correct person using two identifiers.  Location: Patient: Debra [name] Provider: Dr Sherlynn   I discussed the limitations of evaluation and management by telemedicine and the availability of in person appointments. The patient expressed understanding and agreed to proceed.  History of Present Illness:   43 year old female was evaluated for a virtual visit for   cough and postnasal drainage.  She has been experiencing a cough for the last couple of nights, accompanied by postnasal drainage. No fever, shortness of breath, ear pain, ear drainage, or sore throat. Denies sinus pain, dizziness or lightheadedness. She has been using Flonase  daily, claritin  daily.  She has a history of allergies, particularly around this time of the year, and has been consistently taking Flonase  and Claritin . The postnasal drainage has been ongoing for about a month and a half.  No history of asthma, COPD, or smoking  Review of Systems  Constitutional:  Negative for chills and fever.  HENT:  Negative for congestion and sore throat.   Respiratory:  Positive for cough. Negative for sputum production and shortness of breath.   Cardiovascular:  Negative for chest pain and palpitations.  Gastrointestinal:  Negative for abdominal pain, heartburn and nausea.  Genitourinary:  Negative for dysuria, frequency and hematuria.  Musculoskeletal:  Negative for falls and myalgias.  Neurological:  Negative for dizziness.      Observations/Objective: Pt is able to speak in full sentences and does not appear to be in distress  Assessment and Plan: Cough  Pt denies cough, fevers, sob  C/o post nasal drip  Pt requesting prescription for codeine  Instructed patient to take tessalon 200mg  tab  twice daily  Use salt water  gargling    Seasonal allergies  Post nasal drip  Instructed to take flonase , claritin   Will add singulair    Follow Up Instructions: follow up with PCP if no improvement    I discussed the assessment and treatment plan with the patient. The patient was provided an opportunity to ask questions and all were answered. The patient agreed with the plan and demonstrated an understanding of the instructions.   The patient was advised to call back or seek an in-person evaluation if the symptoms worsen or if the condition fails to improve as anticipated.  I provided 6  minutes of non-face-to-face time during this encounter.   Jackalyn Sherlynn, MD

## 2025-01-11 ENCOUNTER — Other Ambulatory Visit: Payer: Self-pay | Admitting: Family Medicine

## 2025-01-11 DIAGNOSIS — B009 Herpesviral infection, unspecified: Secondary | ICD-10-CM

## 2025-01-13 NOTE — Telephone Encounter (Signed)
 Not on current list, please review.

## 2025-02-10 ENCOUNTER — Encounter: Admitting: Family Medicine
